# Patient Record
Sex: Female | Born: 1955
Health system: Southern US, Community
[De-identification: ages and names within clinical notes are randomized; demographics above are authoritative.]

## PROBLEM LIST (undated history)

## (undated) DIAGNOSIS — R7303 Prediabetes: Secondary | ICD-10-CM

## (undated) DIAGNOSIS — E785 Hyperlipidemia, unspecified: Secondary | ICD-10-CM

## (undated) DIAGNOSIS — J069 Acute upper respiratory infection, unspecified: Secondary | ICD-10-CM

## (undated) DIAGNOSIS — D649 Anemia, unspecified: Secondary | ICD-10-CM

## (undated) DIAGNOSIS — K589 Irritable bowel syndrome without diarrhea: Secondary | ICD-10-CM

## (undated) DIAGNOSIS — I10 Essential (primary) hypertension: Secondary | ICD-10-CM

## (undated) DIAGNOSIS — T7840XA Allergy, unspecified, initial encounter: Secondary | ICD-10-CM

## (undated) DIAGNOSIS — L309 Dermatitis, unspecified: Secondary | ICD-10-CM

## (undated) DIAGNOSIS — M47817 Spondylosis without myelopathy or radiculopathy, lumbosacral region: Secondary | ICD-10-CM

## (undated) DIAGNOSIS — J45909 Unspecified asthma, uncomplicated: Secondary | ICD-10-CM

## (undated) DIAGNOSIS — F411 Generalized anxiety disorder: Secondary | ICD-10-CM

## (undated) DIAGNOSIS — G932 Benign intracranial hypertension: Secondary | ICD-10-CM

## (undated) DIAGNOSIS — S3992XA Unspecified injury of lower back, initial encounter: Secondary | ICD-10-CM

## (undated) HISTORY — DX: Morbid (severe) obesity due to excess calories: E66.01

## (undated) HISTORY — DX: Unspecified asthma, uncomplicated: J45.909

## (undated) HISTORY — DX: Anemia, unspecified: D64.9

## (undated) HISTORY — DX: Allergy, unspecified, initial encounter: T78.40XA

## (undated) HISTORY — DX: Generalized anxiety disorder: F41.1

## (undated) HISTORY — DX: Spondylosis without myelopathy or radiculopathy, lumbosacral region: M47.817

## (undated) HISTORY — DX: Irritable bowel syndrome, unspecified: K58.9

## (undated) HISTORY — DX: Acute upper respiratory infection, unspecified: J06.9

## (undated) HISTORY — DX: Dermatitis, unspecified: L30.9

## (undated) HISTORY — DX: Benign intracranial hypertension: G93.2

## (undated) HISTORY — DX: Prediabetes: R73.03

## (undated) HISTORY — DX: Essential (primary) hypertension: I10

## (undated) HISTORY — DX: Hyperlipidemia, unspecified: E78.5

## (undated) HISTORY — PX: ABSCESS DRAINAGE: SHX1119

---

## 1998-05-11 ENCOUNTER — Other Ambulatory Visit: Admission: RE | Admit: 1998-05-11 | Discharge: 1998-05-11 | Payer: Self-pay | Admitting: Obstetrics & Gynecology

## 1999-01-11 ENCOUNTER — Ambulatory Visit (HOSPITAL_COMMUNITY): Admission: RE | Admit: 1999-01-11 | Discharge: 1999-01-11 | Payer: Self-pay | Admitting: Neurology

## 1999-01-11 ENCOUNTER — Encounter: Payer: Self-pay | Admitting: Neurology

## 1999-04-14 ENCOUNTER — Encounter: Admission: RE | Admit: 1999-04-14 | Discharge: 1999-07-13 | Payer: Self-pay | Admitting: Neurology

## 1999-07-05 ENCOUNTER — Other Ambulatory Visit: Admission: RE | Admit: 1999-07-05 | Discharge: 1999-07-05 | Payer: Self-pay | Admitting: Obstetrics and Gynecology

## 1999-07-17 ENCOUNTER — Encounter: Admission: RE | Admit: 1999-07-17 | Discharge: 1999-10-15 | Payer: Self-pay | Admitting: Neurology

## 2000-02-21 ENCOUNTER — Encounter: Admission: RE | Admit: 2000-02-21 | Discharge: 2000-05-21 | Payer: Self-pay | Admitting: Neurology

## 2000-02-29 ENCOUNTER — Ambulatory Visit: Admission: RE | Admit: 2000-02-29 | Discharge: 2000-02-29 | Payer: Self-pay | Admitting: Pulmonary Disease

## 2000-07-19 ENCOUNTER — Other Ambulatory Visit: Admission: RE | Admit: 2000-07-19 | Discharge: 2000-07-19 | Payer: Self-pay | Admitting: Obstetrics and Gynecology

## 2001-06-05 ENCOUNTER — Ambulatory Visit (HOSPITAL_COMMUNITY): Admission: RE | Admit: 2001-06-05 | Discharge: 2001-06-05 | Payer: Self-pay | Admitting: Obstetrics and Gynecology

## 2001-06-05 ENCOUNTER — Encounter: Payer: Self-pay | Admitting: Obstetrics and Gynecology

## 2001-08-07 ENCOUNTER — Other Ambulatory Visit: Admission: RE | Admit: 2001-08-07 | Discharge: 2001-08-07 | Payer: Self-pay | Admitting: Obstetrics and Gynecology

## 2002-08-21 ENCOUNTER — Other Ambulatory Visit: Admission: RE | Admit: 2002-08-21 | Discharge: 2002-08-21 | Payer: Self-pay | Admitting: Obstetrics and Gynecology

## 2003-09-10 ENCOUNTER — Other Ambulatory Visit: Admission: RE | Admit: 2003-09-10 | Discharge: 2003-09-10 | Payer: Self-pay | Admitting: Obstetrics and Gynecology

## 2004-10-18 ENCOUNTER — Other Ambulatory Visit: Admission: RE | Admit: 2004-10-18 | Discharge: 2004-10-18 | Payer: Self-pay | Admitting: Obstetrics and Gynecology

## 2004-10-31 ENCOUNTER — Ambulatory Visit: Payer: Self-pay | Admitting: Pulmonary Disease

## 2004-11-22 ENCOUNTER — Ambulatory Visit: Payer: Self-pay | Admitting: Pulmonary Disease

## 2004-11-23 ENCOUNTER — Ambulatory Visit: Payer: Self-pay | Admitting: Pulmonary Disease

## 2005-02-27 ENCOUNTER — Ambulatory Visit: Payer: Self-pay | Admitting: Gastroenterology

## 2005-03-02 ENCOUNTER — Ambulatory Visit: Payer: Self-pay | Admitting: Cardiology

## 2005-03-09 ENCOUNTER — Ambulatory Visit: Payer: Self-pay | Admitting: Gastroenterology

## 2005-03-14 ENCOUNTER — Ambulatory Visit: Payer: Self-pay | Admitting: Gastroenterology

## 2005-05-03 ENCOUNTER — Ambulatory Visit: Payer: Self-pay | Admitting: Pulmonary Disease

## 2005-12-25 ENCOUNTER — Ambulatory Visit: Payer: Self-pay | Admitting: Pulmonary Disease

## 2006-02-05 ENCOUNTER — Ambulatory Visit: Payer: Self-pay | Admitting: Pulmonary Disease

## 2006-02-08 ENCOUNTER — Ambulatory Visit: Payer: Self-pay | Admitting: Pulmonary Disease

## 2006-06-13 ENCOUNTER — Ambulatory Visit: Payer: Self-pay | Admitting: Pulmonary Disease

## 2006-10-29 ENCOUNTER — Ambulatory Visit: Payer: Self-pay | Admitting: Pulmonary Disease

## 2006-10-29 ENCOUNTER — Ambulatory Visit (HOSPITAL_COMMUNITY): Admission: RE | Admit: 2006-10-29 | Discharge: 2006-10-29 | Payer: Self-pay | Admitting: Pulmonary Disease

## 2006-12-12 ENCOUNTER — Ambulatory Visit: Payer: Self-pay | Admitting: Pulmonary Disease

## 2007-02-02 ENCOUNTER — Encounter: Admission: RE | Admit: 2007-02-02 | Discharge: 2007-02-02 | Payer: Self-pay | Admitting: Sports Medicine

## 2007-11-04 ENCOUNTER — Telehealth (INDEPENDENT_AMBULATORY_CARE_PROVIDER_SITE_OTHER): Payer: Self-pay | Admitting: *Deleted

## 2007-11-25 DIAGNOSIS — I10 Essential (primary) hypertension: Secondary | ICD-10-CM | POA: Insufficient documentation

## 2007-11-26 ENCOUNTER — Ambulatory Visit: Payer: Self-pay | Admitting: Pulmonary Disease

## 2007-11-26 DIAGNOSIS — R7309 Other abnormal glucose: Secondary | ICD-10-CM | POA: Insufficient documentation

## 2007-11-26 DIAGNOSIS — F411 Generalized anxiety disorder: Secondary | ICD-10-CM | POA: Insufficient documentation

## 2007-11-26 DIAGNOSIS — R51 Headache: Secondary | ICD-10-CM | POA: Insufficient documentation

## 2007-11-26 DIAGNOSIS — R519 Headache, unspecified: Secondary | ICD-10-CM | POA: Insufficient documentation

## 2007-11-26 DIAGNOSIS — K589 Irritable bowel syndrome without diarrhea: Secondary | ICD-10-CM | POA: Insufficient documentation

## 2007-11-26 DIAGNOSIS — G932 Benign intracranial hypertension: Secondary | ICD-10-CM | POA: Insufficient documentation

## 2007-11-26 DIAGNOSIS — E785 Hyperlipidemia, unspecified: Secondary | ICD-10-CM | POA: Insufficient documentation

## 2007-11-27 ENCOUNTER — Ambulatory Visit: Payer: Self-pay | Admitting: Pulmonary Disease

## 2007-11-29 LAB — CONVERTED CEMR LAB
ALT: 15 units/L (ref 0–35)
AST: 17 units/L (ref 0–37)
Albumin: 3.6 g/dL (ref 3.5–5.2)
Alkaline Phosphatase: 84 units/L (ref 39–117)
BUN: 7 mg/dL (ref 6–23)
Basophils Absolute: 0.2 10*3/uL — ABNORMAL HIGH (ref 0.0–0.1)
Basophils Relative: 2.5 % — ABNORMAL HIGH (ref 0.0–1.0)
Bilirubin, Direct: 0.2 mg/dL (ref 0.0–0.3)
CO2: 30 meq/L (ref 19–32)
Calcium: 9.4 mg/dL (ref 8.4–10.5)
Chloride: 106 meq/L (ref 96–112)
Cholesterol: 224 mg/dL (ref 0–200)
Creatinine, Ser: 0.6 mg/dL (ref 0.4–1.2)
Direct LDL: 134.8 mg/dL
Eosinophils Absolute: 0.1 10*3/uL (ref 0.0–0.6)
Eosinophils Relative: 0.7 % (ref 0.0–5.0)
GFR calc Af Amer: 134 mL/min
GFR calc non Af Amer: 111 mL/min
Glucose, Bld: 110 mg/dL — ABNORMAL HIGH (ref 70–99)
HCT: 36.5 % (ref 36.0–46.0)
HDL: 70.7 mg/dL (ref >39.0)
Hemoglobin: 11.9 g/dL — ABNORMAL LOW (ref 12.0–15.0)
Hgb A1c MFr Bld: 6.2 % — ABNORMAL HIGH (ref 4.6–6.0)
Lymphocytes Relative: 29.5 % (ref 12.0–46.0)
MCHC: 32.6 g/dL (ref 30.0–36.0)
MCV: 90 fL (ref 78.0–100.0)
Monocytes Absolute: 0.3 10*3/uL (ref 0.2–0.7)
Monocytes Relative: 4 % (ref 3.0–11.0)
Neutro Abs: 5.5 10*3/uL (ref 1.4–7.7)
Neutrophils Relative %: 63.3 % (ref 43.0–77.0)
Platelets: 378 10*3/uL (ref 150–400)
Potassium: 4.8 meq/L (ref 3.5–5.1)
RBC: 4.06 M/uL (ref 3.87–5.11)
RDW: 13.1 % (ref 11.5–14.6)
Sodium: 141 meq/L (ref 135–145)
TSH: 0.8 microintl units/mL (ref 0.35–5.50)
Total Bilirubin: 0.8 mg/dL (ref 0.3–1.2)
Total CHOL/HDL Ratio: 3.2
Total Protein: 6.6 g/dL (ref 6.0–8.3)
Triglycerides: 85 mg/dL (ref 0–149)
VLDL: 17 mg/dL (ref 0–40)
WBC: 8.7 10*3/uL (ref 4.5–10.5)

## 2007-12-18 ENCOUNTER — Telehealth (INDEPENDENT_AMBULATORY_CARE_PROVIDER_SITE_OTHER): Payer: Self-pay | Admitting: *Deleted

## 2008-01-23 ENCOUNTER — Ambulatory Visit: Payer: Self-pay | Admitting: Pulmonary Disease

## 2008-02-02 ENCOUNTER — Encounter: Payer: Self-pay | Admitting: Pulmonary Disease

## 2008-09-13 ENCOUNTER — Ambulatory Visit: Payer: Self-pay | Admitting: Pulmonary Disease

## 2008-09-23 ENCOUNTER — Ambulatory Visit: Payer: Self-pay | Admitting: Adult Health

## 2008-09-28 LAB — CONVERTED CEMR LAB
BUN: 9 mg/dL (ref 6–23)
CO2: 29 meq/L (ref 19–32)
CRP, High Sensitivity: 13 — ABNORMAL HIGH (ref 0.00–5.00)
Calcium: 9.1 mg/dL (ref 8.4–10.5)
Chloride: 108 meq/L (ref 96–112)
Cholesterol: 207 mg/dL (ref 0–200)
Creatinine, Ser: 0.6 mg/dL (ref 0.4–1.2)
Direct LDL: 137.8 mg/dL
GFR calc Af Amer: 134 mL/min
GFR calc non Af Amer: 111 mL/min
Glucose, Bld: 102 mg/dL — ABNORMAL HIGH (ref 70–99)
HDL: 58.3 mg/dL (ref >39.0)
Hgb A1c MFr Bld: 6.2 % — ABNORMAL HIGH (ref 4.6–6.0)
Potassium: 4 meq/L (ref 3.5–5.1)
Sodium: 142 meq/L (ref 135–145)
Total CHOL/HDL Ratio: 3.6
Triglycerides: 73 mg/dL (ref 0–149)
VLDL: 15 mg/dL (ref 0–40)

## 2008-10-06 ENCOUNTER — Telehealth (INDEPENDENT_AMBULATORY_CARE_PROVIDER_SITE_OTHER): Payer: Self-pay | Admitting: *Deleted

## 2009-01-17 ENCOUNTER — Ambulatory Visit: Payer: Self-pay | Admitting: Pulmonary Disease

## 2009-01-17 DIAGNOSIS — M47817 Spondylosis without myelopathy or radiculopathy, lumbosacral region: Secondary | ICD-10-CM | POA: Insufficient documentation

## 2009-01-28 ENCOUNTER — Encounter: Payer: Self-pay | Admitting: Pulmonary Disease

## 2009-01-28 ENCOUNTER — Ambulatory Visit: Payer: Self-pay | Admitting: Pulmonary Disease

## 2009-01-31 LAB — CONVERTED CEMR LAB
ALT: 15 units/L (ref 0–35)
AST: 19 units/L (ref 0–37)
Albumin: 3.8 g/dL (ref 3.5–5.2)
Alkaline Phosphatase: 83 units/L (ref 39–117)
BUN: 10 mg/dL (ref 6–23)
Basophils Absolute: 0.1 10*3/uL (ref 0.0–0.1)
Basophils Relative: 1.2 % (ref 0.0–3.0)
Bilirubin, Direct: 0.1 mg/dL (ref 0.0–0.3)
CO2: 30 meq/L (ref 19–32)
Calcium: 9.1 mg/dL (ref 8.4–10.5)
Chloride: 104 meq/L (ref 96–112)
Cholesterol: 222 mg/dL — ABNORMAL HIGH (ref 0–200)
Creatinine, Ser: 0.6 mg/dL (ref 0.4–1.2)
Direct LDL: 134.6 mg/dL
Eosinophils Absolute: 0.1 10*3/uL (ref 0.0–0.7)
Eosinophils Relative: 1.3 % (ref 0.0–5.0)
GFR calc non Af Amer: 133.89 mL/min (ref >60)
Glucose, Bld: 110 mg/dL — ABNORMAL HIGH (ref 70–99)
HCT: 36.1 % (ref 36.0–46.0)
HDL: 69.8 mg/dL (ref >39.00)
Hemoglobin: 12.4 g/dL (ref 12.0–15.0)
Hgb A1c MFr Bld: 6.4 % (ref 4.6–6.5)
Lymphocytes Relative: 27 % (ref 12.0–46.0)
Lymphs Abs: 2.8 10*3/uL (ref 0.7–4.0)
MCHC: 34.3 g/dL (ref 30.0–36.0)
MCV: 89 fL (ref 78.0–100.0)
Monocytes Absolute: 0.6 10*3/uL (ref 0.1–1.0)
Monocytes Relative: 5.4 % (ref 3.0–12.0)
Neutro Abs: 6.8 10*3/uL (ref 1.4–7.7)
Neutrophils Relative %: 65.1 % (ref 43.0–77.0)
Platelets: 419 10*3/uL — ABNORMAL HIGH (ref 150.0–400.0)
Potassium: 4 meq/L (ref 3.5–5.1)
RBC: 4.05 M/uL (ref 3.87–5.11)
RDW: 13.8 % (ref 11.5–14.6)
Sodium: 141 meq/L (ref 135–145)
TSH: 1.4 microintl units/mL (ref 0.35–5.50)
Total Bilirubin: 0.6 mg/dL (ref 0.3–1.2)
Total CHOL/HDL Ratio: 3
Total Protein: 7.5 g/dL (ref 6.0–8.3)
Triglycerides: 92 mg/dL (ref 0.0–149.0)
VLDL: 18.4 mg/dL (ref 0.0–40.0)
WBC: 10.4 10*3/uL (ref 4.5–10.5)

## 2009-05-06 ENCOUNTER — Ambulatory Visit (HOSPITAL_COMMUNITY): Admission: RE | Admit: 2009-05-06 | Discharge: 2009-05-06 | Payer: Self-pay | Admitting: Sports Medicine

## 2009-07-11 ENCOUNTER — Telehealth: Payer: Self-pay | Admitting: Pulmonary Disease

## 2009-09-12 ENCOUNTER — Encounter: Payer: Self-pay | Admitting: Pulmonary Disease

## 2009-09-20 ENCOUNTER — Encounter: Payer: Self-pay | Admitting: Pulmonary Disease

## 2009-09-21 ENCOUNTER — Telehealth: Payer: Self-pay | Admitting: Pulmonary Disease

## 2009-10-04 ENCOUNTER — Encounter: Payer: Self-pay | Admitting: Pulmonary Disease

## 2009-10-31 ENCOUNTER — Ambulatory Visit: Payer: Self-pay | Admitting: Pulmonary Disease

## 2009-11-03 ENCOUNTER — Ambulatory Visit: Payer: Self-pay | Admitting: Pulmonary Disease

## 2009-11-03 ENCOUNTER — Ambulatory Visit: Payer: Self-pay | Admitting: Gastroenterology

## 2009-11-03 DIAGNOSIS — F449 Dissociative and conversion disorder, unspecified: Secondary | ICD-10-CM | POA: Insufficient documentation

## 2009-11-06 LAB — CONVERTED CEMR LAB
ALT: 13 units/L (ref 0–35)
AST: 16 units/L (ref 0–37)
Albumin: 3.8 g/dL (ref 3.5–5.2)
Alkaline Phosphatase: 89 units/L (ref 39–117)
BUN: 11 mg/dL (ref 6–23)
Basophils Absolute: 0 10*3/uL (ref 0.0–0.1)
Basophils Relative: 0.1 % (ref 0.0–3.0)
Bilirubin Urine: NEGATIVE
Bilirubin, Direct: 0 mg/dL (ref 0.0–0.3)
CO2: 27 meq/L (ref 19–32)
Calcium: 9.1 mg/dL (ref 8.4–10.5)
Chloride: 102 meq/L (ref 96–112)
Cholesterol: 206 mg/dL — ABNORMAL HIGH (ref 0–200)
Creatinine, Ser: 0.6 mg/dL (ref 0.4–1.2)
Direct LDL: 129.5 mg/dL
Eosinophils Absolute: 0.1 10*3/uL (ref 0.0–0.7)
Eosinophils Relative: 0.6 % (ref 0.0–5.0)
GFR calc non Af Amer: 133.51 mL/min (ref >60)
Glucose, Bld: 103 mg/dL — ABNORMAL HIGH (ref 70–99)
HCT: 38.1 % (ref 36.0–46.0)
HDL: 65.9 mg/dL (ref >39.00)
Hemoglobin, Urine: NEGATIVE
Hemoglobin: 12.6 g/dL (ref 12.0–15.0)
Ketones, ur: NEGATIVE mg/dL
Leukocytes, UA: NEGATIVE
Lymphocytes Relative: 31.2 % (ref 12.0–46.0)
Lymphs Abs: 3.5 10*3/uL (ref 0.7–4.0)
MCHC: 32.9 g/dL (ref 30.0–36.0)
MCV: 92.7 fL (ref 78.0–100.0)
Monocytes Absolute: 0.6 10*3/uL (ref 0.1–1.0)
Monocytes Relative: 5.6 % (ref 3.0–12.0)
Neutro Abs: 7.1 10*3/uL (ref 1.4–7.7)
Neutrophils Relative %: 62.5 % (ref 43.0–77.0)
Nitrite: NEGATIVE
Platelets: 378 10*3/uL (ref 150.0–400.0)
Potassium: 4.1 meq/L (ref 3.5–5.1)
RBC: 4.11 M/uL (ref 3.87–5.11)
RDW: 13.8 % (ref 11.5–14.6)
Sodium: 136 meq/L (ref 135–145)
Specific Gravity, Urine: 1.025 (ref 1.000–1.030)
TSH: 1.4 microintl units/mL (ref 0.35–5.50)
Total Bilirubin: 0.7 mg/dL (ref 0.3–1.2)
Total CHOL/HDL Ratio: 3
Total Protein, Urine: NEGATIVE mg/dL
Total Protein: 7.8 g/dL (ref 6.0–8.3)
Triglycerides: 77 mg/dL (ref 0.0–149.0)
Urine Glucose: NEGATIVE mg/dL
Urobilinogen, UA: 0.2 (ref 0.0–1.0)
VLDL: 15.4 mg/dL (ref 0.0–40.0)
WBC: 11.3 10*3/uL — ABNORMAL HIGH (ref 4.5–10.5)
pH: 6 (ref 5.0–8.0)

## 2009-11-09 ENCOUNTER — Telehealth: Payer: Self-pay | Admitting: Gastroenterology

## 2009-11-09 ENCOUNTER — Ambulatory Visit: Payer: Self-pay | Admitting: Gastroenterology

## 2009-11-10 ENCOUNTER — Ambulatory Visit: Payer: Self-pay | Admitting: Cardiology

## 2009-11-11 ENCOUNTER — Encounter: Payer: Self-pay | Admitting: Gastroenterology

## 2009-11-15 ENCOUNTER — Telehealth (INDEPENDENT_AMBULATORY_CARE_PROVIDER_SITE_OTHER): Payer: Self-pay | Admitting: *Deleted

## 2009-11-17 ENCOUNTER — Encounter: Payer: Self-pay | Admitting: Adult Health

## 2009-11-17 ENCOUNTER — Ambulatory Visit: Payer: Self-pay | Admitting: Pulmonary Disease

## 2010-03-13 ENCOUNTER — Telehealth (INDEPENDENT_AMBULATORY_CARE_PROVIDER_SITE_OTHER): Payer: Self-pay | Admitting: *Deleted

## 2010-03-13 ENCOUNTER — Encounter: Payer: Self-pay | Admitting: Pulmonary Disease

## 2010-03-15 ENCOUNTER — Encounter: Payer: Self-pay | Admitting: Pulmonary Disease

## 2010-04-11 ENCOUNTER — Ambulatory Visit: Payer: Self-pay | Admitting: Pulmonary Disease

## 2010-04-12 ENCOUNTER — Encounter: Payer: Self-pay | Admitting: Pulmonary Disease

## 2010-06-22 ENCOUNTER — Telehealth: Payer: Self-pay | Admitting: Pulmonary Disease

## 2010-08-15 ENCOUNTER — Ambulatory Visit: Payer: Self-pay | Admitting: Pulmonary Disease

## 2010-08-16 ENCOUNTER — Ambulatory Visit: Payer: Self-pay | Admitting: Pulmonary Disease

## 2010-08-18 DIAGNOSIS — I872 Venous insufficiency (chronic) (peripheral): Secondary | ICD-10-CM | POA: Insufficient documentation

## 2010-08-18 LAB — CONVERTED CEMR LAB
ALT: 12 units/L (ref 0–35)
AST: 17 units/L (ref 0–37)
Albumin: 3.8 g/dL (ref 3.5–5.2)
Alkaline Phosphatase: 74 units/L (ref 39–117)
BUN: 19 mg/dL (ref 6–23)
Basophils Absolute: 0.1 10*3/uL (ref 0.0–0.1)
Basophils Relative: 0.9 % (ref 0.0–3.0)
Bilirubin, Direct: 0 mg/dL (ref 0.0–0.3)
CO2: 27 meq/L (ref 19–32)
Calcium: 9 mg/dL (ref 8.4–10.5)
Chloride: 104 meq/L (ref 96–112)
Cholesterol: 228 mg/dL — ABNORMAL HIGH (ref 0–200)
Creatinine, Ser: 0.6 mg/dL (ref 0.4–1.2)
Direct LDL: 133.5 mg/dL
Eosinophils Absolute: 0.1 10*3/uL (ref 0.0–0.7)
Eosinophils Relative: 1 % (ref 0.0–5.0)
GFR calc non Af Amer: 128.18 mL/min (ref >60)
Glucose, Bld: 79 mg/dL (ref 70–99)
HCT: 37.5 % (ref 36.0–46.0)
HDL: 76.4 mg/dL (ref >39.00)
Hemoglobin: 12.6 g/dL (ref 12.0–15.0)
Hgb A1c MFr Bld: 6 % (ref 4.6–6.5)
Lymphocytes Relative: 38.2 % (ref 12.0–46.0)
Lymphs Abs: 3.1 10*3/uL (ref 0.7–4.0)
MCHC: 33.7 g/dL (ref 30.0–36.0)
MCV: 92.3 fL (ref 78.0–100.0)
Monocytes Absolute: 0.6 10*3/uL (ref 0.1–1.0)
Monocytes Relative: 7.3 % (ref 3.0–12.0)
Neutro Abs: 4.3 10*3/uL (ref 1.4–7.7)
Neutrophils Relative %: 52.6 % (ref 43.0–77.0)
Platelets: 340 10*3/uL (ref 150.0–400.0)
Potassium: 4.5 meq/L (ref 3.5–5.1)
RBC: 4.06 M/uL (ref 3.87–5.11)
RDW: 14.2 % (ref 11.5–14.6)
Sodium: 137 meq/L (ref 135–145)
TSH: 0.93 microintl units/mL (ref 0.35–5.50)
Total Bilirubin: 0.5 mg/dL (ref 0.3–1.2)
Total CHOL/HDL Ratio: 3
Total Protein: 6.9 g/dL (ref 6.0–8.3)
Triglycerides: 64 mg/dL (ref 0.0–149.0)
VLDL: 12.8 mg/dL (ref 0.0–40.0)
Vit D, 25-Hydroxy: 37 ng/mL (ref 30–89)
WBC: 8.1 10*3/uL (ref 4.5–10.5)

## 2010-10-17 ENCOUNTER — Encounter: Payer: Self-pay | Admitting: Pulmonary Disease

## 2010-11-12 ENCOUNTER — Encounter: Payer: Self-pay | Admitting: Sports Medicine

## 2010-11-13 ENCOUNTER — Encounter: Payer: Self-pay | Admitting: Sports Medicine

## 2010-11-21 NOTE — Assessment & Plan Note (Signed)
Summary: rov 4-6 months///kp   Primary Care Provider:  Alroy Dust, MD   CC:  4 month ROV & review of mult medical problems....  History of Present Illness: [name] here for a yearly follow up visit... she has mult medical problems including: HBP, Chol, CWP, HA's w/ pseudotumor cerebri, and anxiety...    ~  Mar10:  she states that she is doing well and wants her meds refilled for 2010... she states that she tried the Simvastatin in 2009, but was intol due to headaches and insomnia, therefore she returned to her diet efforts alone...   ~  October 31, 2009:  she reports BP controlled on meds;  intol to Pravastatin w/ headaches therefore stopped this med on her own & doesn't want to try other statins or lipid clinic-  she will continue diet alone & work on weight reduction;  notes some back discomfort- improved on Robaxin + Tramadol;  finally notes some dysphagia in upper esoph area despite Zantac Rx, Xanax seems to help (?globus), but needs GI eval-  try Dexilant & refer to DrPatterson...   ~  April 11, 2010:  she had GI eval from [facility] 1/11 w/ EGD showing benign gastric polyps, & subseq CT Chest (?extrinsic compression on esoph- neg CT- NAD, but coronary calcif seen)...  labs reviewed & we decided to f/u on next OV... she has done a fab job w/ diet & decr 17# to 223# today.   ~  August 15, 2010:   she has lost 5# more down to 218# today... saw Derm DrJordan recently w/ seb dermatitis & given Doryx, Olux-E foam, Zinc shampoo, & Clobetasol shampoo... BP controlled on meds;  trying to improve lipids w/ diet alone as she has been intol to all statine- offered low dose intermittent Crestor but she declines;  BS improved w/ wt reduction;  refills written today, she declines flu shot.    Current Problems:   HYPERTENSION (ICD-401.9) - controlled on ATACAND 32mg /d and FUROSEMIDE 40mg /d... BP today= 126/84 & taking meds regularly/ tolerating well... checks BP occas at home and usually runs 120's/  80's... denies HA, fatigue, visual changes, CP, palipit, dizziness, syncope, dyspnea, edema, etc... we discussed the importance of low sodium & weight reduction for her BP control.  Hx of CHEST PAIN UNSPECIFIED (ICD-786.50) - on ASA 81mg /d... prev hx CWP.Marland Kitchen.   ~  baseline EKG = NSR, WNL.Marland Kitchen.  ~  CT Chest 1/11 showed incidental calcif in coronary art, otherw CT was neg...  VENOUS INSUFFICIENCY (ICD-459.81) - she has trace edema & knows to elim salt, elevate legs, wear support hose, & take her Lasix.  HYPERLIPIDEMIA (ICD-272.4) - on diet Rx alone... she tried Simv40 in 2009 & Prav40 in 2010 but INTOL w/ HA & insomnia... refuses other statin meds or Lipid Clinic help...  ~  FLP 4/07 showed TChol 191, TG 70, HDL 61, LDL 116  ~  FLP 2/09 showed TChol 224, Tg 85, HDL 71, LDL 135  ~  FLP 12/09 showed TChol 207, TG 73, HDL 58, LDL 138  ~  FLP 4/10 showed Tchol 222, TG 92, HDL 70, LDL 135  ~  FLP 1/11 showed TChol 206, TG 77, HDL 66, LDL 130... not at goal, declines LipidClinic.  ~  FLP 10/11 showed TChol 228, TG 64, HDL 76, LDL 134... not at goal, declines low dose intermit Crestor.  DIABETES MELLITUS, BORDERLINE (ICD-790.29) - FBS 90-115 over the last few yrs... diet Rx.  ~  labs 2/09 showed  BS= 110, HgA1c= 6.2.Marland Kitchen. rec> diet Rx, get wt down.  ~  labs 12/09 showed BS= 102, HgA1c= 6.2  ~  labs 4/10 showed BS= 110, A1c= 6.4  ~  labs 1/11 showed BS= 103... keep up the godd work.  ~  labs 10/11 showed BS= 79, A1c= 6.0  MORBID OBESITY (ICD-278.01) - we have discussed Bariatric Surg and she has several relatives who have had surg... "I'm afraid"- pt encouraged to seek counsel at [facility].  ~  weight 2/09 = 247#... 5'1" tall... BMI=47...   ~  weight 3/10 = 256#... we reviewed diet + exercise therapy...  ~  weight 1/11 = 240#  ~  weight 6/11 = 223#... great job!!!  OTHER DYSPHAGIA [phone]) - c/o upper esoph dysphagia w/ discomfort... no response to Zantac, but sl better w/ Alpraz...  referred to GI, DrPatterson 1/11 w/ EGD showing mult benign gastric polyps, ? extrinsic compression> subseq CT Chest was neg x for coronary calcif seen... globus symptoms improved on HYDROXYZINE 25mg  Prn & she continues on ZANTAC vs PRILOSEC...  IRRITABLE BOWEL SYNDROME (ICD-564.1) - last colon 5/06 by DrPatterson... normal exam.  SPONDYLOSIS, LUMBAR (ICD-721.3) - she had MRI 1/08 by DrKramer showing bulging discs and some facet arthropathy... takes ROBAXIN 500mg  Qid Prn, and TRAMADOL 50mg  Qid Prn...  Hx of HEADACHE (ICD-784.0) - eval in the [facility] in 2003... she uses Ibuprofen Prn...  Hx of PSEUDOTUMOR CEREBRI (ICD-348.2) - prev followed by Deland Pretty and DrMartin at [place], plus DrLove in [place]...  ~  Mar10: pt had [facility] check [facility] and was told OK...  ANXIETY (ICD-300.00) - has used XANAX 0.5mg  as needed in past...  ALOPECIA (ICD-704.00) & Seborrheic Dermatitis treated by DrJordan for Derm...   Preventive Screening-Counseling & Management  Alcohol-Tobacco     Smoking Status: never  Allergies: 1)  ! Codeine 2)  ! Penicillin 3)  ! Simvastatin  Comments:  Nurse/Medical Assistant: The patient's medications and allergies were reviewed with the patient and were updated in the Medication and Allergy Lists.  Past History:  Past Medical History: GLOBUS HYSTERICUS (ICD-300.11) HYPERTENSION (ICD-401.9) Hx of CHEST PAIN UNSPECIFIED (ICD-786.50) VENOUS INSUFFICIENCY (ICD-459.81) HYPERLIPIDEMIA (ICD-272.4) DIABETES MELLITUS, BORDERLINE (ICD-790.29) MORBID OBESITY (ICD-278.01) OTHER DYSPHAGIA (ICD-787.29) IRRITABLE BOWEL SYNDROME (ICD-564.1) ADENOCARCINOMA, COLON, FAMILY HX (ICD-V16.0) SPONDYLOSIS, LUMBAR (ICD-721.3) Hx of HEADACHE (ICD-784.0) Hx of PSEUDOTUMOR CEREBRI (ICD-348.2) ANXIETY (ICD-300.00) ALOPECIA (ICD-704.00)  Past Surgical History: CSection 1977 w/ wound complication  Family History: Reviewed history from 11/03/2009 and no changes  required. Father alive age 28 w/ hx HBP, on dialysis, and DM  Mother, Migdalia Dk, age 46, hx HBP DM Arrhythmia Chol etc... 3 Siblings- all brothers a/w... Family History of Colon Cancer:MGM Family History of Breast Cancer:Mother   Social History: Reviewed history from 11/03/2009 and no changes required. Divorced 1 daughter, age 41 & 2 grandchildren... Never smoked No alcohol Retired from [employer], works for [employer]- 3rd shift. Daily Caffeine Use: 3 daily  Illicit Drug Use - no  Review of Systems      See HPI       The patient complains of dyspnea on exertion.  The patient denies anorexia, fever, weight loss, weight gain, vision loss, decreased hearing, hoarseness, chest pain, syncope, peripheral edema, prolonged cough, headaches, hemoptysis, abdominal pain, melena, hematochezia, severe indigestion/heartburn, hematuria, incontinence, muscle weakness, suspicious skin lesions, transient blindness, difficulty walking, depression, unusual weight change, abnormal bleeding, enlarged lymph nodes, and angioedema.    Vital Signs:  Patient profile:   54  year old female Height:      61 inches Weight:      217.38 pounds BMI:     41.22 O2 Sat:      98 % on Room air Temp:     98.8 degrees F oral Pulse rate:   86 / minute BP sitting:   126 / 84  (right arm) Cuff size:   regular  Vitals Entered By: Randell Loop CMA (August 15, 2010 1:57 PM)  O2 Sat at Rest %:  98 O2 Flow:  Room air CC: 4 month ROV & review of mult medical problems... Is Patient Diabetic? No Pain Assessment Patient in pain? no      Comments no changes in meds today   Physical Exam  Additional Exam:  WD, Obese, 54 y/o BF in NAD... GENERAL:  Alert & oriented; pleasant & cooperative. HEENT:  [place]/AT, EOM-wnl, PERRLA, EACs-clear, TMs-wnl, NOSE-clear, THROAT-clear & wnl. NECK:  Supple w/ fairROM; no JVD; normal carotid impulses w/o bruits; no thyromegaly or nodules palpated; no lymphadenopathy. CHEST:  Clear to P &  A; without wheezes/ rales/ or rhonchi. HEART:  Regular Rhythm; without murmurs/ rubs/ or gallops. ABDOMEN:  Obese, soft & nontender; + panniculus; normal bowel sounds; no organomegaly or masses detected. EXT:  mild arthritic deformities bilat knees; no varicose veins/ +venous insuffic/ tr edema. NEURO:  CN's intact;  no focal neuro deficits... DERM:  No lesions noted; no rash etc...    MISC. Report  Procedure date:  08/16/2010  Findings:      Lipid Panel (LIPID)   Cholesterol          [H]  228 mg/dL                   3-818   Triglycerides             64.0 mg/dL                  2.9-937.1   HDL                       69.67 mg/dL                 >89.38 Cholesterol LDL - Direct                             133.5 mg/dL   BMP (METABOL)   Sodium                    137 mEq/L                   135-145   Potassium                 4.5 mEq/L                   3.5-5.1   Chloride                  104 mEq/L                   96-112   Carbon Dioxide            27 mEq/L                    19-32   Glucose  79 mg/dL                    33-29   BUN                       19 mg/dL                    5-18   Creatinine                0.6 mg/dL                   8.4-1.6   Calcium                   9.0 mg/dL                   6.0-63.0   GFR                       128.18 mL/min               >60   Hepatic/Liver Function Panel (HEPATIC)   Total Bilirubin           0.5 mg/dL                   1.6-0.1   Direct Bilirubin          0.0 mg/dL                   0.9-3.2   Alkaline Phosphatase      74 U/L                      39-117   AST                       17 U/L                      0-37   ALT                       12 U/L                      0-35   Total Protein             6.9 g/dL                    3.5-5.7   Albumin                   3.8 g/dL                    3.2-2.0  Comments:      CBC Platelet w/Diff (CBCD)   White Cell Count          8.1 K/uL                    4.5-10.5   Red Cell  Count            4.06 Mil/uL                 3.87-5.11   Hemoglobin                12.6 g/dL  12.0-15.0   Hematocrit                37.5 %                      36.0-46.0   MCV                       92.3 fl                     78.0-100.0   Platelet Count            340.0 K/uL                  150.0-400.0   Neutrophil %              52.6 %                      43.0-77.0   Lymphocyte %              38.2 %                      12.0-46.0   Monocyte %                7.3 %                       3.0-12.0   Eosinophils%              1.0 %                       0.0-5.0   Basophils %               0.9 %                       0.0-3.0  TSH (TSH)   FastTSH                   0.93 uIU/mL                 0.35-5.50   Hemoglobin A1C (A1C)   Hemoglobin A1C            6.0 %                       4.6-6.5  Vitamin D (25-Hydroxy) (96295)  Vitamin D (25-Hydroxy)                             37 ng/mL                    30-89   Impression & Recommendations:  Problem # 1:  HYPERTENSION (ICD-401.9) Controlled>  same meds... keep up the wt reduction! Her updated medication list for this problem includes:    Atacand 32 Mg Tabs (Candesartan cilexetil) .Marland Kitchen... Take 1 tablet by mouth once a day    Furosemide 40 Mg Tabs (Furosemide) .Marland Kitchen... 1 tab by mouth once daily  Problem # 2:  HYPERLIPIDEMIA (ICD-272.4) Not at goal on diet alone, but refuses statin Rx & her HDL is good... continue diet efforts.  Problem # 3:  DIABETES MELLITUS, BORDERLINE (ICD-790.29) Her BS & A1c are normal>  everything improved w/ wt reduction...  Problem # 4:  MORBID OBESITY (ICD-278.01) Keep  up the good work w/ weight loss program...  Problem # 5:  OTHER DYSPHAGIA (ICD-787.29) GI is stable>  mild globus symptoms Rx w/ Hydroxyzine Prn... continue Prilosec PPI, etc...  Problem # 6:  OTHER MEDICAL PROBLEMS AS NOTED>>> Ortho, Neuro, Anxiety>  stable on Prn meds as noted...  Complete Medication List: 1)  Adult Aspirin Low  Strength 81 Mg Tbdp (Aspirin) .Marland Kitchen.. 1 tab daily 2)  Atacand 32 Mg Tabs (Candesartan cilexetil) .... Take 1 tablet by mouth once a day 3)  Furosemide 40 Mg Tabs (Furosemide) .Marland Kitchen.. 1 tab by mouth once daily 4)  Zantac 75 75 Mg Tabs (Ranitidine hcl) .... Take 1-2 tabs as needed for stomach acid.Marland KitchenMarland Kitchen 5)  Tramadol Hcl 50 Mg Tabs (Tramadol hcl) .... Take one tablet by mouth four times daily as needed for pain 6)  Methocarbamol 500 Mg Tabs (Methocarbamol) .... Take one tablet by mouth four times daily as needed for muscle spasms 7)  Calcium 600 Mg Tabs (Calcium) .... Take 1 tablet by mouth once a day 8)  Multivitamins Tabs (Multiple vitamin) .... Take 1 tablet by mouth once a day 9)  Alprazolam 0.5 Mg Tabs (Alprazolam) .... Take 1/2 to 1 tab by mouth up to three times a day as needed... 10)  Hydroxyzine Hcl 25 Mg Tabs (Hydroxyzine hcl) .... Take one tablet by mouth every 4 hours as needed for itching  Patient Instructions: 1)  Today we updated your med list- see below.... 2)  we refilled your meds per request... 3)  Please return to our lab in the AM for your FASTING blood work... 4)  Keep up the good work w/ diet. exercise, & weight reduction.Marland KitchenMarland Kitchen 5)  Call for any questions.Marland KitchenMarland Kitchen 6)  Please schedule a follow-up appointment in 6 months. Prescriptions: ALPRAZOLAM 0.5 MG TABS (ALPRAZOLAM) take 1/2 to 1 tab by mouth up to three times a day as needed...  #100 x 4   Entered by:   Randell Loop CMA   Authorized by:   Michele Mcalpine MD   Signed by:   Randell Loop CMA on 08/15/2010   Method used:   Print then Give to Patient   RxID:   [id number] METHOCARBAMOL 500 MG TABS (METHOCARBAMOL) take one tablet by mouth four times daily as needed for muscle spasms  #100 x 6   Entered and Authorized by:   Michele Mcalpine MD   Signed by:   Michele Mcalpine MD on 08/15/2010   Method used:   Print then Give to Patient   RxID:   [id number] TRAMADOL HCL 50 MG TABS (TRAMADOL HCL) take one tablet by mouth four times daily  as needed for pain  #100 x 6   Entered and Authorized by:   Michele Mcalpine MD   Signed by:   Michele Mcalpine MD on 08/15/2010   Method used:   Print then Give to Patient   RxID:   [id number] FUROSEMIDE 40 MG  TABS (FUROSEMIDE) 1 tab by mouth once daily  #90 x 4   Entered and Authorized by:   Michele Mcalpine MD   Signed by:   Michele Mcalpine MD on 08/15/2010   Method used:   Print then Give to Patient   RxID:   [id number] ATACAND 32 MG TABS (CANDESARTAN CILEXETIL) Take 1 tablet by mouth once a day  #90 x 4   Entered and Authorized by:   Michele Mcalpine MD   Signed by:   Michele Mcalpine MD  on 08/15/2010   Method used:   Print then Give to Patient   RxID:   [id number]

## 2010-11-21 NOTE — Assessment & Plan Note (Signed)
Summary: 6 MONTH FOLLOW UP/RSC FROM 7-13/LA   Primary Care Provider:  Alroy Dust, MD   CC:  5 month ROV & review....  History of Present Illness: [name] here for a yearly follow up visit... she has mult medical problems including: HBP, Chol, CWP, HA's w/ pseudotumor cerebri, and anxiety...    ~  Mar10:  she states that she is doing well and wants her meds refilled for 2010... she states that she tried the Simvastatin in 2009, but was intol due to headaches and insomnia, therefore she returned to her diet efforts alone...   ~  October 31, 2009:  she reports BP controlled on meds;  intol to Pravastatin w/ headaches therefore stopped this med on her own & doesn't want to try other statins or lipid clinic-  she will continue diet alone & work on weight reduction;  notes some back discomfort- improved on Robaxin + Tramadol;  finally notes some dysphagia in upper esoph area despite Zantac Rx, Xanax seems to help, but needs GI eval-  try Dexilant & refer to DrPatterson...   ~  April 11, 2010:  she had GI eval from [facility] 1/11 w/ EGD showing benign gastric polyps, & subseq CT Chest (?extrinsic compression on esoph- neg CT- NAD, but coronary calcif seen)...  labs reviewed & we decided to f/u on next OV... she has done a fab job w/ diet & decr 17# to 223# today.    Current Problems:   HYPERTENSION (ICD-401.9) - controlled on ATACAND 32mg /d and FUROSEMIDE 40mg /d... BP today= 118/84 & taking meds regularly/ tolerating well... checks BP occas at home and usually runs 120's/ 80's... denies HA, fatigue, visual changes, CP, palipit, dizziness, syncope, dyspnea, edema, etc... we discussed the importance of low sodium & weight reduction for her BP control.  Hx of CHEST PAIN UNSPECIFIED (ICD-786.50) - on ASA 81mg /d... prev hx CWP.Marland Kitchen.   ~  baseline EKG = NSR, WNL.Marland Kitchen.  ~  CT Chest 1/11 showed incidental calcif in coronary art, otherw CT was neg...  HYPERLIPIDEMIA (ICD-272.4) - on diet Rx alone... she  tried Simv40 in 2009 & Pravastatin in 2010 but INTOL w/ HA & insomnia... refuses other statin meds or Lipid Clinic help...  ~  FLP 4/07 showed TChol 191, TG 70, HDL 61, LDL 116  ~  FLP 2/09 showed TChol 224, Tg 85, HDL 71, LDL 135  ~  FLP 12/09 showed TChol 207, TG 73, HDL 58, LDL 138  ~  FLP 4/10 showed Tchol 222, TG 92, HDL 70, LDL 135  ~  FLP 1/11 showed TChol 206, TG 77, HDL 66, LDL 130  DIABETES MELLITUS, BORDERLINE (ICD-790.29) - FBS 90-115 over the last few yrs... diet Rx.  ~  labs 2/09 showed BS= 110, HgA1c= 6.2.Marland Kitchen. rec> diet Rx, get wt down.  ~  labs 12/09 showed BS= 102, HgA1c= 6.2  ~  labs 4/10 showed BS= 110, A1c= 6.4  ~  labs 1/11 showed BS= 103  MORBID OBESITY (ICD-278.01) - we have discussed Bariatric Surg and she has several relatives who have had surg... "I'm afraid"- pt encouraged to seek counsel at [facility].  ~  weight 2/09 = 247#... 5'1" tall... BMI=47...   ~  weight 3/10 = 256#... we reviewed diet + exercise therapy...  ~  weight 1/11 = 240#  ~  weight 6/11 = 223#... great job!!!  OTHER DYSPHAGIA [phone]) - c/o upper esoph dysphagia w/ discomfort... no response to Zantac, but sl better w/ Alpraz... referred  to GI, DrPatterson 1/11 w/ EGD showing mult benign gastric polyps, ? extrinsic compression> subseq CT Chest was neg x for coronary calcif seen... symptoms improved & she continues on ZANTAC vs PRILOSEC...  IRRITABLE BOWEL SYNDROME (ICD-564.1) - last colon 5/06 by DrPatterson... normal exam.  SPONDYLOSIS, LUMBAR (ICD-721.3) - she had MRI 1/08 by DrKramer showing bulging discs and some facet arthropathy... takes ROBAXIN 500mg  Qid Prn, and TRAMADOL 50mg  Qid Prn...  Hx of HEADACHE (ICD-784.0) - eval in the [facility] in 2003... she uses Ibuprofen Prn...  Hx of PSEUDOTUMOR CEREBRI (ICD-348.2) - prev followed by Deland Pretty and DrMartin at [place], plus DrLove in [place]...  ~  Mar10: pt had eye check [facility] and was told OK...  ANXIETY  (ICD-300.00) - has used XANAX 0.5mg  as needed in past...   Preventive Screening-Counseling & Management  Alcohol-Tobacco     Smoking Status: never  Allergies: 1)  ! Codeine 2)  ! Penicillin 3)  ! Simvastatin  Comments:  Nurse/Medical Assistant: The patient's medications and allergies were reviewed with the patient and were updated in the Medication and Allergy Lists.  Past History:  Past Medical History: HYPERTENSION (ICD-401.9) Hx of CHEST PAIN UNSPECIFIED (ICD-786.50) HYPERLIPIDEMIA (ICD-272.4) DIABETES MELLITUS, BORDERLINE (ICD-790.29) MORBID OBESITY (ICD-278.01) OTHER DYSPHAGIA (ICD-787.29) - gastric polyps on EGD 1/11 IRRITABLE BOWEL SYNDROME (ICD-564.1) SPONDYLOSIS, LUMBAR (ICD-721.3) Hx of HEADACHE (ICD-784.0) Hx of PSEUDOTUMOR CEREBRI (ICD-348.2) ANXIETY (ICD-300.00) ALOPECIA (ICD-704.00)  Past Surgical History: CSection 1977 w/ wound complication  Family History: Reviewed history from 11/03/2009 and no changes required. Father alive age 68 w/ hx HBP, on dialysis, and DM  Mother, Migdalia Dk, age 45, hx HBP DM Arrhythmia Chol etc... 3 Siblings- all brothers a/w... Family History of Colon Cancer:MGM Family History of Breast Cancer:Mother   Social History: Reviewed history from 11/03/2009 and no changes required. Divorced 1 daughter, age 85 & 2 grandchildren... Never smoked No alcohol Retired from [employer], works for [employer]- 3rd shift. Daily Caffeine Use: 3 daily  Illicit Drug Use - no  Review of Systems      See HPI       The patient complains of dyspnea on exertion and difficulty walking.  The patient denies anorexia, fever, weight loss, weight gain, vision loss, decreased hearing, hoarseness, chest pain, syncope, peripheral edema, prolonged cough, headaches, hemoptysis, abdominal pain, melena, hematochezia, severe indigestion/heartburn, hematuria, incontinence, muscle weakness, suspicious skin lesions, transient blindness, depression, unusual  weight change, abnormal bleeding, enlarged lymph nodes, and angioedema.    Vital Signs:  Patient profile:   55 year old female Height:      61 inches Weight:      223 pounds BMI:     42.29 O2 Sat:      98 % on Room air Temp:     99.4 degrees F oral Pulse rate:   81 / minute BP sitting:   118 / 84  (right arm) Cuff size:   regular  Vitals Entered By: Randell Loop CMA (April 11, 2010 3:31 PM)  O2 Sat at Rest %:  98 O2 Flow:  Room air CC: 5 month ROV & review... Is Patient Diabetic? Yes Pain Assessment Patient in pain? no      Comments meds updated today with pt   Physical Exam  Additional Exam:  WD, Obese, 54 y/o BF in NAD... GENERAL:  Alert & oriented; pleasant & cooperative. HEENT:  [place]/AT, EOM-wnl, PERRLA, EACs-clear, TMs-wnl, NOSE-clear, THROAT-clear & wnl. NECK:  Supple w/ fairROM; no JVD; normal carotid impulses  w/o bruits; no thyromegaly or nodules palpated; no lymphadenopathy. CHEST:  Clear to P & A; without wheezes/ rales/ or rhonchi. HEART:  Regular Rhythm; without murmurs/ rubs/ or gallops. ABDOMEN:  Obese, soft & nontender; + panniculus; normal bowel sounds; no organomegaly or masses detected. EXT:  mild arthritic deformities bilat knees; no varicose veins/ +venous insuffic/ tr edema. NEURO:  CN's intact;  no focal neuro deficits... DERM:  No lesions noted; no rash etc...    MISC. Report  Procedure date:  04/11/2010  Findings:      DATA REVIEWED:  EMR note from DrPatterson and EGD, CT Chest> 1/11... Lab cumulative summary sheet data...  SN   Impression & Recommendations:  Problem # 1:  HYPERTENSION (ICD-401.9) BP controlled>  same meds. Her updated medication list for this problem includes:    Atacand 32 Mg Tabs (Candesartan cilexetil) .Marland Kitchen... Take 1 tablet by mouth once a day    Furosemide 40 Mg Tabs (Furosemide) .Marland Kitchen... 1 tab by mouth once daily  Problem # 2:  Hx of CHEST PAIN UNSPECIFIED (ICD-786.50) No known angina but CT Chest showed coronary cacif  seen...  Problem # 3:  HYPERLIPIDEMIA (ICD-272.4) Sl improved w/ diet Rx... now that she is losing wt it is hopefully even better...  Problem # 4:  DIABETES MELLITUS, BORDERLINE (ICD-790.29) Diet + exercise are the keys to success...  Problem # 5:  MORBID OBESITY (ICD-278.01) Down 17# on diet... keep up the good work...  Problem # 6:  OTHER DYSPHAGIA (ICD-787.29) See GI eval from DrPatterson...  Problem # 7:  OTHER MEDICAL PROBLEMS AS LISTED>>>   Complete Medication List: 1)  Adult Aspirin Low Strength 81 Mg Tbdp (Aspirin) .Marland Kitchen.. 1 tab daily 2)  Atacand 32 Mg Tabs (Candesartan cilexetil) .... Take 1 tablet by mouth once a day 3)  Furosemide 40 Mg Tabs (Furosemide) .Marland Kitchen.. 1 tab by mouth once daily 4)  Zantac 75 75 Mg Tabs (Ranitidine hcl) .... Take 1-2 tabs as needed for stomach acid.Marland KitchenMarland Kitchen 5)  Methocarbamol 500 Mg Tabs (Methocarbamol) .... Take one tablet by mouth four times daily as needed for muscle spasms 6)  Tramadol Hcl 50 Mg Tabs (Tramadol hcl) .... Take one tablet by mouth four times daily as needed for pain 7)  Alprazolam 0.5 Mg Tabs (Alprazolam) .... Take 1/2 to 1 tab by mouth up to three times a day as needed... 8)  Multivitamins Tabs (Multiple vitamin) .... Take 1 tablet by mouth once a day 9)  Calcium 600 Mg Tabs (Calcium) .... Take 1 tablet by mouth once a day  Patient Instructions: 1)  Today we updated your med list- see below.... 2)  We refilled your meds per request... 3)  Keep up the good work w/ diet + exercise... 4)  Call for any problems.Marland KitchenMarland Kitchen 5)  Please schedule a follow-up appointment in 4-6 months, with FASTING blood work... Prescriptions: ALPRAZOLAM 0.5 MG TABS (ALPRAZOLAM) take 1/2 to 1 tab by mouth up to three times a day as needed...  #100 x 4   Entered and Authorized by:   Michele Mcalpine MD   Signed by:   Michele Mcalpine MD on 04/11/2010   Method used:   Print then Give to Patient   RxID:   [id number] TRAMADOL HCL 50 MG TABS (TRAMADOL HCL) take one tablet by  mouth four times daily as needed for pain  #100 x 4   Entered and Authorized by:   Michele Mcalpine MD   Signed by:   Lonzo Cloud  Kriste Basque MD on 04/11/2010   Method used:   Print then Give to Patient   RxID:   [id number] METHOCARBAMOL 500 MG TABS (METHOCARBAMOL) take one tablet by mouth four times daily as needed for muscle spasms  #100 x 4   Entered and Authorized by:   Michele Mcalpine MD   Signed by:   Michele Mcalpine MD on 04/11/2010   Method used:   Print then Give to Patient   RxID:   [id number] ZANTAC 75 75 MG TABS (RANITIDINE HCL) take 1-2 tabs as needed for stomach acid...  #90 x 4   Entered and Authorized by:   Michele Mcalpine MD   Signed by:   Michele Mcalpine MD on 04/11/2010   Method used:   Print then Give to Patient   RxID:   [id number] FUROSEMIDE 40 MG  TABS (FUROSEMIDE) 1 tab by mouth once daily  #90 x 4   Entered and Authorized by:   Michele Mcalpine MD   Signed by:   Michele Mcalpine MD on 04/11/2010   Method used:   Print then Give to Patient   RxID:   [id number] ATACAND 32 MG TABS (CANDESARTAN CILEXETIL) Take 1 tablet by mouth once a day  #90 x 4   Entered and Authorized by:   Michele Mcalpine MD   Signed by:   Michele Mcalpine MD on 04/11/2010   Method used:   Print then Give to Patient   RxID:   [id number]

## 2010-11-21 NOTE — Miscellaneous (Signed)
Summary: Orders Update-CXR order  Clinical Lists Changes  Orders: Added new Test order of T-2 View CXR ([id number]) - Signed 

## 2010-11-21 NOTE — Progress Notes (Signed)
Summary: rx req  Phone Note Call from Patient   Caller: Patient Call For: nadel Summary of Call: says a fax was sent here (from [facility]) and it needs to be responded asap please. needs rx for 7 days for: atacant 32 mg/ and furosemide 40 mg. Patient's chart has been requested. Initial call taken by: Tivis Ringer,  December 18, 2007 9:09 AM  Follow-up for Phone Call        sent 7 day supply of meds to [facility] - pt aware Follow-up by: Marinus Maw,  December 18, 2007 9:25 AM      Prescriptions: ATACAND 32 MG TABS (CANDESARTAN CILEXETIL) Take 1 tablet by mouth once a day  #7 x 0   Entered by:   Marinus Maw   Authorized by:   Michele Mcalpine MD   Signed by:   Marinus Maw on 12/18/2007   Method used:   Electronically sent to ...       [id number]       [address] [place], [place]  [address]       Ph: [phone]       Fax: [phone]   RxID:   [id number] FUROSEMIDE 40 MG  TABS (FUROSEMIDE) 1 tab by mouth once daily  #7 x 0   Entered by:   Marinus Maw   Authorized by:   Michele Mcalpine MD   Signed by:   Marinus Maw on 12/18/2007   Method used:   Electronically sent to ...       [facility]  [id number]       [address] [place], [place]  [address]       Ph: [phone]       Fax: [phone]   RxID:   [id number]

## 2010-11-21 NOTE — Progress Notes (Signed)
Summary: scalp red and tender  Phone Note Call from Patient   Caller: Patient Call For: Jaycelyn Orrison Summary of Call: scalp red and tender [facility] [address] Initial call taken by: Rickard Patience,  June 22, 2010 3:35 PM  Follow-up for Phone Call        Called, spoke with pt.  Pt states she has an allergic reaction to lobster in July.  Since the reaction her scalp has been itching, red, and tender.  Was seen by Dermatologist for this -- given shampoo and oil -- but problem still exsists.  Pt also c/o rash on bilateral elbows x "couple of weeks."  Rash was itching and red.  Denies being warm to touch or scaley.  States it is almost gone but is leaving a scar.    Informed pt she needs to call her dermatologist about both of these problems and I would inform SN of this as well.  We would call her back if he has any further recs.  She is ok with this.  Will forward to SN.   Follow-up by: Gweneth Dimitri RN,  June 22, 2010 3:48 PM  Additional Follow-up for Phone Call Additional follow up Details #1::        per SN---if itching is an issue offer atarax 25mg   #50  1 by mouth every 4 hours as needed for itching  with 5 refills.  called and spoke with pt and she is aware of meds sent to the pharmacy per SN Randell Loop CMA  June 22, 2010 4:53 PM     New/Updated Medications: HYDROXYZINE HCL 25 MG TABS (HYDROXYZINE HCL) take one tablet by mouth every 4 hours as needed for itching Prescriptions: HYDROXYZINE HCL 25 MG TABS (HYDROXYZINE HCL) take one tablet by mouth every 4 hours as needed for itching  #50 x 5   Entered by:   Randell Loop CMA   Authorized by:   Michele Mcalpine MD   Signed by:   Randell Loop CMA on 06/22/2010   Method used:   Electronically to        [facility] (retail)       [address] [place], [place]  [address]       Ph: [phone]       Fax: [phone]   RxID:   [id number]

## 2010-11-21 NOTE — Letter (Signed)
Summary: Out of Work  [facility]  [address]   [place], [place] [address]   Phone: [phone]  Fax: [phone]    Mar 13, 2010   Employee:  NEAL OSHEA    To Whom It May Concern:   For Medical reasons, please excuse the above named employee from work for the following dates:  Start:   03-11-2010  End:   03-12-2010  If you need additional information, please feel free to contact our office.         Sincerely,       Lonzo Cloud. Elayne Snare. D.

## 2010-11-21 NOTE — Progress Notes (Signed)
Summary: sinus congestion  Phone Note Call from Patient Call back at [facility] Phone [phone]   Caller: Patient Call For: Dashia Caldeira Summary of Call: pt c/o sinus congestion/ cough/ clear mucus x 4 days. denies fever. requests z pac. [facility] [address]. note: pt has used OTC dayquil w/ no relief.  Initial call taken by: Tivis Ringer,  July 11, 2009 11:04 AM  Follow-up for Phone Call        Please Advise.  Pt allergic to PCN, Codeine and Simvastatin. Abigail Miyamoto RN  July 11, 2009 11:10 AM    per SN---ok for pt to have zpak #1  take as directed with no refills. thanks Marijo File CMA  July 11, 2009 2:09 PM   Additional Follow-up for Phone Call Additional follow up Details #1::        rx sent. pt notified. Carron Curie CMA  July 11, 2009 2:49 PM     New/Updated Medications: ZITHROMAX Z-PAK 250 MG TABS (AZITHROMYCIN) take 2 tablets today and then one tablet daily until gone. Prescriptions: ZITHROMAX Z-PAK 250 MG TABS (AZITHROMYCIN) take 2 tablets today and then one tablet daily until gone.  #1 pak x 0   Entered by:   Carron Curie CMA   Authorized by:   Michele Mcalpine MD   Signed by:   Carron Curie CMA on 07/11/2009   Method used:   Electronically to        [facility] (retail)       [address] [place], [place]  [address]       Ph: [phone]       Fax: [phone]   RxID:   [id number]

## 2010-11-21 NOTE — Assessment & Plan Note (Signed)
Summary: rov/resch from 03/17-ok per leigh/apc   CC:  Yearly ROV & review of mult med problems....  History of Present Illness: [name] here for a yearly follow up visit... she has mult medical problems including: HBP, Chol, CWP, HA's w/ pseudotumor cerebri, and anxiety... she states that she is doing well and wants her meds refilled for 2010... she states that she tried the Simvastatin in 2009, but was intol due to headaches and insomnia, therefore she returned to her diet efforts alone...    Current Problems:   HYPERTENSION (ICD-401.9) - controlled on ATACAND 32mg /d and FUROSEMIDE 40mg /d... BP today= 130/90 & taking meds regularly/ tolerating well... checks BP occas at home and usually runs 120's/ 80's... she is not really on a diet, not watching her salt intake, not counting calories, etc... denies HA, fatigue, visual changes, CP, palipit, dizziness, syncope, dyspnea, edema, etc... we discussed the importance of low sodium & weight reduction for her BP control.  Hx of CHEST PAIN UNSPECIFIED (ICD-786.50) - on ASA 81mg /d... prev hx CWP.Marland Kitchen.   ~  baseline EKG = NSR, WNL...  HYPERLIPIDEMIA (ICD-272.4) - on diet Rx alone... she tried Simv40 in 2009 but INTOL w/ HA & insomnia.  ~  FLP 4/07 showed TChol 191, TG 70, HDL 61, LDL 116...  ~  FLP 2/09 showed TChol 224, Tg 85, HDL 71, LDL 135...  ~  FLP 12/09 showed TChol 207, TG 73, HDL 58, LDL 138...  ~  FLP 4/10 showed =   DIABETES MELLITUS, BORDERLINE (ICD-790.29) - FBS 90-115 over the last few yrs... diet Rx.  ~  labs 2/09 showed BS= 110, HgA1c= 6.2.Marland Kitchen. rec> diet Rx, get wt down.  ~  labs 12/09 showed BS= 102, HgA1c= 6.2...   MORBID OBESITY (ICD-278.01) - we have discussed Bariatric Surg and she has several relatives who have had surg... "I'm afraid"- pt encouraged to seek counsel at [facility].  ~  weight 2/09 = 247#... 5'1" tall... BMI=47...   ~  weight 3/10 = 256#... we reviewed diet + exercise therapy...  IRRITABLE BOWEL  SYNDROME (ICD-564.1) - last colon 5/06 by DrPatterson... normal exam.  SPONDYLOSIS, LUMBAR (ICD-721.3) - she had MRI 1/08 by DrKramer showing bulging discs and some facet arthropathy...  Hx of HEADACHE (ICD-784.0) - eval in the [facility] in 2003... she uses Ibuprofen Prn...  Hx of PSEUDOTUMOR CEREBRI (ICD-348.2) - prev followed by Deland Pretty and DrMartin at [place], plus DrLove in [place]...  ~  Mar10: pt had eye check recently at [facility] and was told OK...  ANXIETY (ICD-300.00) - has used XANAX 0.5mg  as needed in past...  ALOPECIA (ICD-704.00)   Allergies: 1)  ! Codeine 2)  ! Penicillin 3)  ! Simvastatin  Comments:  Nurse/Medical Assistant: The patient's medications and allergies were reviewed with the patient and were updated in the Medication and Allergy Lists.  Past History:  Past Medical History:    HYPERTENSION (ICD-401.9)    Hx of CHEST PAIN UNSPECIFIED (ICD-786.50)    HYPERLIPIDEMIA (ICD-272.4)    DIABETES MELLITUS, BORDERLINE (ICD-790.29)    MORBID OBESITY (ICD-278.01)    IRRITABLE BOWEL SYNDROME (ICD-564.1)    SPONDYLOSIS, LUMBAR (ICD-721.3)    Hx of HEADACHE (ICD-784.0)    Hx of PSEUDOTUMOR CEREBRI (ICD-348.2)    ANXIETY (ICD-300.00)    ALOPECIA (ICD-704.00)  Past Surgical History:    CSection 1977 w/ wound complication  Family History:    Reviewed history and no changes required:  Social History:    Reviewed history  and no changes required:       Retired--2007 from the [employer]       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  The patient denies anorexia, fever, weight loss, weight gain, vision loss, decreased hearing, hoarseness, chest pain, syncope, peripheral edema, prolonged cough, headaches, hemoptysis, abdominal pain, melena, hematochezia, severe indigestion/heartburn, hematuria,  incontinence, muscle weakness, suspicious skin lesions, transient blindness, depression, unusual weight change, abnormal bleeding, enlarged lymph nodes, and angioedema.    Vital Signs:  Patient profile:   55 year old female Height:      61 inches Weight:      255.50 pounds BMI:     48.45 O2 Sat:      97 % Pulse rate:   89 / minute BP sitting:   130 / 90  (right arm) Cuff size:   regular  O2 Sat at Rest %:  97 O2 Flow:  room air  Physical Exam  Additional Exam:  WD, Obese, [name] in NAD... GENERAL:  Alert & oriented; pleasant & cooperative. HEENT:  [place]/AT, EOM-wnl, PERRLA, EACs-clear, TMs-wnl, NOSE-clear, THROAT-clear & wnl. NECK:  Supple w/ fairROM; no JVD; normal carotid impulses w/o bruits; no thyromegaly or nodules palpated; no lymphadenopathy. CHEST:  Clear to P & A; without wheezes/ rales/ or rhonchi. HEART:  Regular Rhythm; without murmurs/ rubs/ or gallops. ABDOMEN:  Obese, soft & nontender; + panniculus; normal bowel sounds; no organomegaly or masses detected. EXT:  mild arthritic deformities bilat knees; no varicose veins/ +venous insuffic/ tr edema. NEURO:  CN's intact;  no focal neuro deficits... DERM:  No lesions noted; no rash etc...     Impression & Recommendations:  Problem # 1:  HYPERTENSION (ICD-401.9) Controlled-  I told her that she could likely decr meds if only she could lose some weight... Her updated medication list for this problem includes:    Atacand 32 Mg Tabs (Candesartan cilexetil) .Marland Kitchen... Take 1 tablet by mouth once a day    Furosemide 40 Mg Tabs (Furosemide) .Marland Kitchen... 1 tab by mouth once daily  Problem # 2:  HYPERLIPIDEMIA (ICD-272.4) She was intol to the Simva40... she wants to re-check FLP on diet alone, and poss consider low dose Crestor if nec... The following medications were removed from the medication list:    Simvastatin 40 Mg Tabs (Simvastatin) .Marland Kitchen... Take 1 tab by mouth at bedtime  Problem # 3:  DIABETES MELLITUS, BORDERLINE  (ICD-790.29) We discussed diet + exercise and the need to lose weight...  Problem # 4:  MORBID OBESITY (ICD-278.01) We discussed Bariatric surgery approach... she has 2 relatives w/ successful surgery doing well!!!  Problem # 5:  Hx of PSEUDOTUMOR CEREBRI (ICD-348.2) She states that recent ophthal follow up was neg... doing well, asymptomatic...  Problem # 6:  ANXIETY (ICD-300.00) Stable-  refill med. Her updated medication list for this problem includes:    Alprazolam 0.5 Mg Tabs (Alprazolam) .Marland Kitchen... Take 1/2 to 1 whole  tablet by mouth three times a day as needed for anxiety  Complete Medication List: 1)  Adult Aspirin Low Strength 81 Mg Tbdp (Aspirin) .Marland Kitchen.. 1 tab daily 2)  Atacand 32 Mg Tabs (Candesartan cilexetil) .... Take 1 tablet by mouth once a day 3)  Furosemide 40 Mg Tabs (Furosemide) .Marland Kitchen.. 1 tab by mouth once daily 4)  Zantac 75 75 Mg Tabs (Ranitidine hcl) .... Take 1-2 tabs as needed for stomach acid.Marland KitchenMarland Kitchen 5)  Alprazolam 0.5 Mg Tabs (Alprazolam) .... Take 1/2 to 1 whole  tablet by mouth three times a day as needed for anxiety  Patient Instructions: 1)  Today we updated your med list- see below.... 2)  We refilled your meds for 2010... 3)  Please return to our lab on morning next week (April 5-9) for your FASTING blood work... please call the "phone tree" in a few days for your lab results.Marland KitchenMarland Kitchen 4)  Keiva, let's get on track w/ our diet and exercise program... strive to lose 15-20 lbs over the next 6 months... I still think you should consider the Bariatric Surgery program at [facility] here in [place]... 5)  Call for any problems.Marland KitchenMarland Kitchen 6)  Please schedule a follow-up appointment in 6 months. Prescriptions: ATACAND 32 MG TABS (CANDESARTAN CILEXETIL) Take 1 tablet by mouth once a day  #90 x 4   Entered by:   Marijo File CMA   Authorized by:   Michele Mcalpine MD   Signed by:   Marijo File CMA on 01/17/2009   Method used:   Print then Give to Patient   RxID:    [id number] FUROSEMIDE 40 MG  TABS (FUROSEMIDE) 1 tab by mouth once daily  #90 x 4   Entered by:   Marijo File CMA   Authorized by:   Michele Mcalpine MD   Signed by:   Marijo File CMA on 01/17/2009   Method used:   Print then Give to Patient   RxID:   [id number] ALPRAZOLAM 0.5 MG TABS (ALPRAZOLAM) Take 1/2 to 1 whole  tablet by mouth three times a day as needed for anxiety  #100 x 5   Entered and Authorized by:   Michele Mcalpine MD   Signed by:   Michele Mcalpine MD on 01/17/2009   Method used:   Print then Give to Patient   RxID:   [id number] FUROSEMIDE 40 MG  TABS (FUROSEMIDE) 1 tab by mouth once daily  #30 x prn   Entered and Authorized by:   Michele Mcalpine MD   Signed by:   Michele Mcalpine MD on 01/17/2009   Method used:   Print then Give to Patient   RxID:   [id number] ATACAND 32 MG TABS (CANDESARTAN CILEXETIL) Take 1 tablet by mouth once a day  #30 x prn   Entered and Authorized by:   Michele Mcalpine MD   Signed by:   Michele Mcalpine MD on 01/17/2009   Method used:   Print then Give to Patient   RxID:   [id number]  prescriptions were reprinted due to pt using mail order Marijo File CMA  January 17, 2009 4:23 PM

## 2010-11-21 NOTE — Progress Notes (Signed)
Summary: dc drug  Phone Note Call from Patient Call back at Home Phone [phone]   Caller: Patient Call For: tammy p/nadel Reason for Call: Talk to Nurse Summary of Call: pt says simvistatin 40mg  is causing headaches, body aches and insomnia.  Not going to take it anymore.  Pt thinks it may be too strong.  can u check? Initial call taken by: Eugene Gavia,  October 06, 2008 8:39 AM  Follow-up for Phone Call        Spoke with pt.  She started taking simvastatin 40 mg this wk and two days after started med she started having HA, leg and joint pain and trouble sleeping.  She no lo longer wants to take med.  Please advise, thanks! Follow-up by: Vernie Murders,  October 06, 2008 10:52 AM  Additional Follow-up for Phone Call Additional follow up Details #1::        per SN---ask her if she wants to take 1/2 tablet every day to see how this works out.  if not, then  d/c med and do the best she can with diet alone. LMOMTCB for pt. Marijo File CMA  October 06, 2008 2:11 PM     Additional Follow-up for Phone Call Additional follow up Details #2::    lmom with sn's response also left msg if having further problems or questions to give [place] a call back Follow-up by: Philipp Deputy CMA,  October 07, 2008 3:02 PM

## 2010-11-21 NOTE — Assessment & Plan Note (Signed)
Summary: DYSPHAGIA/YF   History of Present Illness Visit Type: consult  Primary GI MD: Sheryn Bison MD FACP FAGA Primary Provider: Alroy Dust, MD  Requesting Provider: Alroy Dust, MD  Chief Complaint: Loss of appetite and when pt swallows it feels like food is getting stuck  History of Present Illness:   This patient is a very pleasant 55 year old African American female with chronic hypertension hyperlipidemia referred through the courtesy of Dr. Kriste Basque for evaluation of a several week history of a globus sensation in her throat without true dysphagia.  I see Mrs. Youngman in the past for screening colonoscopy which was unremarkable several years ago. She denies lower gastrointestinal problems at this time. She also denies burning substernal chest pain, regurgitation, or any other reflux symptoms except for a new onset constant choking sensation in her retropharyngeal area. She is been placed on Dexilant 60 mg a day by her primary care physician but she does not take this medication with any regularity. She gives no history of known peptic ulcer disease or gallbladder disease, or any hepatobiliary complaints. Her appetite is good and her weight is stable. She denies any history of Raynaud's phenomenon. I cannot see on a review her records where she has had previous endoscopic exam or upper GI series. She denies a history of chronic thyroid dysfunction or thyromegaly.   GI Review of Systems    Reports loss of appetite.      Denies abdominal pain, acid reflux, belching, bloating, chest pain, dysphagia with liquids, dysphagia with solids, heartburn, nausea, vomiting, vomiting blood, weight loss, and  weight gain.        Denies anal fissure, black tarry stools, change in bowel habit, constipation, diarrhea, diverticulosis, fecal incontinence, heme positive stool, hemorrhoids, irritable bowel syndrome, jaundice, light color stool, liver problems, rectal bleeding, and  rectal pain.    Current  Medications (verified): 1)  Adult Aspirin Low Strength 81 Mg  Tbdp (Aspirin) .Marland Kitchen.. 1 Tab Daily 2)  Atacand 32 Mg Tabs (Candesartan Cilexetil) .... Take 1 Tablet By Mouth Once A Day 3)  Furosemide 40 Mg  Tabs (Furosemide) .Marland Kitchen.. 1 Tab By Mouth Once Daily 4)  Zantac 75 75 Mg Tabs (Ranitidine Hcl) .... Take 1-2 Tabs As Needed For Stomach Acid... 5)  Methocarbamol 500 Mg Tabs (Methocarbamol) .... Take One Tablet By Mouth Four Times Daily As Needed For Muscle Spasms 6)  Tramadol Hcl 50 Mg Tabs (Tramadol Hcl) .... Take One Tablet By Mouth Four Times Daily As Needed For Pain 7)  Alprazolam 0.5 Mg Tabs (Alprazolam) .... Take 1/2 To 1 Tab By Mouth Up To Three Times A Day As Needed... 8)  Dexilant 60 Mg Cpdr (Dexlansoprazole) .... Take 1 Cap By Mouth Once Daily- 30 Min Before The 1st Meal of The Day.  Allergies (verified): 1)  ! Codeine 2)  ! Penicillin 3)  ! Simvastatin  Past History:  Past medical, surgical, family and social histories (including risk factors) reviewed for relevance to current acute and chronic problems.  Past Medical History: HYPERTENSION (ICD-401.9) Hx of CHEST PAIN UNSPECIFIED (ICD-786.50) HYPERLIPIDEMIA (ICD-272.4) DIABETES MELLITUS, BORDERLINE (ICD-790.29) MORBID OBESITY (ICD-278.01) OTHER DYSPHAGIA (ICD-787.29) IRRITABLE BOWEL SYNDROME (ICD-564.1) SPONDYLOSIS, LUMBAR (ICD-721.3) Hx of HEADACHE (ICD-784.0) Hx of PSEUDOTUMOR CEREBRI (ICD-348.2) ANXIETY (ICD-300.00) ALOPECIA (ICD-704.00)  Past Surgical History: Reviewed history from 10/31/2009 and no changes required. CSection 1977 w/ wound complication  Family History: Reviewed history from 10/31/2009 and no changes required. Father alive age 38 w/ hx HBP, on dialysis, and DM  Mother, Merlene Laughter  Moore, age 65, hx HBP DM Arrhythmia Chol etc... 3 Siblings- all brothers a/w... Family History of Colon Cancer:MGM Family History of Breast Cancer:Mother   Social History: Reviewed history from 10/31/2009 and no changes  required. Divorced 1 daughter, age 70 & 2 grandchildren... Never smoked No alcohol Retired from [employer], works for [employer]- 3rd shift. Daily Caffeine Use: 3 daily  Illicit Drug Use - no Drug Use:  no  Review of Systems       The patient complains of back pain and menstrual pain.  The patient denies allergy/sinus, anemia, anxiety-new, arthritis/joint pain, blood in urine, breast changes/lumps, change in vision, confusion, cough, coughing up blood, depression-new, fainting, fatigue, fever, headaches-new, hearing problems, heart murmur, heart rhythm changes, itching, muscle pains/cramps, night sweats, nosebleeds, pregnancy symptoms, shortness of breath, skin rash, sleeping problems, sore throat, swelling of feet/legs, swollen lymph glands, thirst - excessive , urination - excessive , urination changes/pain, urine leakage, vision changes, and voice change.    Vital Signs:  Patient profile:   55 year old female Height:      61 inches Weight:      232 pounds BMI:     43.99 BSA:     2.01 Pulse rate:   72 / minute Pulse rhythm:   regular BP sitting:   120 / 80  (left arm) Cuff size:   regular  Vitals Entered By: Ok Anis CMA (November 03, 2009 9:17 AM)  Physical Exam  General:  Well developed, well nourished, no acute distress.healthy appearing.   Head:  Normocephalic and atraumatic. Eyes:  PERRLA, no icterus.exam deferred to patient's ophthalmologist.   Neck:  Supple; no masses or thyromegaly. Lungs:  Clear throughout to auscultation. Heart:  Regular rate and rhythm; no murmurs, rubs,  or bruits. Abdomen:  Soft, nontender and nondistended. No masses, hepatosplenomegaly or hernias noted. Normal bowel sounds.obese.   Extremities:  No clubbing, cyanosis, edema or deformities noted. Neurologic:  Alert and  oriented x4;  grossly normal neurologically. Cervical Nodes:  No significant cervical adenopathy. Inguinal Nodes:  No significant inguinal adenopathy. Psych:  Alert and  cooperative. Normal mood and affect.   Impression & Recommendations:  Problem # 1:  GLOBUS HYSTERICUS (ICD-300.11) Assessment New  This is most likely an extra esophageal manifestation of acid reflux. She has no classic reflux symptoms but this is not unusual in this condition. There is no history of severe anxiety disorder or recent psychological trauma. Because of her reluctance to take medication, I have scheduled her for endoscopic exam at her convenience. I have reviewed a reflux regime with her and urged her to take her PPI therapy daily 30 minutes before the first meal of the day.  Orders: EGD (EGD)  Problem # 2:  ADENOCARCINOMA, COLON, FAMILY HX (ICD-V16.0) Assessment: Unchanged Colonoscopy followup as per clinical protocol.  Problem # 3:  ASTHMA (ICD-493.90) Assessment: Unchanged Possibly related to chronic low-grade acid reflux.  Problem # 4:  HYPERTENSION (ICD-401.9) Assessment: Improved Blood pressure today is 120/80 and Norvasc continue all her other medications as per primary care.  Patient Instructions: 1)  Copy sent to :  Dr. Arline Asp tNadel 2)  Please continue current medications.  3)  Avoid foods high in acid content ( tomatoes, citrus juices, spicy foods) . Avoid eating within 3 to 4 hours of lying down or before exercising. Do not over eat; try smaller more frequent meals. Elevate head of bed four inches when sleeping.  4)  Conscious Sedation brochure given.  5)  Upper  Endoscopy brochure given.  6)  EGD scheduled for 11/09/09. 7)  The medication list was reviewed and reconciled.  All changed / newly prescribed medications were explained.  A complete medication list was provided to the patient / caregiver.

## 2010-11-21 NOTE — Progress Notes (Signed)
Summary: Needs CT scan  Phone Note Outgoing Call   Summary of Call: Pt needs CT scan of chest scheduled.  Per Dr. Jarold Motto. Initial call taken by: Ashok Cordia RN,  November 09, 2009 10:48 AM  Follow-up for Phone Call        Ct of chest scheduled for 11/10/09 at 11:00.  Erie Noe in recovery notified.  Pt needs to be NPO 2 hrs prior to scan.  Order faxed and sent to be pre certed. Follow-up by: Ashok Cordia RN,  November 09, 2009 11:19 AM

## 2010-11-21 NOTE — Procedures (Signed)
Summary: Colon   Colonoscopy  Procedure date:  03/14/2005  Findings:      Location:  [facility].    Colonoscopy  Procedure date:  03/14/2005  Findings:      Location:  [facility].   Patient Name: Susan [name], Susan [name] MRN:  Procedure Procedures: Colonoscopy CPT: 16109.  Personnel: Endoscopist: Vania Rea. Jarold Motto, MD.  Exam Location: Exam performed in Outpatient Clinic. Outpatient  Patient Consent: Procedure, Alternatives, Risks and Benefits discussed, consent obtained, from patient. Consent was obtained by the RN.  Indications Symptoms: Constipation Patient's stools are infrequent. Abdominal pain / bloating.  History  Current Medications: Patient is not currently taking Coumadin.  Pre-Exam Physical: Performed Mar 14, 2005. Cardio-pulmonary exam, Rectal exam, Abdominal exam, Extremity exam, Mental status exam WNL.  Exam Exam: Extent of exam reached: Cecum, extent intended: Cecum.  The cecum was identified by appendiceal orifice and IC valve. Patient position: on left side. Duration of exam: 20 minutes. Colon retroflexion performed. Images taken. ASA Classification: II. Tolerance: excellent.  Monitoring: Pulse and BP monitoring, Oximetry used. Supplemental O2 given. at 2 Liters.  Colon Prep Used Golytely for colon prep. Prep results: excellent.  Sedation Meds: Patient assessed and found to be appropriate for moderate (conscious) sedation. Fentanyl 75 mcg. given IV. Versed 7 mg. given IV.  Instrument(s): CF 140L. Serial [id number].  Findings - NORMAL EXAM: Cecum to Rectum. Not Seen: Polyps. AVM's. Colitis. Tumors. Melanosis. Crohn's. Diverticulosis. Hemorrhoids.   Assessment Normal examination.  Events  Unplanned Interventions: No intervention was required.  Plans Medication Plan: Continue current medications.  Patient Education: Patient given standard instructions for: a normal exam.  Disposition: After procedure  patient sent to recovery. After recovery patient sent home.  Scheduling/Referral: Follow-Up prn.    This report was created from the original endoscopy report, which was reviewed and signed by the above listed endoscopist.

## 2010-11-21 NOTE — Progress Notes (Signed)
Summary: note for work  Phone Note Call from Patient Call back at [employer] [phone]   Caller: Patient Call For: nadel Reason for Call: Talk to Nurse Summary of Call: pt missed work on 03/11/2010-03/12/2010 due to headache and back pain.  Can she get a note from SN for this? Initial call taken by: Eugene Gavia,  Mar 13, 2010 8:21 AM  Follow-up for Phone Call        Pt states she missed work due to headache that she had all day on Saturday. Pt c/o having h/a after starting a new medication called Hot Flashes from [facility]. Pt stated once she stopped taking same the h/a went away. Pt also c/o lower back pain. Pt work schedule is 10:30pm-7am and is requesting an out of work note dated 05/21-05/22. Pt returned to work 03/12/10 pm. Zackery Barefoot CMA  Mar 13, 2010 9:40 AM   Additional Follow-up for Phone Call Additional follow up Details #1::        per SN---ok for note for work.  this has been printed out and is ready for pt to pick up.  pt is aware and note has been left up front for pt Randell Loop CMA  Mar 13, 2010 4:02 PM

## 2010-11-21 NOTE — Medication Information (Signed)
Summary: Refill for Histinex/[facility]  Refill for [facility]   Imported By: Maryln Gottron 02/06/2008 14:18:05  _____________________________________________________________________  External Attachment:    Type:   Image     Comment:   External Document

## 2010-11-21 NOTE — Letter (Signed)
Summary: Out of Work  [employer]  [address]   [place], [place] [address]   Phone: [phone]  Fax: [phone]    November 17, 2009   Employee:  DARLA MCDONALD    To Whom It May Concern:   For Medical reasons, please excuse the above named employee from work for the following dates:  Start:   Thursday November 17, 2009  End:   tuesday November 22, 2009  If you need additional information, please feel free to contact our office.         Sincerely,       Khaliah Barnick, N.P.

## 2010-11-21 NOTE — Assessment & Plan Note (Signed)
Summary: rov/apc   CC:  9 month ROV & review of mult medical problems....  History of Present Illness: [name] here for a yearly follow up visit... she has mult medical problems including: HBP, Chol, CWP, HA's w/ pseudotumor cerebri, and anxiety...    ~  Mar10:  she states that she is doing well and wants her meds refilled for 2010... she states that she tried the Simvastatin in 2009, but was intol due to headaches and insomnia, therefore she returned to her diet efforts alone...   ~  October 31, 2009:  she reports BP controlled on meds;  intol to Pravastatin w/ headaches therefore stopped this med on her own & doesn't want to try other statins or lipid clinic-  she will continue diet alone & work on weight reduction;  notes some back discomfort- improved on Robaxin + Tramadol;  finally notes some dysphagia in upper esoph area despite Zantac Rx, Xanax seems to help, but needs GI eval-  try Dexilant & refer to drPatterson...    Current Problems:   HYPERTENSION (ICD-401.9) - controlled on ATACAND 32mg /d and FUROSEMIDE 40mg /d... BP today= 122/78 & taking meds regularly/ tolerating well... checks BP occas at home and usually runs 120's/ 80's... she is not really on a diet, not watching her salt intake, not counting calories, etc... denies HA, fatigue, visual changes, CP, palipit, dizziness, syncope, dyspnea, edema, etc... we discussed the importance of low sodium & weight reduction for her BP control.  Hx of CHEST PAIN UNSPECIFIED (ICD-786.50) - on ASA 81mg /d... prev hx CWP.Marland Kitchen.   ~  baseline EKG = NSR, WNL...  HYPERLIPIDEMIA (ICD-272.4) - on diet Rx alone... she tried Simv40 in 2009 & Pravastatin in 2010 but INTOL w/ HA & insomnia... refuses other statin meds or Lipid Clinic help...  ~  FLP 4/07 showed TChol 191, TG 70, HDL 61, LDL 116  ~  FLP 2/09 showed TChol 224, Tg 85, HDL 71, LDL 135  ~  FLP 12/09 showed TChol 207, TG 73, HDL 58, LDL 138  ~  FLP 4/10 showed Tchol 222, TG 92, HDL 70, LDL  135  DIABETES MELLITUS, BORDERLINE (ICD-790.29) - FBS 90-115 over the last few yrs... diet Rx.  ~  labs 2/09 showed BS= 110, HgA1c= 6.2.Marland Kitchen. rec> diet Rx, get wt down.  ~  labs 12/09 showed BS= 102, HgA1c= 6.2  ~  labs 4/10 showed BS= 110, A1c= 6.4  MORBID OBESITY (ICD-278.01) - we have discussed Bariatric Surg and she has several relatives who have had surg... "I'm afraid"- pt encouraged to seek counsel at [facility].  ~  weight 2/09 = 247#... 5'1" tall... BMI=47...   ~  weight 3/10 = 256#... we reviewed diet + exercise therapy...  ~  weight 1/11 = 240#  OTHER DYSPHAGIA (ICD-787.29) - c/o upper esoph dysphagia w/ discomfort... no repsonse to Zantac, but sl better w/ Alpraz... refer to GI for further eval & Rx w/ Dexilant for now...  IRRITABLE BOWEL SYNDROME (ICD-564.1) - last colon 5/06 by DrPatterson... normal exam.  SPONDYLOSIS, LUMBAR (ICD-721.3) - she had MRI 1/08 by DrKramer showing bulging discs and some facet arthropathy... takes ROBAXIN 500mg  Qid Prn, and TRAMADOL 50mg  Qid Prn...  Hx of HEADACHE (ICD-784.0) - eval in the [facility] in 2003... she uses Ibuprofen Prn...  Hx of PSEUDOTUMOR CEREBRI (ICD-348.2) - prev followed by Deland Pretty and DrMartin at [place], plus DrLove in [place]...  ~  Mar10: pt had eye check recently at [facility] and was  told OK...  ANXIETY (ICD-300.00) - has used XANAX 0.5mg  as needed in past...    Allergies: 1)  ! Codeine 2)  ! Penicillin 3)  ! Simvastatin  Comments:  Nurse/Medical Assistant: The patient's medications and allergies were reviewed with the patient and were updated in the Medication and Allergy Lists.  Past History:  Past Medical History:  HYPERTENSION (ICD-401.9) Hx of CHEST PAIN UNSPECIFIED (ICD-786.50) HYPERLIPIDEMIA (ICD-272.4) DIABETES MELLITUS, BORDERLINE (ICD-790.29) MORBID OBESITY (ICD-278.01) OTHER DYSPHAGIA (ICD-787.29) IRRITABLE BOWEL SYNDROME (ICD-564.1) SPONDYLOSIS, LUMBAR (ICD-721.3) Hx of HEADACHE  (ICD-784.0) Hx of PSEUDOTUMOR CEREBRI (ICD-348.2) ANXIETY (ICD-300.00) ALOPECIA (ICD-704.00)  Past Surgical History: CSection 1977 w/ wound complication  Family History: Father alive age 23 w/ hx HBP, on dialysis Mother, Migdalia Dk, age 45, hx HBP DM Arrhythmia Chol etc... 3 Siblings- all brothers a/w...  Social History: Divorced 1 daughter, age 67 & 2 grandchildren... Never smoked No alcohol Retired from [employer], works for [employer]- 3rd shift.  Review of Systems       The patient complains of dyspnea on exertion, indigestion/heartburn, dysphagia, back pain, joint pain, stiffness, and difficulty walking.  The patient denies fever, chills, sweats, anorexia, fatigue, weakness, malaise, weight loss, sleep disorder, blurring, diplopia, eye irritation, eye discharge, vision loss, eye pain, photophobia, earache, ear discharge, tinnitus, decreased hearing, nasal congestion, nosebleeds, sore throat, hoarseness, chest pain, palpitations, syncope, orthopnea, PND, peripheral edema, cough, dyspnea at rest, excessive sputum, hemoptysis, wheezing, pleurisy, nausea, vomiting, diarrhea, constipation, change in bowel habits, abdominal pain, melena, hematochezia, jaundice, gas/bloating, odynophagia, dysuria, hematuria, urinary frequency, urinary hesitancy, nocturia, incontinence, joint swelling, muscle cramps, muscle weakness, arthritis, sciatica, restless legs, leg pain at night, leg pain with exertion, rash, itching, dryness, suspicious lesions, paralysis, paresthesias, seizures, tremors, vertigo, transient blindness, frequent falls, frequent headaches, depression, anxiety, memory loss, confusion, cold intolerance, heat intolerance, polydipsia, polyphagia, polyuria, unusual weight change, abnormal bruising, bleeding, enlarged lymph nodes, urticaria, allergic rash, hay fever, and recurrent infections.    Vital Signs:  Patient profile:   55 year old female Height:      61 inches Weight:      240.25  pounds BMI:     45.56 O2 Sat:      99 % on Room air Temp:     98.5 degrees F oral Pulse rate:   78 / minute BP sitting:   122 / 78  (left arm) Cuff size:   large  Vitals Entered By: Randell Loop CMA (October 31, 2009 10:21 AM)  O2 Sat at Rest %:  99 O2 Flow:  Room air CC: 9 month ROV & review of mult medical problems... Is Patient Diabetic? Yes Comments meds updated today   Physical Exam  Additional Exam:  WD, Obese, [name] in NAD... GENERAL:  Alert & oriented; pleasant & cooperative. HEENT:  [place]/AT, EOM-wnl, PERRLA, EACs-clear, TMs-wnl, NOSE-clear, THROAT-clear & wnl. NECK:  Supple w/ fairROM; no JVD; normal carotid impulses w/o bruits; no thyromegaly or nodules palpated; no lymphadenopathy. CHEST:  Clear to P & A; without wheezes/ rales/ or rhonchi. HEART:  Regular Rhythm; without murmurs/ rubs/ or gallops. ABDOMEN:  Obese, soft & nontender; + panniculus; normal bowel sounds; no organomegaly or masses detected. EXT:  mild arthritic deformities bilat knees; no varicose veins/ +venous insuffic/ tr edema. NEURO:  CN's intact;  no focal neuro deficits... DERM:  No lesions noted; no rash etc...     MISC. Report  Procedure date:  10/31/2009  Findings:      Pt to return to our lab  one morning this week for FASTING blood work... we will review & contact her w/ results...  SN   Impression & Recommendations:  Problem # 1:  HYPERTENSION (ICD-401.9) Controlled-  same meds. Her updated medication list for this problem includes:    Atacand 32 Mg Tabs (Candesartan cilexetil) .Marland Kitchen... Take 1 tablet by mouth once a day    Furosemide 40 Mg Tabs (Furosemide) .Marland Kitchen... 1 tab by mouth once daily  Problem # 2:  HYPERLIPIDEMIA (ICD-272.4) On diet alone-  states intol to statins w/ HA's... refuses meds or Lipid Clinic... she will continue low chol/ low fat diet & exercise program... The following medications were removed from the medication list:    Pravastatin Sodium 40 Mg Tabs (Pravastatin  sodium) .Marland Kitchen... Take 1 tablet by mouth once a day  Problem # 3:  DIABETES MELLITUS, BORDERLINE (ICD-790.29) We discussed diet + exercise program...  Problem # 4:  MORBID OBESITY (ICD-278.01) Weight reduction is the key...  Problem # 5:  OTHER DYSPHAGIA (ICD-787.29) She notes upper esoph dysphagia.... Xanax seems to help a little... disuceed cricopharyngeus dysfuction & rec continuing the Alpraz Bid regularly... she would like further eval & we discussed f/u appt w/ DrPatterson... in the meanwhile try Dexilant daily... Orders: Gastroenterology Referral (GI)  Problem # 6:  SPONDYLOSIS, LUMBAR (ICD-721.3) Controlled on OTC meds Prn...  Problem # 7:  Hx of PSEUDOTUMOR CEREBRI (ICD-348.2) No recent problems, or headaches as long as she doesn't take Statin meds...  Problem # 8:  OTHER MEDICAL PROBLEMS AS NOTED>>>  Complete Medication List: 1)  Adult Aspirin Low Strength 81 Mg Tbdp (Aspirin) .Marland Kitchen.. 1 tab daily 2)  Atacand 32 Mg Tabs (Candesartan cilexetil) .... Take 1 tablet by mouth once a day 3)  Furosemide 40 Mg Tabs (Furosemide) .Marland Kitchen.. 1 tab by mouth once daily 4)  Zantac 75 75 Mg Tabs (Ranitidine hcl) .... Take 1-2 tabs as needed for stomach acid.Marland KitchenMarland Kitchen 5)  Methocarbamol 500 Mg Tabs (Methocarbamol) .... Take one tablet by mouth four times daily as needed for muscle spasms 6)  Tramadol Hcl 50 Mg Tabs (Tramadol hcl) .... Take one tablet by mouth four times daily as needed for pain 7)  Alprazolam 0.5 Mg Tabs (Alprazolam) .... Take 1/2 to 1 tab by mouth up to three times a day as needed... 8)  Dexilant 60 Mg Cpdr (Dexlansoprazole) .... Take 1 cap by mouth once daily- 30 min before the 1st meal of the day.  Other Orders: Prescription Created Electronically [id number] [id number])  Patient Instructions: 1)  Today we updated your med list- see below.... 2)  Today we refilled your meds per request... 3)  We also wrote for a new med- Dexilant to take once daily- 30 min before the first meal of the day for stomach  acid.Marland KitchenMarland Kitchen 4)  We will arrange for a GI eval from DrPatterson regarding your swallowing difficulty.Marland KitchenMarland Kitchen 5)  Please return to our lab one morning this week for your FASTING blood work... please call the "phone tree" in a few days for your lab results.Marland KitchenMarland Kitchen 6)  Keep up the good work w/ weight reduction.Marland KitchenMarland Kitchen 7)  Call for any questions...  8)  Please schedule a follow-up appointment in 6 months. Prescriptions: DEXILANT 60 MG CPDR (DEXLANSOPRAZOLE) take 1 cap by mouth once daily- 30 min before the 1st meal of the day.  #30 x prn   Entered and Authorized by:   Michele Mcalpine MD   Signed by:   Michele Mcalpine MD on 10/31/2009   Method used:  Print then Give to Patient   RxID:   [id number] TRAMADOL HCL 50 MG TABS (TRAMADOL HCL) take one tablet by mouth four times daily as needed for pain  #100 x 5   Entered and Authorized by:   Michele Mcalpine MD   Signed by:   Michele Mcalpine MD on 10/31/2009   Method used:   Print then Give to Patient   RxID:   [id number] METHOCARBAMOL 500 MG TABS (METHOCARBAMOL) take one tablet by mouth four times daily as needed for muscle spasms  #100 x 5   Entered and Authorized by:   Michele Mcalpine MD   Signed by:   Michele Mcalpine MD on 10/31/2009   Method used:   Print then Give to Patient   RxID:   [id number] ALPRAZOLAM 0.5 MG TABS (ALPRAZOLAM) take 1/2 to 1 tab by mouth up to three times a day as needed...  #100 x 5   Entered and Authorized by:   Michele Mcalpine MD   Signed by:   Michele Mcalpine MD on 10/31/2009   Method used:   Print then Give to Patient   RxID:   [id number] FUROSEMIDE 40 MG  TABS (FUROSEMIDE) 1 tab by mouth once daily  #90 x 3   Entered and Authorized by:   Michele Mcalpine MD   Signed by:   Michele Mcalpine MD on 10/31/2009   Method used:   Print then Give to Patient   RxID:   [id number] ATACAND 32 MG TABS (CANDESARTAN CILEXETIL) Take 1 tablet by mouth once a day  #90 x 3   Entered and Authorized by:   Michele Mcalpine MD   Signed by:   Michele Mcalpine MD on 10/31/2009   Method used:   Print then Give to Patient   RxID:   [id number]

## 2010-11-21 NOTE — Assessment & Plan Note (Signed)
Summary: pain in her arm/ mbw   Chief Complaint:  achiness in right arm from wrist to shoulder x1 month  and wakes pt up from sleep.  History of Present Illness: 55 year old female with known history of HTN, Hyperlipidemia, DM presents for 1 month of right arm/shoulder pain. Complains of dull aches/tenderness along upper shoulder/neck with soreness down to wrist. No known injury. Taking muscle relaxer and tramadol without much help. Denies weakness, chest pain, parestesias, n/v/d, neuro changes.      Prior Medication List:  ADULT ASPIRIN LOW STRENGTH 81 MG  TBDP (ASPIRIN) 1 tab daily ATACAND 32 MG TABS (CANDESARTAN CILEXETIL) Take 1 tablet by mouth once a day FUROSEMIDE 40 MG  TABS (FUROSEMIDE) 1 tab by mouth once daily PRILOSEC OTC 20 MG  TBEC (OMEPRAZOLE MAGNESIUM) 1 tab by mouth once daily CLARITIN 10 MG  TABS (LORATADINE) 1 tab daily as needed for allergies...   Current Allergies (reviewed today): ! CODEINE ! PENICILLIN  Past Medical History:    Reviewed history from 11/26/2007 and no changes required:       HYPERTENSION (ICD-401.9)       Hx of CHEST PAIN UNSPECIFIED (ICD-786.50)       HYPERLIPIDEMIA (ICD-272.4)       DIABETES MELLITUS, BORDERLINE (ICD-790.29)       MORBID OBESITY (ICD-278.01)       IRRITABLE BOWEL SYNDROME (ICD-564.1)       Hx of HEADACHE (ICD-784.0)       Hx of PSEUDOTUMOR CEREBRI (ICD-348.2)       ANXIETY (ICD-300.00)       ALOPECIA (ICD-704.00)          Family History:    Reviewed history and no changes required:  Social History:    Reviewed history and no changes required:   Risk Factors:  Tobacco use:  never   Review of Systems      See HPI   Vital Signs:  Patient Profile:   55 Years Old Female Weight:      249.31 pounds O2 Sat:      97 % O2 treatment:    Room Air Temp:     97.4 degrees F oral Pulse rate:   76 / minute BP sitting:   122 / 76  (left arm) Cuff size:   large  Vitals Entered By: Boone Master CNA (January 23, 2008  10:52 AM)             Is Patient Diabetic? No Comments Medications reviewed with patient ..................................................................Marland KitchenBoone Master CNA  January 23, 2008 10:52 AM      Physical Exam  GENERAL:  A/Ox3; pleasant & cooperative.NAD HEENT:  [place]/AT, EOM-wnl, PERRLA, EACs-clear, TMs-wnl, NOSE-clear, THROAT-clear & wnl. NECK:  Supple w/ fair ROM; no JVD; normal carotid impulses w/o bruits; no thyromegaly or nodules palpated; no lymphadenopathy. CHEST:  Clear to P & A; w/o, wheezes/ rales/ or rhonchi. HEART:  RRR, no m/r/g  heard ABDOMEN:  Soft & nt; nml bowel sounds; no organomegaly or masses detected. EXT: Warm bilat,  no calf pain, edema, clubbing, pulses intact Equal strength bilaterally, normal hand grips, rom normal with reproducible sx on right, tenderness along upper arm/shoulder and lateral neck.  Skin: no rash/lesion    X-ray  Procedure date:  01/23/2008  Findings:      DG SHOULDER *R* - [id number]   Clinical Data: Right shoulder pain   RIGHT SHOULDER - 2+ VIEW   Comparison: None.   Findings: No evidence for fracture.  No subluxation or dislocation. No radiographic evidence for shoulder separation.  Overlying soft tissues are unremarkable.   IMPRESSION: No acute bony findings.    Impression & Recommendations:  Problem # 1:  SHOULDER PAIN, RIGHT (ICD-719.41) Suspect shoulder strain. XRay pending.  Ibuprofen 600mg  three times a day with food as needed pain Alternate ice/heat  Vicodin for severe pain as needed  follow up 2 weeks as needed, If not better may need ortho referral.  Please contact office for sooner follow up if symptoms do not improve or worsen  Orders: T-Shoulder Right (73030TC) Est. Patient Level IV (64403)   Medications Added to Medication List This Visit: 1)  Prilosec Otc 20 Mg Tbec (Omeprazole magnesium) .Marland Kitchen.. 1 tab by mouth once daily as needed 2)  Ibuprofen 600 Mg Tabs (Ibuprofen) .Marland Kitchen.. 1 by mouth three  times a day with food 3)  Vicodin 5-500 Mg Tabs (Hydrocodone-acetaminophen) .Marland Kitchen.. 1 by mouth every 6 hrs as needed   Patient Instructions: 1)  Ibuprofen 600mg  three times a day with food as needed pain 2)  Alternate ice/heat  3)  Vicodin for severe pain as needed  4)  follow up 2 weeks as needed  5)  Please contact office for sooner follow up if symptoms do not improve or worsen     Prescriptions: VICODIN 5-500 MG  TABS (HYDROCODONE-ACETAMINOPHEN) 1 by mouth every 6 hrs as needed  #20 x 0   Entered and Authorized by:   Rubye Oaks NP   Signed by:   Shaunte Weissinger NP on 01/23/2008   Method used:   Print then Give to Patient   RxID:   [id number] IBUPROFEN 600 MG  TABS (IBUPROFEN) 1 by mouth three times a day with food  #21 x 0   Entered and Authorized by:   Rubye Oaks NP   Signed by:   Natsumi Whitsitt NP on 01/23/2008   Method used:   Electronically sent to ...       [id number]       [address] [place], [place]  [address]       Ph: [phone]       Fax: [phone]   RxID:   [id number]  ]

## 2010-11-21 NOTE — Assessment & Plan Note (Signed)
Summary: FU///KP   Chief Complaint:  Yearly ROV....  History of Present Illness: [name] here for a yearly follow up visit... she has mult medical problems including: HBP, Chol, CWP, HA's w/ pseudotumor cerebri, and anxiety... she states that she is doing well and wants her meds refilled for 2009...   Current Problems:   HYPERTENSION (ICD-401.9) - controlled on ATACAND and FUROSEMIDE... checks BP occas at home and usually runs 130's/ 90's... she is not really on a diet, not watching her salt intake, not counting calories, etc... denies HA, fatigue, visual changes, CP, palipit, dizziness, syncope, dyspnea, edema, etc... BP here = 138/90...  Hx of CHEST PAIN UNSPECIFIED (ICD-786.50) - prev hx CWP... baseline EKG = NSR, WNL... HYPERLIPIDEMIA (ICD-272.4) - FLP 4/07 showed TChol 191, TG 70, HDL 61, LDL 116... diet Rx. DIABETES MELLITUS, BORDERLINE (ICD-790.29) - FBS 90-115 over the last few yrs... diet Rx. MORBID OBESITY (ICD-278.01) - 247#... 5'1" tall... BMI=47... we have discussed Bariatric Surg and she has several relatives who have had surg... "I'm afraid"- pt encouraged to seek [facility] at [facility]. IRRITABLE BOWEL SYNDROME (ICD-564.1) - last colon 5/06 by DrPatterson... normal exam. Hx of HEADACHE (ICD-784.0) - eval in the [facility] in 2003... Hx of PSEUDOTUMOR CEREBRI (ICD-348.2) - followed by Deland Pretty and DrMartin at [place], plus DrLove in [place]... ANXIETY (ICD-300.00) - has used XANAX as needed in past... ALOPECIA (ICD-704.00)     Current Allergies (reviewed today): ! CODEINE ! PENICILLIN Updated/Current Medications (including changes made in today's visit):  ADULT ASPIRIN LOW STRENGTH 81 MG  TBDP (ASPIRIN) 1 tab daily ATACAND 32 MG TABS (CANDESARTAN CILEXETIL) Take 1 tablet by mouth once a day FUROSEMIDE 40 MG  TABS (FUROSEMIDE) 1 tab by mouth once daily PRILOSEC OTC 20 MG  TBEC (OMEPRAZOLE MAGNESIUM) 1 tab by mouth once daily CLARITIN 10 MG  TABS  (LORATADINE) 1 tab daily as needed for allergies...   Past Medical History:            HYPERTENSION (ICD-401.9)    Hx of CHEST PAIN UNSPECIFIED (ICD-786.50)    HYPERLIPIDEMIA (ICD-272.4)    DIABETES MELLITUS, BORDERLINE (ICD-790.29)    MORBID OBESITY (ICD-278.01)    IRRITABLE BOWEL SYNDROME (ICD-564.1)    Hx of HEADACHE (ICD-784.0)    Hx of PSEUDOTUMOR CEREBRI (ICD-348.2)    ANXIETY (ICD-300.00)    ALOPECIA (ICD-704.00)      Past Surgical History:    CSection 1977 w/ wound complication     Review of Systems  The patient denies anorexia, fever, weight loss, vision loss, decreased hearing, hoarseness, chest pain, syncope, peripheral edema, prolonged cough, hemoptysis, abdominal pain, melena, hematochezia, severe indigestion/heartburn, hematuria, incontinence, muscle weakness, suspicious skin lesions, transient blindness, difficulty walking, depression, unusual weight change, abnormal bleeding, enlarged lymph nodes, and angioedema.         denies HA, fatigue, visual changes, CP, palipit, dizziness, syncope, dyspnea, edema, etc... denies cough, sputum, hemoptysis, worsening dyspnea,  wheezing, chest pains, snoring, daytime hypersomnolence, etc... denies nausea, vomiting, heartburn, diarrhea, constipation, blood in stool, abdominal pain, swelling, gas... denies focal weakness,  numbness,  paresthesia,  pain,  slurred speech,  headache,  dizziness,   memory loss,  tremor,  or seizure.    Vital Signs:  Patient Profile:   55 Years Old Female Weight:      246.6 pounds O2 Sat:      99 % O2 treatment:    Room Air Temp:     98.7 degrees F oral Pulse rate:  80 / minute BP sitting:   138 / 90  (left arm)  Vitals Entered By: Marijo File CMA (November 26, 2007 3:20 PM)                 Physical Exam  WD, Morbidly Obese, [name] in NAD... GENERAL:  Alert & oriented; pleasant & cooperative. HEENT:  [place]/AT, EOM-wnl, PERRLA, Fundi- not seen, EACs-clear, TMs-wnl, NOSE-clear,  THROAT-clear & wnl. NECK:  Supple w/ full ROM; no JVD; normal carotid impulses w/o bruits; no thyromegaly or nodules palpated; no lymphadenopathy. CHEST:  Clear to P & A; without wheezes/ rales/ or rhonchi. HEART:  Regular Rhythm; without murmurs/ rubs/ or gallops. ABDOMEN:  Obese,soft & nontender; normal bowel sounds; no organomegaly or masses detected. EXT: without deformities, mild arthritic changes; no varicose veins/ +venous insuffic/ tr edema. NEURO:  CN's intact;  no focal neuro deficits... DERM:  No lesions noted; no rash etc...       Impression & Recommendations:  Problem # 1:  HYPERTENSION (ICD-401.9) Assessment: Unchanged Needs diet and exercise program... no salt, count calories and get wt down. Rec ~  continue same meds for now... may need to incr meds if wt doesn't respond... f/u 3 months.  Her updated medication list for this problem includes:    Atacand 32 Mg Tabs (Candesartan cilexetil) .Marland Kitchen... Take 1 tablet by mouth once a day    Furosemide 40 Mg Tabs (Furosemide) .Marland Kitchen... 1 tab by mouth once daily   Problem # 2:  HYPERLIPIDEMIA (ICD-272.4) Assessment: Unchanged On ?diet alone... needs f/u FLP... return for labs.  Problem # 3:  MORBID OBESITY (ICD-278.01) As discussed... consider Bariatric approach, and call [facility].  Problem # 4:  Hx of PSEUDOTUMOR CEREBRI (ICD-348.2) No recurrent or persistant symptoms...  Complete Medication List: 1)  Adult Aspirin Low Strength 81 Mg Tbdp (Aspirin) .Marland Kitchen.. 1 tab daily 2)  Atacand 32 Mg Tabs (Candesartan cilexetil) .... Take 1 tablet by mouth once a day 3)  Furosemide 40 Mg Tabs (Furosemide) .Marland Kitchen.. 1 tab by mouth once daily 4)  Prilosec Otc 20 Mg Tbec (Omeprazole magnesium) .Marland Kitchen.. 1 tab by mouth once daily 5)  Claritin 10 Mg Tabs (Loratadine) .Marland Kitchen.. 1 tab daily as needed for allergies...   Patient Instructions: 1)  Today we refilled your meds for 2009... 2)  You may use PRILOSEC OTC 1 tab daily, Aspirin 81 mg one tab daily, and  Claritin or Zyrtek one tab daily for allergies.Marland KitchenMarland Kitchen 3)  For constipation- try MIRALAX one capful in water daily.Marland KitchenMarland Kitchen 4)  You need to diet, exercise and lose weight!!! 5)  Let's plan a follow up visit in 3-4 months to check your BP and see how you are doing w/ the weight loss program!    Prescriptions: PRILOSEC OTC 20 MG  TBEC (OMEPRAZOLE MAGNESIUM) 1 tab by mouth once daily  #30 x prn   Entered and Authorized by:   Michele Mcalpine MD   Signed by:   Michele Mcalpine MD on 11/26/2007   Method used:   Print then Give to Patient   RxID:   [id number] FUROSEMIDE 40 MG  TABS (FUROSEMIDE) 1 tab by mouth once daily  #30 x prn   Entered and Authorized by:   Michele Mcalpine MD   Signed by:   Michele Mcalpine MD on 11/26/2007   Method used:   Print then Give to Patient   RxID:   [id number] ATACAND 32 MG TABS (CANDESARTAN CILEXETIL) Take 1 tablet by mouth  once a day  #30 x prn   Entered and Authorized by:   Michele Mcalpine MD   Signed by:   Michele Mcalpine MD on 11/26/2007   Method used:   Print then Give to Patient   RxID:   [id number]  ]

## 2010-11-21 NOTE — Progress Notes (Signed)
Summary: REFILL  Phone Note Call from Patient   Caller: Patient Call For: Susan [name] Summary of Call: NEED METHOCARBAMOL 500MG  AND TRAMADOL HCL 50MG  [facility] Initial call taken by: Rickard Patience,  September 21, 2009 9:00 AM  Follow-up for Phone Call        this has already been done. Marijo File CMA  September 21, 2009 9:45 AM

## 2010-11-21 NOTE — Letter (Signed)
Summary: Patient Notice-Endo Biopsy Results  [facility]  [address] [place], [place] [address]   Phone: [phone]  Fax: [phone]        November 11, 2009 MRN: [id number]    Susan [name] [address] [place], [place]  [address]    Dear [name],  I am pleased to inform you that the biopsies taken during your recent endoscopic examination did not show any evidence of cancer upon pathologic examination.  Additional information/recommendations:  __No further action is needed at this time.  Please follow-up with      your primary care physician for your other healthcare needs.  __ Please call [phone] to schedule a return visit to review      your condition.  _x_ Continue with the treatment plan as outlined on the day of your      exam.  __ You should have a repeat endoscopic examination for this problem              in _ months/years.   Please call us if you are having persistent problems or have questions about your condition that have not been fully answered at this time.  Sincerely,  Mardella Layman MD [facility]  This letter has been electronically signed by your physician.  Appended Document: Patient Notice-Endo Biopsy Results Letter mailed 1.24.11.

## 2010-11-21 NOTE — Procedures (Signed)
Summary: Upper Endoscopy  Patient: [name] Note: All result statuses are Final unless otherwise noted.  Tests: (1) Upper Endoscopy (EGD)   EGD Upper Endoscopy       DONE     [facility]     [address]     [place], [place]  [address]           ENDOSCOPY PROCEDURE REPORT           PATIENT:  Susan [name], Susan [name]  MR#:  [id number]     BIRTHDATE:  05-21-1956, 53 yrs. old  GENDER:  female           ENDOSCOPIST:  Vania Rea. Jarold Motto, MD, [facility]     Referred by:           PROCEDURE DATE:  11/09/2009     PROCEDURE:  EGD with biopsy     ASA CLASS:  Class II     INDICATIONS:  GLOBUS SENSATION           MEDICATIONS:   Fentanyl 50 mcg IV, Versed 5 mg IV, glycopyrrolate     (Robinal) 0.2 mg IV     TOPICAL ANESTHETIC:  Exactacain Spray           DESCRIPTION OF PROCEDURE:   After the risks benefits and     alternatives of the procedure were thoroughly explained, informed     consent was obtained.  The LB GIF-H180 [id number] endoscope was     introduced through the mouth and advanced to the second portion of     the duodenum, without limitations.  The instrument was slowly     withdrawn as the mucosa was fully examined.     <<PROCEDUREIMAGES>>           There were multiple polyps identified. in the fundus. MULTIPLE     2-3MM BENIGN FUNDAL POLYPS BIOPSIED.  The duodenal bulb was normal     in appearance, as was the postbulbar duodenum.  The esophagus and     gastroesophageal junction were completely normal in appearance.     EXTRINSIC COMPRESSION OF UPPER ESOPHAGUS.??? VASCULAR,TRACHEAL     MASS, ETC.    Retroflexed views revealed no abnormalities.    The     scope was then withdrawn from the patient and the procedure     completed.           COMPLICATIONS:  None           ENDOSCOPIC IMPRESSION:     1) Polyps, multiple in the fundus     2) Normal duodenum     3) Normal esophagus     1.POSSIBE ESOPHAGEAL EXTRINSIC COMPRESSION     2.GERD ON PPI RX.  RECOMMENDATIONS:     1) continue PPI     CHEST CT SCAN ORDERED.           REPEAT EXAM:  No           ______________________________     Vania Rea. Jarold Motto, MD, [facility]           CC:           n.     eSIGNED:   Vania Rea. Patterson at 11/09/2009 10:44 AM           Everlene Farrier, [id number]  Note: An exclamation mark (!) indicates a result that was not dispersed into the flowsheet. Document Creation Date: 11/09/2009 10:44 AM _______________________________________________________________________  (1) Order result status: Final Collection or observation date-time: 11/09/2009  10:33 Requested date-time:  Receipt date-time:  Reported date-time:  Referring Physician:   Ordering Physician: Sheryn Bison [phone]) Specimen Source:  Source: Launa Grill Order Number: [id number] Lab site:

## 2010-11-21 NOTE — Letter (Signed)
Summary: [facility]  [facility]   Imported By: Kassie Mends 01/03/2010 12:41:33  _____________________________________________________________________  External Attachment:    Type:   Image     Comment:   External Document

## 2010-11-21 NOTE — Assessment & Plan Note (Signed)
Summary: Acute NP office visit - asthma   Copy to:  Alroy Dust, MD  Primary Provider/Referring Provider:  Alroy Dust, MD   CC:  increased SOB, wheezing, prod cough with clear mucus, chills/sweats, and dizziness x4days - states has 2 days of zpak left.  History of Present Illness: 55 year old female with known history of HTN, Hyperlipidemia, DM    November 17, 2009--Presents for an acute office visit. Complains of increased SOB, wheezing, prod cough with clear mucus, chills/sweats, dizziness x4days - states has 2 days of zpak left. Zpack called in 3 days ago. Cough is getting bad. Cant sleep. Wheezing is worse yestreday. Denies chest pain,  , orthopnea, hemoptysis, fever, n/v/d, edema, headache.   Medications Prior to Update: 1)  Adult Aspirin Low Strength 81 Mg  Tbdp (Aspirin) .Marland Kitchen.. 1 Tab Daily 2)  Atacand 32 Mg Tabs (Candesartan Cilexetil) .... Take 1 Tablet By Mouth Once A Day 3)  Furosemide 40 Mg  Tabs (Furosemide) .Marland Kitchen.. 1 Tab By Mouth Once Daily 4)  Zantac 75 75 Mg Tabs (Ranitidine Hcl) .... Take 1-2 Tabs As Needed For Stomach Acid... 5)  Methocarbamol 500 Mg Tabs (Methocarbamol) .... Take One Tablet By Mouth Four Times Daily As Needed For Muscle Spasms 6)  Tramadol Hcl 50 Mg Tabs (Tramadol Hcl) .... Take One Tablet By Mouth Four Times Daily As Needed For Pain 7)  Alprazolam 0.5 Mg Tabs (Alprazolam) .... Take 1/2 To 1 Tab By Mouth Up To Three Times A Day As Needed... 8)  Dexilant 60 Mg Cpdr (Dexlansoprazole) .... Take 1 Cap By Mouth Once Daily- 30 Min Before The 1st Meal of The Day. 9)  Zithromax Z-Pak 250 Mg Tabs (Azithromycin) .... Take As Directed  Current Medications (verified): 1)  Adult Aspirin Low Strength 81 Mg  Tbdp (Aspirin) .Marland Kitchen.. 1 Tab Daily 2)  Atacand 32 Mg Tabs (Candesartan Cilexetil) .... Take 1 Tablet By Mouth Once A Day 3)  Furosemide 40 Mg  Tabs (Furosemide) .Marland Kitchen.. 1 Tab By Mouth Once Daily 4)  Zantac 75 75 Mg Tabs (Ranitidine Hcl) .... Take 1-2 Tabs As Needed For  Stomach Acid... 5)  Methocarbamol 500 Mg Tabs (Methocarbamol) .... Take One Tablet By Mouth Four Times Daily As Needed For Muscle Spasms 6)  Tramadol Hcl 50 Mg Tabs (Tramadol Hcl) .... Take One Tablet By Mouth Four Times Daily As Needed For Pain 7)  Alprazolam 0.5 Mg Tabs (Alprazolam) .... Take 1/2 To 1 Tab By Mouth Up To Three Times A Day As Needed... 8)  Dexilant 60 Mg Cpdr (Dexlansoprazole) .... Take 1 Cap By Mouth Once Daily- 30 Min Before The 1st Meal of The Day. 9)  Zithromax Z-Pak 250 Mg Tabs (Azithromycin) .... Take As Directed  Allergies: 1)  ! Codeine 2)  ! Penicillin 3)  ! Simvastatin  Past History:  Past Medical History: Last updated: 11/03/2009 HYPERTENSION (ICD-401.9) Hx of CHEST PAIN UNSPECIFIED (ICD-786.50) HYPERLIPIDEMIA (ICD-272.4) DIABETES MELLITUS, BORDERLINE (ICD-790.29) MORBID OBESITY (ICD-278.01) OTHER DYSPHAGIA (ICD-787.29) IRRITABLE BOWEL SYNDROME (ICD-564.1) SPONDYLOSIS, LUMBAR (ICD-721.3) Hx of HEADACHE (ICD-784.0) Hx of PSEUDOTUMOR CEREBRI (ICD-348.2) ANXIETY (ICD-300.00) ALOPECIA (ICD-704.00)  Past Surgical History: Last updated: 10/31/2009 CSection 1977 w/ wound complication  Family History: Last updated: 11/03/2009 Father alive age 33 w/ hx HBP, on dialysis, and DM  Mother, Migdalia Dk, age 6, hx HBP DM Arrhythmia Chol etc... 3 Siblings- all brothers a/w... Family History of Colon Cancer:MGM Family History of Breast Cancer:Mother   Social History: Last updated: 11/03/2009 Divorced 1 daughter, age 25 &  2 grandchildren... Never smoked No alcohol Retired from [employer], works for [employer]- 3rd shift. Daily Caffeine Use: 3 daily  Illicit Drug Use - no  Risk Factors: Smoking Status: never (01/23/2008)  Review of Systems      See HPI  Vital Signs:  Patient profile:   55 year old female Height:      61 inches Weight:      233.38 pounds BMI:     44.26 O2 Sat:      98 % on Room air Temp:     99.0 degrees F oral Pulse rate:    107 / minute BP sitting:   136 / 80  (right arm) Cuff size:   large  Vitals Entered By: Boone Master CNA (November 17, 2009 10:35 AM)  O2 Flow:  Room air CC: increased SOB, wheezing, prod cough with clear mucus, chills/sweats, dizziness x4days - states has 2 days of zpak left Is Patient Diabetic? No Comments Medications reviewed with patient Daytime contact number verified with patient. Boone Master CNA  November 17, 2009 10:36 AM    Physical Exam  Additional Exam:  WD, Obese, [name] in NAD... GENERAL:  Alert & oriented; pleasant & cooperative. HEENT:  [place]/AT, EOM-wnl, PERRLA, EACs-clear, TMs-wnl, NOSE-clear, THROAT-clear & wnl. NECK:  Supple w/ fairROM; no JVD; normal carotid impulses w/o bruits; no thyromegaly or nodules palpated; no lymphadenopathy. CHEST:  Coarse BS w/ no wheezing noted.  HEART:  Regular Rhythm; without murmurs/ rubs/ or gallops. ABDOMEN:  Obese, soft & nontender; + panniculus; normal bowel sounds; no organomegaly or masses detected. EXT:  mild arthritic deformities bilat knees; no varicose veins/ +venous insuffic/ tr edema. NEURO:  CN's intact;  no focal neuro deficits... DERM:  No lesions noted; no rash etc...     Impression & Recommendations:  Problem # 1:  ASTHMA (ICD-493.90) Exacerbation :  REC: xopenex neb tx  Finish zpack Mcuinex DM two times a day as needed cough/congestion Prednisone taper as directed.  Hydromet 1-2 tsp every 4-6 hr as needed cough, may make you sleepy.  Please contact office for sooner follow up if symptoms do not improve or worsen   Medications Added to Medication List This Visit: 1)  Hydrocodone-homatropine 5-1.5 Mg/39ml Syrp (Hydrocodone-homatropine) .Marland Kitchen.. 1-2 tsp every 4-6 hr as needed cough 2)  Prednisone 10 Mg Tabs (Prednisone) .... 4 tabs for 2 days, then 3 tabs for 2 days, 2 tabs for 2 days, then 1 tab for 2 days, then stop  Complete Medication List: 1)  Adult Aspirin Low Strength 81 Mg Tbdp (Aspirin) .Marland Kitchen.. 1 tab  daily 2)  Atacand 32 Mg Tabs (Candesartan cilexetil) .... Take 1 tablet by mouth once a day 3)  Furosemide 40 Mg Tabs (Furosemide) .Marland Kitchen.. 1 tab by mouth once daily 4)  Zantac 75 75 Mg Tabs (Ranitidine hcl) .... Take 1-2 tabs as needed for stomach acid.Marland KitchenMarland Kitchen 5)  Methocarbamol 500 Mg Tabs (Methocarbamol) .... Take one tablet by mouth four times daily as needed for muscle spasms 6)  Tramadol Hcl 50 Mg Tabs (Tramadol hcl) .... Take one tablet by mouth four times daily as needed for pain 7)  Alprazolam 0.5 Mg Tabs (Alprazolam) .... Take 1/2 to 1 tab by mouth up to three times a day as needed... 8)  Dexilant 60 Mg Cpdr (Dexlansoprazole) .... Take 1 cap by mouth once daily- 30 min before the 1st meal of the day. 9)  Zithromax Z-pak 250 Mg Tabs (Azithromycin) .... Take as directed 10)  Hydrocodone-homatropine 5-1.5  Mg/13ml Syrp (Hydrocodone-homatropine) .Marland Kitchen.. 1-2 tsp every 4-6 hr as needed cough 11)  Prednisone 10 Mg Tabs (Prednisone) .... 4 tabs for 2 days, then 3 tabs for 2 days, 2 tabs for 2 days, then 1 tab for 2 days, then stop  Patient Instructions: 1)  Finish zpack 2)  Mcuinex DM two times a day as needed cough/congestion 3)  Prednisone taper as directed.  4)  Hydromet 1-2 tsp every 4-6 hr as needed cough, may make you sleepy.  5)  Please contact office for sooner follow up if symptoms do not improve or worsen  Prescriptions: PREDNISONE 10 MG TABS (PREDNISONE) 4 tabs for 2 days, then 3 tabs for 2 days, 2 tabs for 2 days, then 1 tab for 2 days, then stop  #20 x 0   Entered and Authorized by:   Rubye Oaks NP   Signed by:   Rubye Oaks NP on 11/17/2009   Method used:   Electronically to        [id number] (retail)       [address] [place], [place]  [address]       Ph: [phone]       Fax: [phone]   RxID:   [id number] HYDROCODONE-HOMATROPINE 5-1.5 MG/5ML SYRP (HYDROCODONE-HOMATROPINE) 1-2 tsp every 4-6 hr as needed cough  #8 oz x 0    Entered and Authorized by:   Rubye Oaks NP   Signed by:   Rori Goar NP on 11/17/2009   Method used:   Print then Give to Patient   RxID:   [id number]    Immunization History:  Influenza Immunization History:    Influenza:  declines (11/17/2009)  Pneumovax Immunization History:    Pneumovax:  n/a (11/17/2009)   Appended Document: Acute NP office visit - asthma   Appended Document: neb documentation    Clinical Lists Changes  Orders: Added new Service order of Nebulizer Tx ([id number]) - Signed

## 2010-11-21 NOTE — Progress Notes (Signed)
Summary: cough congestion back pain  Phone Note Call from Patient   Caller: Patient Call For: nadel Summary of Call: coughing sore throat runny nose pain in back when she breathe [facility] Initial call taken by: Rickard Patience,  November 15, 2009 8:34 AM  Follow-up for Phone Call        Pt c/o chills, sore throat, runny nose, wheezing, non-productive cough, and middle back pain when she takes a deep breath x 1 day. Pt denies n/v/d, fever. Please advise, thanks. Zackery Barefoot CMA  November 15, 2009 9:01 AM   Additional Follow-up for Phone Call Additional follow up Details #1::        per SN---zpak #1  take as directed and use delsym otc  2 tsp by mouth two times a day and increase fluids.  thanks Randell Loop CMA  November 15, 2009 9:26 AM     Additional Follow-up for Phone Call Additional follow up Details #2::    called  and spoke with pt.  pt aware rx sent to pharmacy and also to take otc delsym and increase fluids.  pt verbalized understanding.  Aundra Millet Reynolds LPN  November 15, 2009 9:33 AM   New/Updated Medications: ZITHROMAX Z-PAK 250 MG TABS (AZITHROMYCIN) take as directed Prescriptions: ZITHROMAX Z-PAK 250 MG TABS (AZITHROMYCIN) take as directed  #1 x 0   Entered by:   Arman Filter LPN   Authorized by:   Michele Mcalpine MD   Signed by:   Arman Filter LPN on 81/19/1478   Method used:   Electronically to        [id number] (retail)       [address] [place], [place]  [address]       Ph: [phone]       Fax: [phone]   RxID:   [id number]

## 2011-02-07 ENCOUNTER — Telehealth: Payer: Self-pay | Admitting: Pulmonary Disease

## 2011-02-07 MED ORDER — AZITHROMYCIN 250 MG PO TABS: ORAL_TABLET | ORAL | Status: AC

## 2011-02-07 MED ORDER — HYDROCODONE-HOMATROPINE 5-1.5 MG/5ML PO SYRP: ORAL_SOLUTION | ORAL | Status: DC

## 2011-02-07 NOTE — Telephone Encounter (Signed)
Per SN----ok to have zpak #1 take as directed with no refills and  hydromet #4oz   1 tsp every 6 hours prn for cough. thanks

## 2011-02-07 NOTE — Telephone Encounter (Signed)
Spoke w/ pt and she c/o dry cough, sore throat, stuffy nose, ear ache in both ears, water and itchy eyes x 4 days. Pt has tried taking sudafed, benadryl, and delsym cough syrup but has had no relief. Pt would like something else called in. Pt has an upcoming apt 02/13/11 w/ SN. Please advise Dr. Kriste Basque Thanks  Allergies  Allergen Reactions  . Codeine     REACTION: causes her heart to race  . Penicillins     REACTION: rash  . Simvastatin     REACTION: inotlerant to this med--made her unable to sleep and headache    Carver Fila, CMA

## 2011-02-07 NOTE — Telephone Encounter (Signed)
Spoke w/ pt and advised her of sn recs. Pt verbalized understanding. I called and spoke w/ [facility] and advised them of rx and directions for pt.

## 2011-02-09 ENCOUNTER — Encounter: Payer: Self-pay | Admitting: Pulmonary Disease

## 2011-02-13 ENCOUNTER — Ambulatory Visit (INDEPENDENT_AMBULATORY_CARE_PROVIDER_SITE_OTHER)
Admission: RE | Admit: 2011-02-13 | Discharge: 2011-02-13 | Disposition: A | Discharge status: Home / Self Care | Payer: Managed Care, Other (non HMO) | Source: Ambulatory Visit | Attending: Pulmonary Disease | Admitting: Pulmonary Disease

## 2011-02-13 ENCOUNTER — Ambulatory Visit (INDEPENDENT_AMBULATORY_CARE_PROVIDER_SITE_OTHER): Payer: Managed Care, Other (non HMO) | Admitting: Pulmonary Disease

## 2011-02-13 ENCOUNTER — Encounter: Payer: Self-pay | Admitting: Pulmonary Disease

## 2011-02-13 DIAGNOSIS — R7309 Other abnormal glucose: Secondary | ICD-10-CM

## 2011-02-13 DIAGNOSIS — M47817 Spondylosis without myelopathy or radiculopathy, lumbosacral region: Secondary | ICD-10-CM

## 2011-02-13 DIAGNOSIS — F411 Generalized anxiety disorder: Secondary | ICD-10-CM

## 2011-02-13 DIAGNOSIS — I10 Essential (primary) hypertension: Secondary | ICD-10-CM

## 2011-02-13 DIAGNOSIS — E785 Hyperlipidemia, unspecified: Secondary | ICD-10-CM

## 2011-02-13 DIAGNOSIS — J209 Acute bronchitis, unspecified: Secondary | ICD-10-CM

## 2011-02-13 DIAGNOSIS — R1319 Other dysphagia: Secondary | ICD-10-CM

## 2011-02-13 DIAGNOSIS — K589 Irritable bowel syndrome without diarrhea: Secondary | ICD-10-CM

## 2011-02-13 MED ORDER — CANDESARTAN CILEXETIL 32 MG PO TABS: 32.0000 mg | ORAL_TABLET | Freq: Every day | ORAL | Status: DC

## 2011-02-13 MED ORDER — ALPRAZOLAM 0.5 MG PO TABS: 0.5000 mg | ORAL_TABLET | Freq: Three times a day (TID) | ORAL | Status: DC | PRN

## 2011-02-13 MED ORDER — MOXIFLOXACIN HCL 400 MG PO TABS: 400.0000 mg | ORAL_TABLET | Freq: Every day | ORAL | Status: DC

## 2011-02-13 MED ORDER — METHYLPREDNISOLONE ACETATE 80 MG/ML IJ SUSP
80.0000 mg | Freq: Once | INTRAMUSCULAR | Status: AC
  Administered 2011-02-13: 80 mg via INTRA_ARTICULAR

## 2011-02-13 MED ORDER — FUROSEMIDE 40 MG PO TABS: 40.0000 mg | ORAL_TABLET | Freq: Every day | ORAL | Status: DC

## 2011-02-13 MED ORDER — METHOCARBAMOL 500 MG PO TABS: 500.0000 mg | ORAL_TABLET | Freq: Three times a day (TID) | ORAL | Status: DC

## 2011-02-13 MED ORDER — PREDNISONE (PAK) 5 MG PO TABS: ORAL_TABLET | ORAL | Status: DC

## 2011-02-13 MED ORDER — TRAMADOL HCL 50 MG PO TABS: 50.0000 mg | ORAL_TABLET | Freq: Three times a day (TID) | ORAL | Status: DC

## 2011-02-13 NOTE — Progress Notes (Signed)
Subjective:    Patient ID: Susan [name], female    DOB: 1956-06-09, 55 y.o.   MRN: [id number]  HPI [name] here for a yearly follow up visit... she has mult medical problems including: HBP, Chol, CWP, HA's w/ pseudotumor cerebri, and anxiety...   ~ April 11, 2010:  she had GI eval from [facility] 1/11 w/ EGD showing benign gastric polyps, & subseq CT Chest (?extrinsic compression on esoph- neg CT- NAD, but coronary calcif seen)...  labs reviewed & we decided to f/u on next OV... she has done a fab job w/ diet & decr 17# to 223# today.  ~  August 15, 2010:   she has lost 5# more down to 218# today... saw Derm DrJordan recently w/ seb dermatitis & given Doryx, Olux-E foam, Zinc shampoo, & Clobetasol shampoo... BP controlled on meds;  trying to improve lipids w/ diet alone as she has been intol to all statine- offered low dose intermittent Crestor but she declines;  BS improved w/ wt reduction;  refills written today, she declines flu shot.  ~  February 13, 2011:  45mo ROV- c/o recent URI w/ cough, green sputum, head & chest congestion, sore throat, low grade temp, etc;  She was visiting [place] in [place], we called in ZPak & Hycodan but no better ==> Decided to check CXR (low lung vols, ectatic Ao, NAD) & Rx w/ Depo/ Dosepak, Avelox, Mucinex, etc...  Otherw stable> BP con5trolled on meds;  She now agrees to go to the Lipid clinic to help w/ her Chol & known coronary calcif;  BS remains OK on diet alone but wt loss has ceased & needs better diet/ exercise/ etc;  Requests refill meds...         Problem List:    HYPERTENSION (ICD-401.9) - controlled on ATACAND 32mg /d and FUROSEMIDE 40mg /d...  ~  10/11:  BP today= 126/84 & taking meds regularly/ tolerating well... checks BP occas at home and usually runs 120's/ 80's... denies HA, fatigue, visual changes, CP, palipit, dizziness, syncope, dyspnea, edema, etc... we discussed the importance of low sodium & weight reduction for her BP control. ~  4/12:   BP= 128/88 & asked to monitor BP at home more often...  Hx of CHEST PAIN UNSPECIFIED (ICD-786.50) - on ASA 81mg /d... prev hx CWP.Marland Kitchen.  ~  baseline EKG = NSR, WNL.Marland Kitchen. ~  CT Chest 1/11 showed incidental calcif in coronary art, otherw CT was neg...  VENOUS INSUFFICIENCY (ICD-459.81) - she has trace edema & knows to elim salt, elevate legs, wear support hose, & take her Lasix.  HYPERLIPIDEMIA (ICD-272.4) - on diet Rx alone... she tried Simv40 in 2009 & Prav40 in 2010 but INTOL w/ HA & insomnia... refuses other statin meds or Lipid Clinic help... ~  FLP 4/07 showed TChol 191, TG 70, HDL 61, LDL 116 ~  FLP 2/09 showed TChol 224, Tg 85, HDL 71, LDL 135 ~  FLP 12/09 showed TChol 207, TG 73, HDL 58, LDL 138 ~  FLP 4/10 showed Tchol 222, TG 92, HDL 70, LDL 135 ~  FLP 1/11 showed TChol 206, TG 77, HDL 66, LDL 130... not at goal, declines LipidClinic. ~  FLP 10/11 showed TChol 228, TG 64, HDL 76, LDL 134... not at goal, declines low dose intermit Crestor. ~  4/12:  We reviewed her prev results & the need for meds> she agrees to Lipid Clinic referral...  DIABETES MELLITUS, BORDERLINE (ICD-790.29) - FBS 90-115 over the last few yrs... diet Rx. ~  labs 2/09 showed BS= 110, HgA1c= 6.2.Marland Kitchen. rec> diet Rx, get wt down. ~  labs 12/09 showed BS= 102, HgA1c= 6.2 ~  labs 4/10 showed BS= 110, A1c= 6.4 ~  labs 1/11 showed BS= 103... keep up the godd work. ~  labs 10/11 showed BS= 79, A1c= 6.0  MORBID OBESITY (ICD-278.01) - we have discussed Bariatric Surg and she has several relatives who have had surg... "I'm afraid"- pt encouraged to seek counsel at [facility]. ~  weight 2/09 = 247#... 5'1" tall... BMI=47...  ~  weight 3/10 = 256#... we reviewed diet + exercise therapy... ~  weight 1/11 = 240# ~  weight 6/11 = 223#... great job!!! ~  Weight 10/11 = 217# ~  Weight 4/12 = 219#  OTHER DYSPHAGIA (ICD-787.29) - c/o upper esoph dysphagia w/ discomfort... no response to Zantac, but sl better w/ Alpraz...  referred to GI, DrPatterson 1/11 w/ EGD showing mult benign gastric polyps, ? extrinsic compression> subseq CT Chest was neg x for coronary calcif seen... globus symptoms improved on HYDROXYZINE 25mg  Prn & she continues on ZANTAC vs PRILOSEC...  IRRITABLE BOWEL SYNDROME (ICD-564.1) - last colon 5/06 by DrPatterson... normal exam.  SPONDYLOSIS, LUMBAR (ICD-721.3) - she had MRI 1/08 by DrKramer showing bulging discs and some facet arthropathy... takes ROBAXIN 500mg  Tid Prn, and TRAMADOL 50mg  Tid Prn...  Hx of HEADACHE (ICD-784.0) - eval in the [facility]... she uses Ibuprofen Prn...  Hx of PSEUDOTUMOR CEREBRI (ICD-348.2) - prev followed by Deland Pretty and DrMartin at [place], plus DrLove in [place]... ~  Mar10: pt had eye check recently at [facility] and was told OK...  ANXIETY (ICD-300.00) - has used XANAX 0.5mg  as needed in past...  ALOPECIA (ICD-704.00) & Seborrheic Dermatitis treated by DrJordan for Derm...   No past surgical history on file.   Outpatient Encounter Prescriptions as of 02/13/2011  Medication Sig Dispense Refill  . ALPRAZolam (XANAX) 0.5 MG tablet Take 0.5 mg by mouth 3 (three) times daily as needed.        Marland Kitchen aspirin 81 MG tablet Take 81 mg by mouth daily.        . calcium carbonate (OS-CAL) 600 MG TABS Take 600 mg by mouth daily.        . candesartan (ATACAND) 32 MG tablet Take 32 mg by mouth daily.        . furosemide (LASIX) 40 MG tablet Take 40 mg by mouth daily.        Marland Kitchen HYDROcodone-homatropine (HYDROMET) 5-1.5 MG/5ML syrup 1 tsp every 6 hrs as needed for cough  120 mL  0  . hydrOXYzine (ATARAX) 25 MG tablet Take 25 mg by mouth every 4 (four) hours as needed.        . methocarbamol (ROBAXIN) 500 MG tablet Take 500 mg by mouth 4 (four) times daily as needed.        . Multiple Vitamin (MULTIVITAMIN) capsule Take 1 capsule by mouth daily.        . ranitidine (ZANTAC) 75 MG tablet Take 1-2 tablets as needed for stomach acid       . traMADol (ULTRAM) 50 MG tablet  Take 50 mg by mouth every 6 (six) hours as needed.        Marland Kitchen azithromycin (ZITHROMAX) 250 MG tablet Take 2 tablets by mouth on day 1, followed by 1 tablet by mouth daily for 4 days. Take as directed  6 each  0    Allergies  Allergen Reactions  .  Codeine     REACTION: causes her heart to race  . Penicillins     REACTION: rash  . Simvastatin     REACTION: inotlerant to this med--made her unable to sleep and headache    Review of Systems         See HPI - all other systems neg except as noted... The patient complains of dyspnea on exertion.  The patient denies anorexia, fever, weight loss, weight gain, vision loss, decreased hearing, hoarseness, chest pain, syncope, peripheral edema, prolonged cough, headaches, hemoptysis, abdominal pain, melena, hematochezia, severe indigestion/heartburn, hematuria, incontinence, muscle weakness, suspicious skin lesions, transient blindness, difficulty walking, depression, unusual weight change, abnormal bleeding, enlarged lymph nodes, and angioedema.     Objective:   Physical Exam     WD, Obese, [name] in NAD... GENERAL:  Alert & oriented; pleasant & cooperative. HEENT:  [place]/AT, EOM-wnl, PERRLA, EACs-clear, TMs-wnl, NOSE-clear, THROAT-clear & wnl. NECK:  Supple w/ fairROM; no JVD; normal carotid impulses w/o bruits; no thyromegaly or nodules palpated; no lymphadenopathy. CHEST:  Clear to P & A; without wheezes/ rales/ or rhonchi. HEART:  Regular Rhythm; without murmurs/ rubs/ or gallops. ABDOMEN:  Obese, soft & nontender; + panniculus; normal bowel sounds; no organomegaly or masses detected. EXT:  mild arthritic deformities bilat knees; no varicose veins/ +venous insuffic/ tr edema. NEURO:  CN's intact;  no focal neuro deficits... DERM:  No lesions noted; no rash etc...   Assessment & Plan:   HBP>  Fair control on her ARB/ Diuretic; needs better job w/ wt reduction, diet, etc & we discussed this...  HYPERLIPID>  We reviewed all her prev values  and the need for med Rx; she has agreed to go to the Lipid Clinic for their review...  DM>  This has been well controlled on diet alone, min DM, but at risk for worsening glucose control & needs to lose the wt...  GI>  Hx dyspagia, IBS, etc...globus symptoms improved w/ Hydroxyzine, neg colon in 2006.  LBP>  Stable on Tramadol & Robaxin... Needs to lose the weight...  Anxiety>  On Alpraz prn.Marland KitchenMarland Kitchen

## 2011-02-13 NOTE — Patient Instructions (Addendum)
Today we updated your med list in our EPIC system...    Continue your current meds, and we refilled the meds you requested...  For your Upper Resp Infection:      We gave you a Depo shot & a Pred Dosepak for the inflammation...    We wrote a new prescription for AVELOX, a stronger antibiotic, to take one daily til gone...    You should use the OTC MUCINEX 1-2 tabs twice daily w/ fluids for the thick mucus...    You may use the Hycodan cough syrup as needed...  Today we did your follow up CXR...    Please call the PHONE TREE in a few days for your results...    Dial [id number] & when prompted enter your patient number followed by the # symbol...    Your patient number is:   [id number]#  Let's get on track w/ our diet & exercise program...    We will arrange for any appt in the Lipid Clinic...  The goal is to lose another 10-15 lbs... Let's plan a follow up appt in  6 months, sooner if needed.Marland KitchenMarland Kitchen

## 2011-02-14 ENCOUNTER — Telehealth: Payer: Self-pay | Admitting: Pulmonary Disease

## 2011-02-14 MED ORDER — PREDNISONE (PAK) 5 MG PO TABS: ORAL_TABLET | ORAL | Status: DC

## 2011-02-14 MED ORDER — DOXYCYCLINE HYCLATE 100 MG PO TABS: 100.0000 mg | ORAL_TABLET | Freq: Two times a day (BID) | ORAL | Status: AC

## 2011-02-14 NOTE — Telephone Encounter (Signed)
Pt states she started Avelox yesterday and her tongue and throat started swelling. She took Benadryl and says she is not having any problems now. She is requesting a substitute be called to the pharmacy. Avelox added to pt's allergies. Pls advise. Allergies  Allergen Reactions  . Avelox (Moxifloxacin Hcl In Nacl) Anaphylaxis    Pt c/o swelling of her throat and tongue  . Codeine     REACTION: causes her heart to race  . Penicillins     REACTION: rash  . Simvastatin     REACTION: inotlerant to this med--made her unable to sleep and headache

## 2011-02-14 NOTE — Telephone Encounter (Signed)
Doxycycline 100mg  Twice daily  For 7 days #14 , no refills take with food  Stop avelox.  Place on allergy list.  Please contact office for sooner follow up if symptoms do not improve or worsen or seek emergency care

## 2011-02-14 NOTE — Telephone Encounter (Signed)
Pt aware of Doxy rx and will call for sooner appt if she does not improve or seek emergency help if worse. Rx sent to [facility].

## 2011-02-15 ENCOUNTER — Encounter: Payer: Managed Care, Other (non HMO) | Admitting: *Deleted

## 2011-02-15 ENCOUNTER — Ambulatory Visit: Payer: Managed Care, Other (non HMO)

## 2011-02-16 ENCOUNTER — Other Ambulatory Visit: Payer: Self-pay | Admitting: Pulmonary Disease

## 2011-02-16 ENCOUNTER — Other Ambulatory Visit (INDEPENDENT_AMBULATORY_CARE_PROVIDER_SITE_OTHER): Payer: Managed Care, Other (non HMO) | Admitting: *Deleted

## 2011-02-16 DIAGNOSIS — E7800 Pure hypercholesterolemia, unspecified: Secondary | ICD-10-CM

## 2011-02-16 DIAGNOSIS — Z79899 Other long term (current) drug therapy: Secondary | ICD-10-CM

## 2011-02-16 LAB — HEPATIC FUNCTION PANEL
ALT: 12 U/L (ref 0–35)
AST: 16 U/L (ref 0–37)
Albumin: 3.8 g/dL (ref 3.5–5.2)
Alkaline Phosphatase: 83 U/L (ref 39–117)
Bilirubin, Direct: 0.1 mg/dL (ref 0.0–0.3)
Total Bilirubin: 0.5 mg/dL (ref 0.3–1.2)
Total Protein: 7.3 g/dL (ref 6.0–8.3)

## 2011-02-16 LAB — LDL CHOLESTEROL, DIRECT: Direct LDL: 130.3 mg/dL

## 2011-02-16 LAB — LIPID PANEL
Cholesterol: 224 mg/dL — ABNORMAL HIGH (ref 0–200)
HDL: 86.3 mg/dL (ref >39.00)
Total CHOL/HDL Ratio: 3
Triglycerides: 43 mg/dL (ref 0.0–149.0)
VLDL: 8.6 mg/dL (ref 0.0–40.0)

## 2011-02-22 ENCOUNTER — Ambulatory Visit (INDEPENDENT_AMBULATORY_CARE_PROVIDER_SITE_OTHER): Payer: Managed Care, Other (non HMO)

## 2011-02-22 ENCOUNTER — Encounter: Payer: Self-pay | Admitting: Cardiology

## 2011-02-22 DIAGNOSIS — E785 Hyperlipidemia, unspecified: Secondary | ICD-10-CM

## 2011-02-22 MED ORDER — ROSUVASTATIN CALCIUM 5 MG PO TABS: 5.0000 mg | ORAL_TABLET | Freq: Every day | ORAL | Status: DC

## 2011-02-22 NOTE — Patient Instructions (Addendum)
1.) Start taking Crestor 5 mg every daily. (Samples and prescription called in to [facility]). 2.) Call Lipid Clinic if you have any issues with starting Crestor. 3.) Continue to eat healthy, avoiding saturated fats and fried foods. 4.) Begin an exercise regimen by using stationary bike for 10-15 minutes 3-4 times a week. 5.) Return to follow-up in 1 month to recheck cholesterol.

## 2011-02-22 NOTE — Progress Notes (Signed)
Pleasant 55 y.o. AAF presents for initial Lipid Clinic Visit. Pt referred to clinic for elevated LDL and intolerances to simvastatin and pravastatin (headache). Pt currently on no cholesterol medications. Risk factors include CAD and DM. She has no complaints, including no chest pain or shortness of breath.   She adheres to a heart healthy diet. Eating times vary due to her schedule working 3rd shift at the [employer]. She does not eat beef or pork and limits fried food. Dietary review reveals: Breakfast: oatmeal, raisins, 2 pieces of toast, black coffee Lunch: [place] burgers, chicken, salads ([employer]) Dinner: vegetables and lean meats Snacks: fruit, granola bar, black coffee  She is currently not exercising, but has a stationary bike at home and intends to start an exercise regimen. Her self-reported goal would be 10-15 minutes daily.  Current Outpatient Prescriptions  Medication Sig Dispense Refill  . A/B OTIC otic solution PLACE 2 DROPS IN AFFECTEDEAR 3 TIMES A DAY AS NEEDED  15 mL  2  . ALPRAZolam (XANAX) 0.5 MG tablet Take 1 tablet (0.5 mg total) by mouth 3 (three) times daily as needed.  180 tablet  3  . aspirin 81 MG tablet Take 81 mg by mouth daily.        . calcium carbonate (OS-CAL) 600 MG TABS Take 600 mg by mouth daily.        . candesartan (ATACAND) 32 MG tablet Take 1 tablet (32 mg total) by mouth daily.  90 tablet  3  . furosemide (LASIX) 40 MG tablet Take 1 tablet (40 mg total) by mouth daily.  90 tablet  3  . HYDROcodone-homatropine (HYDROMET) 5-1.5 MG/5ML syrup 1 tsp every 6 hrs as needed for cough  120 mL  0  . hydrOXYzine (ATARAX) 25 MG tablet Take 25 mg by mouth every 4 (four) hours as needed.        . methocarbamol (ROBAXIN) 500 MG tablet Take 1 tablet (500 mg total) by mouth 3 (three) times daily.  90 tablet  3  . Multiple Vitamin (MULTIVITAMIN) capsule Take 1 capsule by mouth daily.        . ranitidine (ZANTAC) 75 MG tablet Take 1-2 tablets as needed for stomach acid        . traMADol (ULTRAM) 50 MG tablet Take 1 tablet (50 mg total) by mouth 3 (three) times daily.  90 tablet  5  . doxycycline (VIBRA-TABS) 100 MG tablet Take 1 tablet (100 mg total) by mouth 2 (two) times daily.  14 tablet  0  . DISCONTD: predniSONE, Pak, (STERAPRED) 5 MG TABS Take as directed  1 each  0

## 2011-02-22 NOTE — Assessment & Plan Note (Addendum)
Uncontrolled, currently on no medications. Lipid panel: TC 224, TG 43, HDL 86.3, LDL 130.3. Goal LDL <70 in the setting of CAD and DM. Encouraged continued compliance with heart healthy diet. Discussed medication options including statins and zetia. Pt willing to try rosuvastatin. Start rosuvastatin 5 mg daily. Pt instructed to call if any headaches or muscle pains/weakness. Instructed pt to start exercise regimen on stationary bike starting with 10-15 minutes 3x/week. Recheck lipid panel and return to clinic in 4 weeks.

## 2011-02-24 ENCOUNTER — Encounter: Payer: Self-pay | Admitting: Pulmonary Disease

## 2011-03-28 ENCOUNTER — Other Ambulatory Visit: Payer: Managed Care, Other (non HMO) | Admitting: *Deleted

## 2011-03-29 ENCOUNTER — Ambulatory Visit: Payer: Managed Care, Other (non HMO)

## 2011-04-04 ENCOUNTER — Other Ambulatory Visit (INDEPENDENT_AMBULATORY_CARE_PROVIDER_SITE_OTHER): Payer: PRIVATE HEALTH INSURANCE | Admitting: *Deleted

## 2011-04-04 ENCOUNTER — Other Ambulatory Visit: Payer: Self-pay | Admitting: Pulmonary Disease

## 2011-04-04 DIAGNOSIS — E785 Hyperlipidemia, unspecified: Secondary | ICD-10-CM

## 2011-04-04 LAB — HEPATIC FUNCTION PANEL
ALT: 12 U/L (ref 0–35)
AST: 16 U/L (ref 0–37)
Albumin: 4 g/dL (ref 3.5–5.2)
Alkaline Phosphatase: 79 U/L (ref 39–117)
Bilirubin, Direct: 0.1 mg/dL (ref 0.0–0.3)
Total Bilirubin: 0.6 mg/dL (ref 0.3–1.2)
Total Protein: 7.3 g/dL (ref 6.0–8.3)

## 2011-04-04 LAB — LDL CHOLESTEROL, DIRECT: Direct LDL: 120 mg/dL

## 2011-04-04 LAB — LIPID PANEL
Cholesterol: 222 mg/dL — ABNORMAL HIGH (ref 0–200)
HDL: 85.9 mg/dL (ref >39.00)
Total CHOL/HDL Ratio: 3
Triglycerides: 95 mg/dL (ref 0.0–149.0)
VLDL: 19 mg/dL (ref 0.0–40.0)

## 2011-04-05 ENCOUNTER — Ambulatory Visit (INDEPENDENT_AMBULATORY_CARE_PROVIDER_SITE_OTHER): Payer: PRIVATE HEALTH INSURANCE

## 2011-04-05 VITALS — BP 128/85 | Wt 216.0 lb

## 2011-04-05 DIAGNOSIS — E785 Hyperlipidemia, unspecified: Secondary | ICD-10-CM

## 2011-04-05 NOTE — Progress Notes (Signed)
Patient returns to lipid clinic for dyslipidemia follow up. She has had previous intolerances to simvastatin, pravastatin, and lipitor. Current regimen includes: crestor 5 mg daily. Review of dietary habits reveals patient is trying to follow a heart healthy diet. She rarely eats pork or beef. A typical breakfast includes: watermill, [place] bacon. Lunch-grilled chicken or baked. Dinner-chick fil a salad. She drinks water mostly, but will occasionally  Have a sweet tea. She is not exercising as suggested at last visit. Today she is complaining of muscle pain in her legs.

## 2011-04-05 NOTE — Patient Instructions (Addendum)
1.) Buy CoQ10 over the counter and see how that helps with the muscle aches 2.) Start taking niacin 500 mg. Make sure you take Aspirin or ibuprofen 30 minutes before to prevent flushing.  3.) increase your exercise to 3 x/week for 15 minutes twice daily on treadmill or bike 4.) Next appointment lab fasting-7/25 @ 8:30, lipid clinic- 7/30 at 3:30

## 2011-04-05 NOTE — Assessment & Plan Note (Signed)
TC 222, TG 95, HDL 85, LDL 120 (down from 130 at last visit) goal <70. Counseled patient on importance of diet and exercise. She has a treadmill at home and a stationary bike that she has agreed to start using 3x/week for 30 minutes total but broken into 15 minute intervals. For medication she would not try zetia because we did not have any samples. She declined an increase in dose of crestor due to muscle ache but agreed to stay on it and try CoQ10 to help with muscle aches. New regimen: crestor 5 mg daily, CoQ10 1 daily, niacin 500 mg (premedicating with aspirin or ibuprofen and instructions to take it before bedtime). Counseled patient she can go up on the niacin to 1000 mg if she can tolerate it. Will f/u in 6 weeks to assess muscle pains.

## 2011-04-09 ENCOUNTER — Telehealth: Payer: Self-pay | Admitting: Pulmonary Disease

## 2011-04-09 NOTE — Telephone Encounter (Signed)
Called, spoke with pt.  She was informed of tp's recs.  States she does not want to go back to the lipid clinic and only wants to "deal with Dr. Kriste Basque" because the lipid clinic wanted her to take crestor along with 2 other meds but had no samples of the meds.  She does not want to do this and states she will try to stop the crestor x 2 wks then take every other day.  States if this does not help, she will call back.

## 2011-04-09 NOTE — Telephone Encounter (Signed)
Spoke with pt.  She states has been taking crestor 5 mg x 6 wks and is having severe muscle aches in her legs. She has stopped taking med now and wants to know what else SN could recommend. Please advise, thanks! Allergies  Allergen Reactions  . Avelox (Moxifloxacin Hcl In Nacl) Anaphylaxis    Pt c/o swelling of her throat and tongue  . Codeine     REACTION: causes her heart to race  . Penicillins     REACTION: rash  . Simvastatin     REACTION: inotlerant to this med--made her unable to sleep and headache

## 2011-04-09 NOTE — Telephone Encounter (Signed)
Per TP--pt  Can try to stop  X 2 weeks then take every other day.  She has tried on several meds in the past and will need to discuss with lipid clinc so they can help her find an effective med. thanks

## 2011-05-08 NOTE — Progress Notes (Signed)
This encounter was created in error - please disregard.

## 2011-05-16 ENCOUNTER — Other Ambulatory Visit: Payer: PRIVATE HEALTH INSURANCE | Admitting: *Deleted

## 2011-05-21 ENCOUNTER — Ambulatory Visit: Payer: PRIVATE HEALTH INSURANCE

## 2011-06-06 ENCOUNTER — Telehealth: Payer: Self-pay | Admitting: Pulmonary Disease

## 2011-06-06 MED ORDER — HYDROCODONE-HOMATROPINE 5-1.5 MG/5ML PO SYRP: 5.0000 mL | ORAL_SOLUTION | Freq: Four times a day (QID) | ORAL | Status: AC | PRN

## 2011-06-06 MED ORDER — AZITHROMYCIN 250 MG PO TABS: ORAL_TABLET | ORAL | Status: AC

## 2011-06-06 NOTE — Telephone Encounter (Signed)
Called spoke with patient, advised of SN's recs.  Pt okay with this and verbalized her understanding.  Telephoned to verified pharmacy.

## 2011-06-06 NOTE — Telephone Encounter (Signed)
Per SN---zpak # 1 take as directed, hydromet  #6oz   1 tsp by mouth every 6 hours prn cough.  thanks

## 2011-06-06 NOTE — Telephone Encounter (Signed)
Pt c/o cough that started out productive but is now a dry hacky cough. She also c/o slight wheezing, sore throat and bilateral ear apin. She says she has been taking Delsym for the cough with little relief and her Hydromet has expired. She denies any fever or sob. She thinks she has just picked up a "bug" from visiting her father in nursing home. Would like something called to her pharmacy. Pls advise.

## 2011-07-31 ENCOUNTER — Telehealth: Payer: Self-pay | Admitting: Pulmonary Disease

## 2011-07-31 MED ORDER — HYDROCODONE-HOMATROPINE 5-1.5 MG/5ML PO SYRP: ORAL_SOLUTION | ORAL | Status: DC

## 2011-07-31 NOTE — Telephone Encounter (Signed)
Will be glad to see in office to evaluate  I have opening this am in [place]  Mucinex DM Twice daily  As needed  Cough/congestion  Fluids and rest  Please contact office for sooner follow up if symptoms do not improve or worsen or seek emergency care

## 2011-07-31 NOTE — Telephone Encounter (Signed)
hydromet #8 oz 1-2 tsp every 4-6 hr As needed  Cough, may make you sleepy, no refills  Will need ov if not improving.  Please contact office for sooner follow up if symptoms do not improve or worsen or seek emergency care

## 2011-07-31 NOTE — Telephone Encounter (Signed)
Pt aware of TP recs and nothing further was needed. Rx was called into [facility] [address]

## 2011-07-31 NOTE — Telephone Encounter (Signed)
I spoke with pt and she c/o a severe dry hacking cough, sore throat, both ears feels stopped up, nasal congestion x Saturday. Pt states she had some hydromet left over and has been taking that and it is helping her cough some but needs a refill bc she is almost out. Pt denies any fever, wheezing, chest tightness, nausea,vomiting. Please advise Tammy, Thanks  Allergies  Allergen Reactions  . Avelox (Moxifloxacin Hcl In Nacl) Anaphylaxis    Pt c/o swelling of her throat and tongue  . Codeine     REACTION: causes her heart to race  . Penicillins     REACTION: rash  . Simvastatin     REACTION: inotlerant to this med--made her unable to sleep and headache    Carver Fila, CMA

## 2011-07-31 NOTE — Telephone Encounter (Signed)
I spoke with pt and she states she can't come in and be seen today b/c her father is having surgery. I advised TP recs and she states can she still get a refill on her hydromet. Please advise Tammy, thanks  Carver Fila, CMA

## 2011-08-14 ENCOUNTER — Ambulatory Visit (INDEPENDENT_AMBULATORY_CARE_PROVIDER_SITE_OTHER): Payer: PRIVATE HEALTH INSURANCE | Admitting: Pulmonary Disease

## 2011-08-14 ENCOUNTER — Encounter: Payer: Self-pay | Admitting: Pulmonary Disease

## 2011-08-14 DIAGNOSIS — I872 Venous insufficiency (chronic) (peripheral): Secondary | ICD-10-CM

## 2011-08-14 DIAGNOSIS — R7309 Other abnormal glucose: Secondary | ICD-10-CM

## 2011-08-14 DIAGNOSIS — E785 Hyperlipidemia, unspecified: Secondary | ICD-10-CM

## 2011-08-14 DIAGNOSIS — I10 Essential (primary) hypertension: Secondary | ICD-10-CM

## 2011-08-14 DIAGNOSIS — F411 Generalized anxiety disorder: Secondary | ICD-10-CM

## 2011-08-14 MED ORDER — METHOCARBAMOL 500 MG PO TABS: 500.0000 mg | ORAL_TABLET | Freq: Three times a day (TID) | ORAL | Status: DC

## 2011-08-14 MED ORDER — CANDESARTAN CILEXETIL 32 MG PO TABS: 32.0000 mg | ORAL_TABLET | Freq: Every day | ORAL | Status: DC

## 2011-08-14 MED ORDER — TRAMADOL HCL 50 MG PO TABS: 50.0000 mg | ORAL_TABLET | Freq: Three times a day (TID) | ORAL | Status: DC

## 2011-08-14 MED ORDER — FUROSEMIDE 40 MG PO TABS: 40.0000 mg | ORAL_TABLET | Freq: Every day | ORAL | Status: DC

## 2011-08-14 MED ORDER — DIAZEPAM 5 MG PO TABS: ORAL_TABLET | ORAL | Status: DC

## 2011-08-14 NOTE — Patient Instructions (Signed)
Today we updated your med list in EPIC...    We decided to change the Xanax to VALIUM 5mg  - try 1/2 to 1 tab up to 3 times daily as needed for nerves...    We refilled your other meds per request...  Please return to our lab one morning this week for your follow up fasting blood work...    Please call the PHONE TREE in a few days for your results...    Dial [id number] & when prompted enter your patient number followed by the # symbol...    Your patient number is:  [id number]#  Let's get on track w/ our diet & exercise program...  Call for any questions...  Let's plan a follow up visit in 6 months, sooner if needed for problems.Marland KitchenMarland Kitchen

## 2011-08-16 ENCOUNTER — Other Ambulatory Visit (INDEPENDENT_AMBULATORY_CARE_PROVIDER_SITE_OTHER): Payer: PRIVATE HEALTH INSURANCE

## 2011-08-16 ENCOUNTER — Other Ambulatory Visit: Payer: Self-pay | Admitting: Pulmonary Disease

## 2011-08-16 DIAGNOSIS — R7309 Other abnormal glucose: Secondary | ICD-10-CM

## 2011-08-16 DIAGNOSIS — I10 Essential (primary) hypertension: Secondary | ICD-10-CM

## 2011-08-16 DIAGNOSIS — E785 Hyperlipidemia, unspecified: Secondary | ICD-10-CM

## 2011-08-16 DIAGNOSIS — I872 Venous insufficiency (chronic) (peripheral): Secondary | ICD-10-CM

## 2011-08-16 DIAGNOSIS — F411 Generalized anxiety disorder: Secondary | ICD-10-CM

## 2011-08-16 LAB — HEPATIC FUNCTION PANEL
ALT: 12 U/L (ref 0–35)
AST: 17 U/L (ref 0–37)
Albumin: 3.8 g/dL (ref 3.5–5.2)
Alkaline Phosphatase: 89 U/L (ref 39–117)
Bilirubin, Direct: 0 mg/dL (ref 0.0–0.3)
Total Bilirubin: 0.6 mg/dL (ref 0.3–1.2)
Total Protein: 7.4 g/dL (ref 6.0–8.3)

## 2011-08-16 LAB — CBC WITH DIFFERENTIAL/PLATELET
Basophils Absolute: 0 10*3/uL (ref 0.0–0.1)
Basophils Relative: 0.6 % (ref 0.0–3.0)
Eosinophils Absolute: 0.1 10*3/uL (ref 0.0–0.7)
Eosinophils Relative: 0.9 % (ref 0.0–5.0)
HCT: 36.8 % (ref 36.0–46.0)
Hemoglobin: 12.1 g/dL (ref 12.0–15.0)
Lymphocytes Relative: 39.8 % (ref 12.0–46.0)
Lymphs Abs: 3 10*3/uL (ref 0.7–4.0)
MCHC: 33 g/dL (ref 30.0–36.0)
MCV: 91.3 fl (ref 78.0–100.0)
Monocytes Absolute: 0.5 10*3/uL (ref 0.1–1.0)
Monocytes Relative: 6.9 % (ref 3.0–12.0)
Neutro Abs: 4 10*3/uL (ref 1.4–7.7)
Neutrophils Relative %: 51.8 % (ref 43.0–77.0)
Platelets: 338 10*3/uL (ref 150.0–400.0)
RBC: 4.03 Mil/uL (ref 3.87–5.11)
RDW: 13.9 % (ref 11.5–14.6)
WBC: 7.7 10*3/uL (ref 4.5–10.5)

## 2011-08-16 LAB — BASIC METABOLIC PANEL
BUN: 14 mg/dL (ref 6–23)
CO2: 29 mEq/L (ref 19–32)
Calcium: 9.2 mg/dL (ref 8.4–10.5)
Chloride: 108 mEq/L (ref 96–112)
Creatinine, Ser: 0.6 mg/dL (ref 0.4–1.2)
GFR: 123.12 mL/min (ref >60.00)
Glucose, Bld: 98 mg/dL (ref 70–99)
Potassium: 5 mEq/L (ref 3.5–5.1)
Sodium: 142 mEq/L (ref 135–145)

## 2011-08-16 LAB — LIPID PANEL
Cholesterol: 203 mg/dL — ABNORMAL HIGH (ref 0–200)
HDL: 79.4 mg/dL (ref >39.00)
Total CHOL/HDL Ratio: 3
Triglycerides: 55 mg/dL (ref 0.0–149.0)
VLDL: 11 mg/dL (ref 0.0–40.0)

## 2011-08-16 LAB — LDL CHOLESTEROL, DIRECT: Direct LDL: 122.4 mg/dL

## 2011-08-16 LAB — TSH: TSH: 0.78 u[IU]/mL (ref 0.35–5.50)

## 2011-08-16 LAB — HEMOGLOBIN A1C: Hgb A1c MFr Bld: 6.1 % (ref 4.6–6.5)

## 2011-08-26 ENCOUNTER — Encounter: Payer: Self-pay | Admitting: Pulmonary Disease

## 2011-08-26 NOTE — Progress Notes (Signed)
Subjective:    Patient ID: Susan [name], female    DOB: [birth year], 55 y.o.   MRN: [id number]  HPI  [name] here for a yearly follow up visit... she has mult medical problems including: HBP, Chol, CWP, HA's w/ pseudotumor cerebri, and anxiety...   ~ April 11, 2010:  she had GI eval from [facility] 1/11 w/ EGD showing benign gastric polyps, & subseq CT Chest (?extrinsic compression on esoph- neg CT- NAD, but coronary calcif seen)...  labs reviewed & we decided to f/u on next OV... she has done a fab job w/ diet & decr 17# to 223# today.  ~  August 15, 2010:   she has lost 5# more down to 218# today... saw Derm DrJordan recently w/ seb dermatitis & given Doryx, Olux-E foam, Zinc shampoo, & Clobetasol shampoo... BP controlled on meds;  trying to improve lipids w/ diet alone as she has been intol to all statine- offered low dose intermittent Crestor but she declines;  BS improved w/ wt reduction;  refills written today, she declines flu shot.  ~  February 13, 2011:  47mo ROV- c/o recent URI w/ cough, green sputum, head & chest congestion, sore throat, low grade temp, etc;  She was visiting relatives in [place], we called in ZPak & Hycodan but no better ==> Decided to check CXR (low lung vols, ectatic Ao, NAD) & Rx w/ Depo/ Dosepak, Avelox, Mucinex, etc...  Otherw stable> BP controlled on meds;  She now agrees to go to the Lipid clinic to help w/ her Chol & known coronary calcif;  BS remains OK on diet alone but wt loss has ceased & needs better diet/ exercise/ etc;  Requests refill meds... NOTE: ?Avelox caused throat swelling & she was changed to Doxy...  ~  August 14, 2011:  47mo ROV & she was started on Cres5 by the Lipid Clinic but she stopped already due to cramps she says;  She is under a lot of stress (Bro died in house fire, Father w/ bilat amput due to DM, gangrene) and wants something stronger than Xanax for nerves (we discussed switch to [facility];  She refuses the Flu vaccine & will ret for  fasting labs==> FLP fair w/ [id number], HYQ657, BS98, A1c6.1 >> rec to continue diet efforts...         Problem List:    HYPERTENSION (ICD-401.9) - controlled on ATACAND 32mg /d and FUROSEMIDE 40mg /d...  ~  10/11:  BP today= 126/84 & taking meds regularly/ tolerating well... checks BP occas at home and usually runs 120's/ 80's... denies HA, fatigue, visual changes, CP, palipit, dizziness, syncope, dyspnea, edema, etc... we discussed the importance of low sodium & weight reduction for her BP control. ~  4/12:  BP= 128/88 & asked to monitor BP at home more often... ~  10/12:  BP= 124/90 & wt is up to 225#; asked to continue same meds & get wt down!  Hx of CHEST PAIN UNSPECIFIED (ICD-786.50) - on ASA 81mg /d... prev hx CWP.Marland Kitchen.  ~  baseline EKG = NSR, WNL.Marland Kitchen. ~  CT Chest 1/11 showed incidental calcif in coronary art, otherw CT was neg... ~  CXR 4/12 showed low lung vol w/ crowding of markings, NAD...  VENOUS INSUFFICIENCY (ICD-459.81) - she has trace edema & knows to elim salt, elevate legs, wear support hose, & take her Lasix.  HYPERLIPIDEMIA (ICD-272.4) - on diet Rx alone; tried Simv40 in 2009 & Prav40 in 2010 but INTOL w/ HA & insomnia; referred to LipidClinic  in 2012 w/ Crestor5 started but she stopped that too due to cramps... ~  FLP 4/07 showed TChol 191, TG 70, HDL 61, LDL 116 ~  FLP 2/09 showed TChol 224, Tg 85, HDL 71, LDL 135 ~  FLP 12/09 showed TChol 207, TG 73, HDL 58, LDL 138 ~  FLP 4/10 showed Tchol 222, TG 92, HDL 70, LDL 135 ~  FLP 1/11 showed TChol 206, TG 77, HDL 66, LDL 130... not at goal, declines LipidClinic. ~  FLP 10/11 showed TChol 228, TG 64, HDL 76, LDL 134... not at goal, declines low dose intermit Crestor. ~  4/12:  We reviewed her prev results & the need for meds> she agrees to Lipid Clinic referral... ~  FLP 10/12 on diet alone showed TChol 203, TG 55, HDL 79, LDL 122... She stopped cres5 due to musc cramps.  DIABETES MELLITUS, BORDERLINE (ICD-790.29) - FBS 90-115 over  the last few yrs... diet Rx. ~  labs 2/09 showed BS= 110, HgA1c= 6.2.Marland Kitchen. rec> diet Rx, get wt down. ~  labs 12/09 showed BS= 102, HgA1c= 6.2 ~  labs 4/10 showed BS= 110, A1c= 6.4 ~  labs 1/11 showed BS= 103... keep up the godd work. ~  labs 10/11 showed BS= 79, A1c= 6.0 ~  Labs 10/12 on diet alone showed BS= 98, A1c= 6.1  MORBID OBESITY (ICD-278.01) - we have discussed Bariatric Surg and she has several relatives who have had surg... "I'm afraid"- pt encouraged to seek counsel at [facility]. ~  weight 2/09 = 247#... 5'1" tall... BMI=47...  ~  weight 3/10 = 256#... we reviewed diet + exercise therapy... ~  weight 1/11 = 240# ~  weight 6/11 = 223#... great job!!! ~  Weight 10/11 = 217# ~  Weight 4/12 = 219# ~  Weight 10/12 = 225#  OTHER DYSPHAGIA (ICD-787.29) - c/o upper esoph dysphagia w/ discomfort... no response to Zantac, but sl better w/ Alpraz... referred to GI, DrPatterson 1/11 w/ EGD showing mult benign gastric polyps, ? extrinsic compression> subseq CT Chest was neg x for coronary calcif seen... globus symptoms improved on HYDROXYZINE 25mg  Prn & she continues on ZANTAC vs PRILOSEC...  IRRITABLE BOWEL SYNDROME (ICD-564.1) - last colon 5/06 by DrPatterson... normal exam.  SPONDYLOSIS, LUMBAR (ICD-721.3) - she had MRI 1/08 by DrKramer showing bulging discs and some facet arthropathy... takes ROBAXIN 500mg  Tid Prn, and TRAMADOL 50mg  Tid Prn...  Hx of HEADACHE (ICD-784.0) - eval in the [facility] in 2003... she uses Ibuprofen Prn...  Hx of PSEUDOTUMOR CEREBRI (ICD-348.2) - prev followed by Deland Pretty and DrMartin at [place], plus DrLove in [place]... ~  Mar10: pt had eye check recently at [facility] and was told OK...  ANXIETY (ICD-300.00) - has used XANAX 0.5mg  as needed in past...  ALOPECIA (ICD-704.00) & Seborrheic Dermatitis treated by DrJordan for Derm...   No past surgical history on file.   Outpatient Encounter Prescriptions as of 08/14/2011  Medication Sig  Dispense Refill  . A/B OTIC otic solution PLACE 2 DROPS IN AFFECTEDEAR 3 TIMES A DAY AS NEEDED  15 mL  2  . aspirin 81 MG tablet Take 81 mg by mouth daily.        . calcium carbonate (OS-CAL) 600 MG TABS Take 600 mg by mouth daily.        . candesartan (ATACAND) 32 MG tablet Take 1 tablet (32 mg total) by mouth daily.  90 tablet  3  . furosemide (LASIX) 40 MG tablet Take 1 tablet (  40 mg total) by mouth daily.  90 tablet  3  . HYDROcodone-homatropine (HYDROMET) 5-1.5 MG/5ML syrup 1 tsp every 4-6 hrs as needed for cough  240 mL  0  . methocarbamol (ROBAXIN) 500 MG tablet Take 1 tablet (500 mg total) by mouth 3 (three) times daily.  90 tablet  3  . Multiple Vitamin (MULTIVITAMIN) capsule Take 1 capsule by mouth daily.        . ranitidine (ZANTAC) 75 MG tablet Take 1-2 tablets as needed for stomach acid       . traMADol (ULTRAM) 50 MG tablet Take 1 tablet (50 mg total) by mouth 3 (three) times daily.  90 tablet  5  . diazepam (VALIUM) 5 MG tablet Take 1/2 to 1 tablet by mouth three times daily as needed for nerves  90 tablet  5  . DISCONTD: rosuvastatin (CRESTOR) 5 MG tablet Take 1 tablet (5 mg total) by mouth at bedtime.  30 tablet  11    Allergies  Allergen Reactions  . Avelox (Moxifloxacin Hcl In Nacl) Anaphylaxis    Pt c/o swelling of her throat and tongue  . Codeine     REACTION: causes her heart to race  . Crestor (Rosuvastatin Calcium)     Pt states INTOLERANT w/ muscle cramps  . Penicillins     REACTION: rash  . Pravastatin Sodium (Pravachol)     REACTION: intolerant= headache & insomnia  . Shellfish Allergy     itching  . Simvastatin     REACTION: intolerant= headache & insomnia    Current Medications, Allergies, Past Medical History, Past Surgical History, Family History, and Social History were reviewed in [employer] record.    Review of Systems         See HPI - all other systems neg except as noted... The patient complains of dyspnea on  exertion.  The patient denies anorexia, fever, weight loss, weight gain, vision loss, decreased hearing, hoarseness, chest pain, syncope, peripheral edema, prolonged cough, headaches, hemoptysis, abdominal pain, melena, hematochezia, severe indigestion/heartburn, hematuria, incontinence, muscle weakness, suspicious skin lesions, transient blindness, difficulty walking, depression, unusual weight change, abnormal bleeding, enlarged lymph nodes, and angioedema.     Objective:   Physical Exam     WD, Obese, [name] in NAD... GENERAL:  Alert & oriented; pleasant & cooperative. HEENT:  [place]/AT, EOM-wnl, PERRLA, EACs-clear, TMs-wnl, NOSE-clear, THROAT-clear & wnl. NECK:  Supple w/ fairROM; no JVD; normal carotid impulses w/o bruits; no thyromegaly or nodules palpated; no lymphadenopathy. CHEST:  Clear to P & A; without wheezes/ rales/ or rhonchi. HEART:  Regular Rhythm; without murmurs/ rubs/ or gallops. ABDOMEN:  Obese, soft & nontender; + panniculus; normal bowel sounds; no organomegaly or masses detected. EXT:  mild arthritic deformities bilat knees; no varicose veins/ +venous insuffic/ tr edema. NEURO:  CN's intact;  no focal neuro deficits... DERM:  No lesions noted; no rash etc...   Assessment & Plan:   HBP>  Fair control on her ARB/ Diuretic; needs better job w/ wt reduction, diet, etc & we discussed this...  HYPERLIPID>  She has been followed in the Lipid clinic but was apparently unable to tol even 5mg  Crestor, and had to stop; on diet alone & f/u FLP looks somewhat better, continue diet efforts & get wt down...  DM, Borderline>  This has been well controlled on diet alone, min DM, but at risk for worsening glucose control & needs to lose the wt...  GI>  Hx dyspagia, IBS,  etc...globus symptoms improved w/ prev Hydroxyzine, neg colon in 2006; she denies current problems...  LBP>  Stable on Tramadol & Robaxin... Needs to lose the weight...  Anxiety>  Under a lot of stress as noted &  she wants a stronger med; try changing Alpraz to VALIUM 5mg  Tid as needed.Marland KitchenMarland Kitchen

## 2011-09-20 ENCOUNTER — Telehealth: Payer: Self-pay | Admitting: Pulmonary Disease

## 2011-09-20 MED ORDER — AZITHROMYCIN 250 MG PO TABS: ORAL_TABLET | ORAL | Status: AC

## 2011-09-20 NOTE — Telephone Encounter (Signed)
lmomtcb for pt---per TP---ok for zpak #1  Take as directed, use mucinex, saline nasal spray and claritin---if not better will need ov.

## 2011-09-20 NOTE — Telephone Encounter (Signed)
Spoke with pt. She is c/o sore throat, ear pain, and prod cough with minimal yellow sputum. She states that she picked up "bug" when traveled to [place] 1 wk ago. Her symptoms started 5 days ago. Would like something called in. Please advise, thanks! Allergies  Allergen Reactions  . Avelox (Moxifloxacin Hcl In Nacl) Anaphylaxis    Pt c/o swelling of her throat and tongue  . Codeine     REACTION: causes her heart to race  . Crestor (Rosuvastatin Calcium)     Pt states INTOLERANT w/ muscle cramps  . Penicillins     REACTION: rash  . Pravastatin Sodium (Pravachol)     REACTION: intolerant= headache & insomnia  . Shellfish Allergy     itching  . Simvastatin     REACTION: intolerant= headache & insomnia

## 2011-09-20 NOTE — Telephone Encounter (Signed)
The pt is aware of TP recs and will call for an appt if her sxs do not improve or seek emergency help if needed. Zpak rx sent to [facility] on [address] per patient request.

## 2011-09-20 NOTE — Telephone Encounter (Signed)
lmomtcb  

## 2011-09-20 NOTE — Telephone Encounter (Signed)
PT RETURNED CALL

## 2011-10-29 ENCOUNTER — Other Ambulatory Visit: Payer: Self-pay | Admitting: Pulmonary Disease

## 2011-10-30 ENCOUNTER — Encounter: Payer: Self-pay | Admitting: Pulmonary Disease

## 2011-12-03 ENCOUNTER — Telehealth: Payer: Self-pay | Admitting: Pulmonary Disease

## 2011-12-03 MED ORDER — METRONIDAZOLE 500 MG PO TABS: 500.0000 mg | ORAL_TABLET | Freq: Three times a day (TID) | ORAL | Status: AC

## 2011-12-03 NOTE — Telephone Encounter (Signed)
Spoke with pt. She states that she has been having abdominal pain since 2/6, had diarrhea for 3 days but this has resolved. She states that she is feeling nauseated, but denied any vomiting, fever or other symptoms. She states wants appt with SN. I advised that he has no openings and again offered ov with TP and she declined this. Only wants to see SN. I advised will send him msg for recs, and in the meantime seek emergency care if needed. Please advise, thanks! Allergies  Allergen Reactions  . Avelox (Moxifloxacin Hcl In Nacl) Anaphylaxis    Pt c/o swelling of her throat and tongue  . Codeine     REACTION: causes her heart to race  . Crestor (Rosuvastatin Calcium)     Pt states INTOLERANT w/ muscle cramps  . Penicillins     REACTION: rash  . Pravastatin Sodium (Pravachol)     REACTION: intolerant= headache & insomnia  . Shellfish Allergy     itching  . Simvastatin     REACTION: intolerant= headache & insomnia

## 2011-12-03 NOTE — Telephone Encounter (Signed)
Called and spoke with pt and per SN----clear liquid diet, flagyl 500mg    #15  1 po tid , take align dialy, activia yogurt daily.  Pt is aware that per SN to try this first and call us back if this is not working for her.  Pt voiced her understanding of this.  She is doing a clear liquid diet at this time.

## 2011-12-27 ENCOUNTER — Telehealth: Payer: Self-pay | Admitting: Pulmonary Disease

## 2011-12-27 MED ORDER — ALPRAZOLAM 0.5 MG PO TABS: ORAL_TABLET | ORAL | Status: DC

## 2011-12-27 NOTE — Telephone Encounter (Signed)
Called and spoke with [facility] and they stated that they have never filled the diazepam for the pt.  The last time she had alprazolam filled there was back in 2007.  SN please advise if ok to fill the alprazolam for the pt.  thanks

## 2011-12-27 NOTE — Telephone Encounter (Signed)
Pt says that when she was here in Oct 2012 she was given a prescription for Xanax but she never got this filled. This medication is not listed as a current medication on pt's list. I did question if she is taking the Diazepam and the pt denied taking. She says she has been using the Xanax. Pt is scheduled for follow-up on 02/13/12 with SN. Pls advise. Allergies  Allergen Reactions  . Avelox (Moxifloxacin Hcl In Nacl) Anaphylaxis    Pt c/o swelling of her throat and tongue  . Codeine     REACTION: causes her heart to race  . Crestor (Rosuvastatin Calcium)     Pt states INTOLERANT w/ muscle cramps  . Penicillins     REACTION: rash  . Pravastatin Sodium (Pravachol)     REACTION: intolerant= headache & insomnia  . Shellfish Allergy     itching  . Simvastatin     REACTION: intolerant= headache & insomnia

## 2011-12-27 NOTE — Telephone Encounter (Signed)
lmom to make pt aware that meds have been called to her pharmacy

## 2012-01-07 ENCOUNTER — Ambulatory Visit (INDEPENDENT_AMBULATORY_CARE_PROVIDER_SITE_OTHER): Payer: PRIVATE HEALTH INSURANCE | Admitting: Pulmonary Disease

## 2012-01-07 ENCOUNTER — Encounter: Payer: Self-pay | Admitting: Pulmonary Disease

## 2012-01-07 VITALS — BP 140/88 | HR 66 | Temp 98.1°F | Ht 61.0 in | Wt 225.8 lb

## 2012-01-07 DIAGNOSIS — M47817 Spondylosis without myelopathy or radiculopathy, lumbosacral region: Secondary | ICD-10-CM

## 2012-01-07 DIAGNOSIS — K589 Irritable bowel syndrome without diarrhea: Secondary | ICD-10-CM

## 2012-01-07 DIAGNOSIS — E785 Hyperlipidemia, unspecified: Secondary | ICD-10-CM

## 2012-01-07 DIAGNOSIS — F411 Generalized anxiety disorder: Secondary | ICD-10-CM

## 2012-01-07 DIAGNOSIS — I872 Venous insufficiency (chronic) (peripheral): Secondary | ICD-10-CM

## 2012-01-07 DIAGNOSIS — R7309 Other abnormal glucose: Secondary | ICD-10-CM

## 2012-01-07 DIAGNOSIS — I10 Essential (primary) hypertension: Secondary | ICD-10-CM

## 2012-01-07 DIAGNOSIS — G932 Benign intracranial hypertension: Secondary | ICD-10-CM

## 2012-01-07 MED ORDER — DICYCLOMINE HCL 20 MG PO TABS: 20.0000 mg | ORAL_TABLET | Freq: Three times a day (TID) | ORAL | Status: DC | PRN

## 2012-01-07 NOTE — Progress Notes (Addendum)
Subjective:    Patient ID: Susan [name], female    DOB: 09-01-1956, 56 y.o.   MRN: [id number]  HPI 56 y/o BF here for a yearly follow up visit... she has mult medical problems including: HBP, Chol, CWP, HA's w/ pseudotumor cerebri, and anxiety...   ~ April 11, 2010:  she had GI eval from [facility] 1/11 w/ EGD showing benign gastric polyps, & subseq CT Chest (?extrinsic compression on esoph- neg CT- NAD, but coronary calcif seen)...  labs reviewed & we decided to f/u on next OV... she has done a fab job w/ diet & decr 17# to 223# today.  ~  August 15, 2010:   she has lost 5# more down to 218# today... saw Derm DrJordan recently w/ seb dermatitis & given Doryx, Olux-E foam, Zinc shampoo, & Clobetasol shampoo... BP controlled on meds;  trying to improve lipids w/ diet alone as she has been intol to all statine- offered low dose intermittent Crestor but she declines;  BS improved w/ wt reduction;  refills written today, she declines flu shot.  ~  February 13, 2011:  14mo ROV- c/o recent URI w/ cough, green sputum, head & chest congestion, sore throat, low grade temp, etc;  She was visiting relatives in [place], we called in ZPak & Hycodan but no better ==> Decided to check CXR (low lung vols, ectatic Ao, NAD) & Rx w/ Depo/ Dosepak, Avelox, Mucinex, etc...  Otherw stable> BP controlled on meds;  She now agrees to go to the Lipid clinic to help w/ her Chol & known coronary calcif;  BS remains OK on diet alone but wt loss has ceased & needs better diet/ exercise/ etc;  Requests refill meds... NOTE: ?Avelox caused throat swelling & she was changed to Doxy...  ~  August 14, 2011:  14mo ROV & she was started on Cres5 by the Lipid Clinic but she stopped already due to cramps she says;  She is under a lot of stress (Bro died in house fire, Father w/ bilat amput due to DM, gangrene) and wants something stronger than Xanax for nerves (we discussed switch to [facility];  She refuses the Flu vaccine & will ret for fasting  labs==> FLP fair w/ [id number], LKG401, BS98, A1c6.1 >> rec to continue diet efforts...  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  We discussed Rx w/ Metamucil for the diarrhea & stool normalization, and BENTYL 20mg  Tid as needed for the abd cramping pain... See prob list below>>         Problem List:    PROBLEM LIST UPDATED 01/07/12 >>  HYPERTENSION (ICD-401.9) - controlled on ATACAND 32mg /d and FUROSEMIDE 40mg /d...  ~  10/11:  BP= 126/84 & taking meds regularly/ tolerating well> checks BP occas at home and usually runs 120's/ 80's; denies HA, fatigue, visual changes, CP, palipit, dizziness, syncope,  dyspnea, edema, etc... ~  4/12:  BP= 128/88 & asked to monitor BP at home more often; we discussed the importance of low sodium & weight reduction for her BP control. ~  10/12:  BP= 124/90 & wt is up to 225#; asked to continue same meds & get wt down! ~  3/13:  BP= 140/80 & she has mult minor somatic complaints...  Hx of CHEST PAIN UNSPECIFIED (ICD-786.50) - on ASA 81mg /d... prev hx CWP; asked to diet, exercise, get wt down. ~  baseline EKG = NSR, WNL.Marland Kitchen. ~  CT Chest 1/11 showed incidental calcif in coronary art, otherw CT was neg... ~  CXR 4/12 showed low lung vol w/ crowding of markings, NAD...  VENOUS INSUFFICIENCY (ICD-459.81) - she has trace edema & knows to elim salt, elevate legs, wear support hose, & take her  Lasix.  HYPERLIPIDEMIA (ICD-272.4) - on diet Rx alone; tried Simv40 in 2009 & Prav40 in 2010 but INTOL w/ HA & insomnia; referred to [facility]id[facility]inic in 2012 w/ Crestor5 started but she stopped that too due to cramps... ~  FLP 4/07 showed TChol 191, TG 70, HDL 61, LDL 116 ~  FLP 2/09 showed TChol 224, Tg 85, HDL 71, LDL 135 ~  FLP 12/09 showed TChol 207, TG 73, HDL 58, LDL 138 ~  FLP 4/10 showed Tchol 222, TG 92, HDL 70, LDL 135 ~  FLP 1/11 showed TChol 206, TG 77, HDL 66, LDL 130... not at goal, declines LipidClinic. ~  FLP 10/11 showed TChol 228, TG 64, HDL 76, LDL 134... not at goal, declines low dose intermit Crestor. ~  4/12:  We reviewed her prev results & the need for meds> she agrees to Lipid Clinic referral... ~  2012:  Lipid Clinic tried Crestor 5mg  but she stopped on her own due to muscle cramps & didn't ret to [facility]... ~  FLP 10/12 on diet alone showed TChol 203, TG 55, HDL 79, LDL 122... She refuses meds & refuses LC, will "do diet alone"  DIABETES MELLITUS, BORDERLINE (ICD-790.29) - on diet Rx alone but has been unable to lose wt, not exercising, etc... ~  labs 2/09 showed BS= 110, HgA1c= 6.2.Marland Kitchen. rec> diet Rx, get wt down. ~  labs 12/09 showed BS= 102, HgA1c= 6.2 ~  labs 4/10 showed BS= 110, A1c= 6.4 ~  labs 1/11 showed BS= 103... keep up the good work. ~  labs 10/11 showed BS= 79, A1c= 6.0 ~  Labs 10/12 on diet alone showed BS= 98, A1c= 6.1  MORBID OBESITY (ICD-278.01) - we have discussed Bariatric Surg and she has several relatives who have had surg> "I'm afraid"- pt encouraged to seek counsel at [facility]. ~  weight 2/09 = 247#... 5'1" tall... BMI=47...  ~  weight 3/10 = 256#... we reviewed diet + exercise therapy... ~  weight 1/11 = 240# ~  weight 6/11 = 223#... great job!!! ~  Weight 10/11 = 217# ~  Weight 4/12 = 219# ~  Weight 10/12 = 225# ~  Weight 3/13 = 226#  OTHER DYSPHAGIA (ICD-787.29) - prev hx upper esoph dysphagia w/ discomfort... no response to Zantac,  but sl better w/ Alpraz... referred to GI, DrPatterson 1/11 w/ EGD showing mult benign gastric polyps, ?extrinsic compression> subseq CT Chest was neg x for coronary calcif seen... globus symptoms improved on HYDROXYZINE 25mg  Prn & she continues on ZANTAC vs PRILOSEC...  IRRITABLE BOWEL SYNDROME (ICD-564.1) - last colon 5/06 by DrPatterson... normal exam.  SPONDYLOSIS, LUMBAR (ICD-721.3) - she  had MRI 1/08 by DrKramer showing bulging discs and some facet arthropathy... takes ROBAXIN 500mg  Tid Prn, and TRAMADOL 50mg  Tid Prn...  Hx of HEADACHE (ICD-784.0) - eval in the [facility] in 2003... she uses Ibuprofen Prn...  Hx of PSEUDOTUMOR CEREBRI (ICD-348.2) - prev followed by Deland Pretty and DrMartin at [place], plus DrLove in [place]... ~  Mar10: pt had eye check recently at [facility] and was told OK...  ANXIETY (ICD-300.00) - has used XANAX 0.5mg  as needed;  Also had Valium on her list but prefers the [employer] Rx.  ALOPECIA (ICD-704.00) & Seborrheic Dermatitis treated by DrJordan for Derm...   No past surgical history on file.   Outpatient Encounter Prescriptions as of 01/07/2012  Medication Sig Dispense Refill  . ALPRAZolam (XANAX) 0.5 MG tablet 1/2 - 1 tablet by mouth three times daily as needed for nerves  90 tablet  5  . aspirin 81 MG tablet Take 81 mg by mouth daily.        . calcium carbonate (OS-CAL) 600 MG TABS Take 600 mg by mouth daily.        . candesartan (ATACAND) 32 MG tablet Take 1 tablet (32 mg total) by mouth daily.  90 tablet  3  . diazepam (VALIUM) 5 MG tablet Take 1/2 to 1 tablet by mouth three times daily as needed for nerves  90 tablet  5  . furosemide (LASIX) 40 MG tablet Take 1 tablet (40 mg total) by mouth daily.  90 tablet  3  . HYDROcodone-homatropine (HYDROMET) 5-1.5 MG/5ML syrup 1 tsp every 4-6 hrs as needed for cough  240 mL  0  . methocarbamol (ROBAXIN) 500 MG tablet TAKE 1 TABLET BY MOUTH 4 TIMES DAILY AS NEEDED FORMUSCLE SPASMS  100 tablet  6  . Multiple Vitamin  (MULTIVITAMIN) capsule Take 1 capsule by mouth daily.        . ranitidine (ZANTAC) 75 MG tablet Take 1-2 tablets as needed for stomach acid       . traMADol (ULTRAM) 50 MG tablet TAKE 1 TABLET BY MOUTH 4 TIMES DAILY AS NEEDED FORPAIN  100 tablet  6  . A/B OTIC otic solution PLACE 2 DROPS IN AFFECTEDEAR 3 TIMES A DAY AS NEEDED  15 mL  2    Allergies  Allergen Reactions  . Avelox (Moxifloxacin Hcl In Nacl) Anaphylaxis    Pt c/o swelling of her throat and tongue  . Codeine     REACTION: causes her heart to race  . Crestor (Rosuvastatin Calcium)     Pt states INTOLERANT w/ muscle cramps  . Penicillins     REACTION: rash  . Pravastatin Sodium (Pravachol)     REACTION: intolerant= headache & insomnia  . Shellfish Allergy     Rash and itching  . Simvastatin     REACTION: intolerant= headache & insomnia    Current Medications, Allergies, Past Medical History, Past Surgical History, Family History, and Social History were reviewed in [employer] record.    Review of Systems         See HPI - all other systems neg except as noted... The patient complains of dyspnea on exertion.  The patient denies anorexia, fever, weight loss, weight gain, vision loss, decreased hearing, hoarseness, chest pain, syncope, peripheral edema, prolonged cough, headaches, hemoptysis, abdominal pain, melena, hematochezia, severe indigestion/heartburn, hematuria, incontinence, muscle weakness, suspicious skin lesions, transient blindness, difficulty walking, depression, unusual weight change, abnormal bleeding, enlarged lymph nodes, and angioedema.     Objective:  Physical Exam     WD, Obese, 56 y/o BF in NAD... GENERAL:  Alert & oriented; pleasant & cooperative. HEENT:  [place]/AT, EOM-wnl, PERRLA, EACs-clear, TMs-wnl, NOSE-clear, THROAT-clear & wnl. NECK:  Supple w/ fairROM; no JVD; normal carotid impulses w/o bruits; no thyromegaly or nodules palpated; no lymphadenopathy. CHEST:  Clear to P  & A; without wheezes/ rales/ or rhonchi. HEART:  Regular Rhythm; without murmurs/ rubs/ or gallops. ABDOMEN:  Obese, soft & nontender; + panniculus; normal bowel sounds; no organomegaly or masses detected. EXT:  mild arthritic deformities bilat knees; no varicose veins/ +venous insuffic/ tr edema. NEURO:  CN's intact;  no focal neuro deficits... DERM:  No lesions noted; no rash etc...  RADIOLOGY DATA:  Reviewed in the EPIC EMR & discussed w/ the patient...  LABORATORY DATA:  Reviewed in the EPIC EMR & discussed w/ the patient...   Assessment & Plan:   IBS>  She is c/o IBS symptoms but they seem rather mild & we discussed Rx w/ Metamucil daily as stool normalizer & BENTYL 20mg  Tid as needed for cramping; if symptoms worsen then she will f/u w/ GI, DrPatterson; she is under a lot of stress & this can exac the IBS, she has Alpraz for Prn use...  Anxiety>  Under a lot of stress as noted & she prefers the Alpraz over the Valium; wants FMLA form filled out for work stress but doesn't have the form...   HBP>  Fair control on her ARB/ Diuretic; needs better job w/ wt reduction, diet, etc & we discussed this...  HYPERLIPID>  She has been followed in the Lipid Clinic but was apparently unable to tol even 5mg  Crestor, and had to stop; on diet alone & f/u FLP looks somewhat better, continue diet efforts & get wt down...  DM, Borderline>  This has been well controlled on diet alone, min DM, but at risk for worsening glucose control & needs to lose the wt...  GI>  Hx dyspagia, IBS, etc...globus symptoms improved w/ prev Hydroxyzine, neg colon in 2006; she has mult somatic complaints...  LBP>  Stable on Tramadol & Robaxin... Needs to lose the weight.Marland KitchenMarland Kitchen

## 2012-01-07 NOTE — Patient Instructions (Signed)
Today we updated your med list in our EPIC system...    Continue your current medications the same...  For the Abdominal symptoms:    I rec that you start METAMUCIL- 1 tablesp in water daily...    And try the new Rx for BENTYL (Dicyclomine) one tab up to 3 times daily as needed for abd cramping...  Call for any questions...  Let's plan a follow up appt in about 106mo w/ FASTING labs around that time.Marland KitchenMarland Kitchen

## 2012-02-04 ENCOUNTER — Telehealth: Payer: Self-pay | Admitting: Pulmonary Disease

## 2012-02-04 NOTE — Telephone Encounter (Signed)
Set appt w/ pt for 02/05/12 @ 12:00 pm.  Pt stated nothing further needed at this time.  Antionette Fairy

## 2012-02-04 NOTE — Telephone Encounter (Signed)
Per SN--pt will need to come in for ov with SN to complete these forms.  Ok to add pt for 4-16 at 23.  thanks

## 2012-02-04 NOTE — Telephone Encounter (Signed)
lmomtcb x1 

## 2012-02-05 ENCOUNTER — Encounter: Payer: Self-pay | Admitting: Pulmonary Disease

## 2012-02-05 ENCOUNTER — Ambulatory Visit (INDEPENDENT_AMBULATORY_CARE_PROVIDER_SITE_OTHER): Payer: PRIVATE HEALTH INSURANCE | Admitting: Pulmonary Disease

## 2012-02-05 DIAGNOSIS — Z0289 Encounter for other administrative examinations: Secondary | ICD-10-CM

## 2012-02-05 DIAGNOSIS — E785 Hyperlipidemia, unspecified: Secondary | ICD-10-CM

## 2012-02-05 DIAGNOSIS — I872 Venous insufficiency (chronic) (peripheral): Secondary | ICD-10-CM

## 2012-02-05 DIAGNOSIS — I10 Essential (primary) hypertension: Secondary | ICD-10-CM

## 2012-02-05 DIAGNOSIS — F411 Generalized anxiety disorder: Secondary | ICD-10-CM

## 2012-02-05 DIAGNOSIS — R1319 Other dysphagia: Secondary | ICD-10-CM

## 2012-02-05 DIAGNOSIS — R7309 Other abnormal glucose: Secondary | ICD-10-CM

## 2012-02-05 DIAGNOSIS — K589 Irritable bowel syndrome without diarrhea: Secondary | ICD-10-CM

## 2012-02-05 NOTE — Progress Notes (Deleted)
Subjective:    Patient ID: Susan [name], female    DOB: 1955-11-08, 56 y.o.   MRN: [id number]  HPI 56 y/o BF here for a follow up visit... she has mult medical problems including: HBP, Chol, CWP, HA's w/ pseudotumor cerebri, and anxiety...   ~ April 11, 2010:  she had GI eval from [facility] 1/11 w/ EGD showing benign gastric polyps, & subseq CT Chest (?extrinsic compression on esoph- neg CT- NAD, but coronary calcif seen)...  labs reviewed & we decided to f/u on next OV... she has done a fab job w/ diet & decr 17# to 223# today.  ~  August 15, 2010:   she has lost 5# more down to 218# today... saw Derm DrJordan recently w/ seb dermatitis & given Doryx, Olux-E foam, Zinc shampoo, & Clobetasol shampoo... BP controlled on meds;  trying to improve lipids w/ diet alone as she has been intol to all statine- offered low dose intermittent Crestor but she declines;  BS improved w/ wt reduction;  refills written today, she declines flu shot.  ~  February 13, 2011:  762mo ROV- c/o recent URI w/ cough, green sputum, head & chest congestion, sore throat, low grade temp, etc;  She was visiting relatives in [place], we called in ZPak & Hycodan but no better ==> Decided to check CXR (low lung vols, ectatic Ao, NAD) & Rx w/ Depo/ Dosepak, Avelox, Mucinex, etc...  Otherw stable> BP controlled on meds;  She now agrees to go to the Lipid clinic to help w/ her Chol & known coronary calcif;  BS remains OK on diet alone but wt loss has ceased & needs better diet/ exercise/ etc;  Requests refill meds... NOTE: ?Avelox caused throat swelling & she was changed to Doxy...  ~  August 14, 2011:  762mo ROV & she was started on Cres5 by the Lipid Clinic but she stopped already due to cramps she says;  She is under a lot of stress (Bro died in house fire, Father w/ bilat amput due to DM, gangrene) and wants something stronger than Xanax for nerves (we discussed switch to [facility];  She refuses the Flu vaccine & will ret for fasting  labs==> FLP fair w/ [id number], [id number], BS98, A1c6.1 >> rec to continue diet efforts...  ~  January 07, 2012:  62mo ROV          Problem List:    HYPERTENSION (ICD-401.9) - controlled on ATACAND 32mg /d and FUROSEMIDE 40mg /d...  ~  10/11:  BP today= 126/84 & taking meds regularly/ tolerating well... checks BP occas at home and usually runs 120's/ 80's... denies HA, fatigue, visual changes, CP, palipit, dizziness, syncope, dyspnea, edema, etc... we discussed the importance of low sodium & weight reduction for her BP control. ~  4/12:  BP= 128/88 & asked to monitor BP at home more often... ~  10/12:  BP= 124/90 & wt is up to 225#; asked to continue same meds & get wt down!  Hx of CHEST PAIN UNSPECIFIED (ICD-786.50) - on ASA 81mg /d... prev hx CWP.Marland Kitchen.  ~  baseline EKG = NSR, WNL.Marland Kitchen. ~  CT Chest 1/11 showed incidental calcif in coronary art, otherw CT was neg... ~  CXR 4/12 showed low lung vol w/ crowding of markings, NAD...  VENOUS INSUFFICIENCY (ICD-459.81) - she has trace edema & knows to elim salt, elevate legs, wear support hose, & take her Lasix.  HYPERLIPIDEMIA (ICD-272.4) - on diet Rx alone; tried Simv40 in 2009 & Prav40 in 2010 but  INTOL w/ HA & insomnia; referred to [facility] in 2012 w/ Crestor5 started but she stopped that too due to cramps... ~  FLP 4/07 showed TChol 191, TG 70, HDL 61, LDL 116 ~  FLP 2/09 showed TChol 224, Tg 85, HDL 71, LDL 135 ~  FLP 12/09 showed TChol 207, TG 73, HDL 58, LDL 138 ~  FLP 4/10 showed Tchol 222, TG 92, HDL 70, LDL 135 ~  FLP 1/11 showed TChol 206, TG 77, HDL 66, LDL 130... not at goal, declines [facility]inic. ~  FLP 10/11 showed TChol 228, TG 64, HDL 76, LDL 134... not at goal, declines low dose intermit Crestor. ~  4/12:  We reviewed her prev results & the need for meds> she agrees to Lipid Clinic referral... ~  FLP 10/12 on diet alone showed TChol 203, TG 55, HDL 79, LDL 122... She stopped cres5 due to musc cramps.  DIABETES MELLITUS, BORDERLINE  (ICD-790.29) - FBS 90-115 over the last few yrs... diet Rx. ~  labs 2/09 showed BS= 110, HgA1c= 6.2.Marland Kitchen. rec> diet Rx, get wt down. ~  labs 12/09 showed BS= 102, HgA1c= 6.2 ~  labs 4/10 showed BS= 110, A1c= 6.4 ~  labs 1/11 showed BS= 103... keep up the godd work. ~  labs 10/11 showed BS= 79, A1c= 6.0 ~  Labs 10/12 on diet alone showed BS= 98, A1c= 6.1  MORBID OBESITY (ICD-278.01) - we have discussed Bariatric Surg and she has several relatives who have had surg... "I'm afraid"- pt encouraged to seek counsel at [facility]. ~  weight 2/09 = 247#... 5'1" tall... BMI=47...  ~  weight 3/10 = 256#... we reviewed diet + exercise therapy... ~  weight 1/11 = 240# ~  weight 6/11 = 223#... great job!!! ~  Weight 10/11 = 217# ~  Weight 4/12 = 219# ~  Weight 10/12 = 225#  OTHER DYSPHAGIA (ICD-787.29) - c/o upper esoph dysphagia w/ discomfort... no response to Zantac, but sl better w/ Alpraz... referred to GI, DrPatterson 1/11 w/ EGD showing mult benign gastric polyps, ? extrinsic compression> subseq CT Chest was neg x for coronary calcif seen... globus symptoms improved on HYDROXYZINE 25mg  Prn & she continues on ZANTAC vs PRILOSEC...  IRRITABLE BOWEL SYNDROME (ICD-564.1) - last colon 5/06 by DrPatterson... normal exam.  SPONDYLOSIS, LUMBAR (ICD-721.3) - she had MRI 1/08 by DrKramer showing bulging discs and some facet arthropathy... takes ROBAXIN 500mg  Tid Prn, and TRAMADOL 50mg  Tid Prn...  Hx of HEADACHE (ICD-784.0) - eval in the [facility] in 2003... she uses Ibuprofen Prn...  Hx of PSEUDOTUMOR CEREBRI (ICD-348.2) - prev followed by Deland Pretty and DrMartin at [place], plus DrLove in [place]... ~  Mar10: pt had eye check recently at [facility] and was told OK...  ANXIETY (ICD-300.00) - has used XANAX 0.5mg  as needed in past...  ALOPECIA (ICD-704.00) & Seborrheic Dermatitis treated by DrJordan for Derm...   No past surgical history on file.   Outpatient Encounter Prescriptions as of  02/05/2012  Medication Sig Dispense Refill  . A/B OTIC otic solution PLACE 2 DROPS IN AFFECTEDEAR 3 TIMES A DAY AS NEEDED  15 mL  2  . ALPRAZolam (XANAX) 0.5 MG tablet 1/2 - 1 tablet by mouth three times daily as needed for nerves  90 tablet  5  . aspirin 81 MG tablet Take 81 mg by mouth daily.        . calcium carbonate (OS-CAL) 600 MG TABS Take 600 mg by mouth daily.        Marland Kitchen  candesartan (ATACAND) 32 MG tablet Take 1 tablet (32 mg total) by mouth daily.  90 tablet  3  . diazepam (VALIUM) 5 MG tablet Take 1/2 to 1 tablet by mouth three times daily as needed for nerves  90 tablet  5  . dicyclomine (BENTYL) 20 MG tablet Take 1 tablet (20 mg total) by mouth 3 (three) times daily as needed (abdominal cramping).  90 tablet  0  . furosemide (LASIX) 40 MG tablet Take 1 tablet (40 mg total) by mouth daily.  90 tablet  3  . HYDROcodone-homatropine (HYDROMET) 5-1.5 MG/5ML syrup 1 tsp every 4-6 hrs as needed for cough  240 mL  0  . methocarbamol (ROBAXIN) 500 MG tablet TAKE 1 TABLET BY MOUTH 4 TIMES DAILY AS NEEDED FORMUSCLE SPASMS  100 tablet  6  . Multiple Vitamin (MULTIVITAMIN) capsule Take 1 capsule by mouth daily.        . ranitidine (ZANTAC) 75 MG tablet Take 1-2 tablets as needed for stomach acid       . traMADol (ULTRAM) 50 MG tablet TAKE 1 TABLET BY MOUTH 4 TIMES DAILY AS NEEDED FORPAIN  100 tablet  6    Allergies  Allergen Reactions  . Avelox (Moxifloxacin Hcl In Nacl) Anaphylaxis    Pt c/o swelling of her throat and tongue  . Codeine     REACTION: causes her heart to race  . Crestor (Rosuvastatin Calcium)     Pt states INTOLERANT w/ muscle cramps  . Penicillins     REACTION: rash  . Pravastatin Sodium (Pravachol)     REACTION: intolerant= headache & insomnia  . Shellfish Allergy     Rash and itching  . Simvastatin     REACTION: intolerant= headache & insomnia    Current Medications, Allergies, Past Medical History, Past Surgical History, Family History, and Social History were  reviewed in [employer] record.    Review of Systems         See HPI - all other systems neg except as noted... The patient complains of dyspnea on exertion.  The patient denies anorexia, fever, weight loss, weight gain, vision loss, decreased hearing, hoarseness, chest pain, syncope, peripheral edema, prolonged cough, headaches, hemoptysis, abdominal pain, melena, hematochezia, severe indigestion/heartburn, hematuria, incontinence, muscle weakness, suspicious skin lesions, transient blindness, difficulty walking, depression, unusual weight change, abnormal bleeding, enlarged lymph nodes, and angioedema.     Objective:   Physical Exam     WD, Obese, 56 y/o BF in NAD... GENERAL:  Alert & oriented; pleasant & cooperative. HEENT:  [place]/AT, EOM-wnl, PERRLA, EACs-clear, TMs-wnl, NOSE-clear, THROAT-clear & wnl. NECK:  Supple w/ fairROM; no JVD; normal carotid impulses w/o bruits; no thyromegaly or nodules palpated; no lymphadenopathy. CHEST:  Clear to P & A; without wheezes/ rales/ or rhonchi. HEART:  Regular Rhythm; without murmurs/ rubs/ or gallops. ABDOMEN:  Obese, soft & nontender; + panniculus; normal bowel sounds; no organomegaly or masses detected. EXT:  mild arthritic deformities bilat knees; no varicose veins/ +venous insuffic/ tr edema. NEURO:  CN's intact;  no focal neuro deficits... DERM:  No lesions noted; no rash etc...   Assessment & Plan:   HBP>  Fair control on her ARB/ Diuretic; needs better job w/ wt reduction, diet, etc & we discussed this...  HYPERLIPID>  She has been followed in the Lipid clinic but was apparently unable to tol even 5mg  Crestor, and had to stop; on diet alone & f/u FLP looks somewhat better, continue diet efforts & get  wt down...  DM, Borderline>  This has been well controlled on diet alone, min DM, but at risk for worsening glucose control & needs to lose the wt...  GI>  Hx dyspagia, IBS, etc...globus symptoms improved w/ prev  Hydroxyzine, neg colon in 2006; she denies current problems...  LBP>  Stable on Tramadol & Robaxin... Needs to lose the weight...  Anxiety>  Under a lot of stress as noted & she wants a stronger med; try changing Alpraz to VALIUM 5mg  Tid as needed.Marland KitchenMarland Kitchen

## 2012-02-06 NOTE — Patient Instructions (Signed)
Today we updated your med list in our EPIC system...    Continue your current medications the same...  We completed the FMLA form together today & we will fax it to the appropriate authorities per your request...  If we can be of further assistance in this matter please let me know.Marland KitchenMarland Kitchen

## 2012-02-06 NOTE — Progress Notes (Signed)
Subjective:    Patient ID: Susan [name], female    DOB: 1956-02-07, 56 y.o.   MRN: [id number]  HPI 56 y/o BF here for a yearly follow up visit... she has mult medical problems including: HBP, Chol, CWP, HA's w/ pseudotumor cerebri, and anxiety...   ~  February 13, 2011:  344mo ROV- c/o recent URI w/ cough, green sputum, head & chest congestion, sore throat, low grade temp, etc;  She was visiting relatives in [place], we called in ZPak & Hycodan but no better ==> Decided to check CXR (low lung vols, ectatic Ao, NAD) & Rx w/ Depo/ Dosepak, Avelox, Mucinex, etc...  Otherw stable> BP controlled on meds;  She now agrees to go to the Lipid clinic to help w/ her Chol & known coronary calcif;  BS remains OK on diet alone but wt loss has ceased & needs better diet/ exercise/ etc;  Requests refill meds... NOTE: ?Avelox caused throat swelling & she was changed to Doxy...  ~  August 14, 2011:  344mo ROV & she was started on Cres5 by the Lipid Clinic but she stopped already due to cramps she says;  She is under a lot of stress (Bro died in house fire, Father w/ bilat amput due to DM, gangrene) and wants something stronger than Xanax for nerves (we discussed switch to Valium5);  She refuses the Flu vaccine & will ret for fasting labs==> FLP fair w/ [id number], [id number], BS98, A1c6.1 >> rec to continue diet efforts...  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  We discussed Rx w/ Metamucil for the diarrhea & stool normalization, and BENTYL 20mg  Tid as needed for the abd cramping pain... 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 On the FMLA form item #7 was of primary importance to her:  She states episodic flair ups of her condition would be expected to occur 2 times per month & last approx 2-3 days w/ each occurrence (& require her to be out of work for these flair ups)...  FMLA form completed & faxed at her request...         Problem List:    PROBLEM LIST UPDATED 01/07/12 >>  HYPERTENSION (ICD-401.9) - controlled on ATACAND 32mg /d and FUROSEMIDE 40mg /d...  ~  10/11:  BP= 126/84 & taking meds  regularly/ tolerating well> checks BP occas at home and usually runs 120's/ 80's; denies HA, fatigue, visual changes, CP, palipit, dizziness, syncope, dyspnea, edema, etc... ~  4/12:  BP= 128/88 & asked to monitor BP at home more often; we discussed the importance of low sodium & weight reduction for her BP control. ~  10/12:  BP= 124/90 & wt is up to 225#; asked to continue same meds & get wt down! ~  3/13:  BP= 140/80 & she has mult minor somatic complaints...  Hx of CHEST PAIN UNSPECIFIED (ICD-786.50) - on ASA 81mg /d... prev hx CWP; asked to diet, exercise, get wt down. ~  baseline EKG = NSR, WNL.Marland Kitchen. ~  CT Chest 1/11 showed incidental calcif in coronary art, otherw CT was neg... ~  CXR 4/12 showed low lung vol w/ crowding of markings,  NAD...  VENOUS INSUFFICIENCY (ICD-459.81) - she has trace edema & knows to elim salt, elevate legs, wear support hose, & take her Lasix.  HYPERLIPIDEMIA (ICD-272.4) - on diet Rx alone; tried Simv40 in 2009 & Prav40 in 2010 but INTOL w/ HA & insomnia; referred to [facility]inic in 2012 w/ Crestor5 started but she stopped that too due to cramps... ~  FLP 4/07 showed TChol 191, TG 70, HDL 61, LDL 116 ~  FLP 2/09 showed TChol 224, Tg 85, HDL 71, LDL 135 ~  FLP 12/09 showed TChol 207, TG 73, HDL 58, LDL 138 ~  FLP 4/10 showed TChol 222, TG 92, HDL 70, LDL 135 ~  FLP 1/11 showed TChol 206, TG 77, HDL 66, LDL 130... not at goal, declines [facility]id[facility]inic. ~  FLP 10/11 showed TChol 228, TG 64, HDL 76, LDL 134... not at goal, declines low dose intermit Crestor. ~  4/12:  We reviewed her prev results & the need for meds> she agrees to Lipid Clinic referral... ~  2012:  Lipid Clinic tried Crestor 5mg  but she stopped on her own due to muscle cramps & didn't ret to [facility]... ~  FLP 10/12 on diet alone showed TChol 203, TG 55, HDL 79, LDL 122... She refuses meds & refuses LC, will "do diet alone"  DIABETES MELLITUS, BORDERLINE (ICD-790.29) - on diet Rx alone but has been unable to lose wt, not exercising, etc... ~  labs 2/09 showed BS= 110, HgA1c= 6.2.Marland Kitchen. rec> diet Rx, get wt down. ~  labs 12/09 showed BS= 102, HgA1c= 6.2 ~  labs 4/10 showed BS= 110, A1c= 6.4 ~  labs 1/11 showed BS= 103... keep up the good work. ~  labs 10/11 showed BS= 79, A1c= 6.0 ~  Labs 10/12 on diet alone showed BS= 98, A1c= 6.1  MORBID OBESITY (ICD-278.01) - we have discussed Bariatric Surg and she has several relatives who have had surg> "I'm afraid"- pt encouraged to seek counsel at [facility]. ~  weight 2/09 = 247#... 5'1" tall... BMI=47...  ~  weight 3/10 = 256#... we reviewed diet + exercise therapy... ~  weight 1/11 = 240# ~  weight 6/11 = 223#... great job!!! ~  Weight 10/11 = 217# ~  Weight 4/12 = 219# ~  Weight 10/12 =  225# ~  Weight 3/13 = 226#  OTHER DYSPHAGIA (ICD-787.29) - prev hx upper esoph dysphagia w/ discomfort... no response to Zantac, but sl better w/ Alpraz... referred to GI, DrPatterson 1/11 w/ EGD showing mult benign gastric polyps, ?extrinsic compression> subseq CT Chest was neg x for coronary calcif seen... globus symptoms improved on HYDROXYZINE 25mg  Prn & she continues on  ZANTAC vs PRILOSEC...  IRRITABLE BOWEL SYNDROME (ICD-564.1) - last colon 5/06 by DrPatterson... normal exam.  SPONDYLOSIS, LUMBAR (ICD-721.3) - she had MRI 1/08 by DrKramer showing bulging discs and some facet arthropathy... takes ROBAXIN 500mg  Tid Prn, and TRAMADOL 50mg  Tid Prn...  Hx of HEADACHE (ICD-784.0) - eval in the [facility] in 2003... she uses Ibuprofen Prn...  Hx of PSEUDOTUMOR CEREBRI (ICD-348.2) - prev followed by Deland Pretty and DrMartin at [place], plus DrLove in [place]... ~  Mar10: pt had eye check recently at [facility] and was told OK...  ANXIETY (ICD-300.00) - has used XANAX 0.5mg  as needed;  Also had Valium on her list but prefers the [employer] Rx.  ALOPECIA (ICD-704.00) & Seborrheic Dermatitis treated by DrJordan for Derm... ~  10/11: saw Derm DrJordan recently w/ seb dermatitis & given Doryx, Olux-E foam, Zinc shampoo, & Clobetasol shampoo   No past surgical history on file.   Outpatient Encounter Prescriptions as of 02/05/2012  Medication Sig Dispense Refill  . A/B OTIC otic solution PLACE 2 DROPS IN AFFECTEDEAR 3 TIMES A DAY AS NEEDED  15 mL  2  . ALPRAZolam (XANAX) 0.5 MG tablet 1/2 - 1 tablet by mouth three times daily as needed for nerves  90 tablet  5  . aspirin 81 MG tablet Take 81 mg by mouth daily.        . calcium carbonate (OS-CAL) 600 MG TABS Take 600 mg by mouth daily.        . candesartan (ATACAND) 32 MG tablet Take 1 tablet (32 mg total) by mouth daily.  90 tablet  3  . diazepam (VALIUM) 5 MG tablet Take 1/2 to 1 tablet by mouth three times daily as needed for nerves  90 tablet  5  .  dicyclomine (BENTYL) 20 MG tablet Take 1 tablet (20 mg total) by mouth 3 (three) times daily as needed (abdominal cramping).  90 tablet  0  . furosemide (LASIX) 40 MG tablet Take 1 tablet (40 mg total) by mouth daily.  90 tablet  3  . HYDROcodone-homatropine (HYDROMET) 5-1.5 MG/5ML syrup 1 tsp every 4-6 hrs as needed for cough  240 mL  0  . methocarbamol (ROBAXIN) 500 MG tablet TAKE 1 TABLET BY MOUTH 4 TIMES DAILY AS NEEDED FORMUSCLE SPASMS  100 tablet  6  . Multiple Vitamin (MULTIVITAMIN) capsule Take 1 capsule by mouth daily.        . ranitidine (ZANTAC) 75 MG tablet Take 1-2 tablets as needed for stomach acid       . traMADol (ULTRAM) 50 MG tablet TAKE 1 TABLET BY MOUTH 4 TIMES DAILY AS NEEDED FORPAIN  100 tablet  6    Allergies  Allergen Reactions  . Avelox (Moxifloxacin Hcl In Nacl) Anaphylaxis    Pt c/o swelling of her throat and tongue  . Codeine     REACTION: causes her heart to race  . Crestor (Rosuvastatin Calcium)     Pt states INTOLERANT w/ muscle cramps  . Penicillins     REACTION: rash  . Pravastatin Sodium (Pravachol)     REACTION: intolerant= headache & insomnia  . Shellfish Allergy     Rash and itching  . Simvastatin     REACTION: intolerant= headache & insomnia    Current Medications, Allergies, Past Medical History, Past Surgical History, Family History, and Social History were reviewed in [employer] record.    Review of Systems         See HPI - all  other systems neg except as noted... The patient complains of dyspnea on exertion.  The patient denies anorexia, fever, weight loss, weight gain, vision loss, decreased hearing, hoarseness, chest pain, syncope, peripheral edema, prolonged cough, headaches, hemoptysis, abdominal pain, melena, hematochezia, severe indigestion/heartburn, hematuria, incontinence, muscle weakness, suspicious skin lesions, transient blindness, difficulty walking, depression, unusual weight change, abnormal bleeding,  enlarged lymph nodes, and angioedema.     Objective:   Physical Exam     WD, Obese, 56 y/o BF in NAD... GENERAL:  Alert & oriented; pleasant & cooperative. HEENT:  [place]/AT, EOM-wnl, PERRLA, EACs-clear, TMs-wnl, NOSE-clear, THROAT-clear & wnl. NECK:  Supple w/ fairROM; no JVD; normal carotid impulses w/o bruits; no thyromegaly or nodules palpated; no lymphadenopathy. CHEST:  Clear to P & A; without wheezes/ rales/ or rhonchi. HEART:  Regular Rhythm; without murmurs/ rubs/ or gallops. ABDOMEN:  Obese, soft & nontender; + panniculus; normal bowel sounds; no organomegaly or masses detected. EXT:  mild arthritic deformities bilat knees; no varicose veins/ +venous insuffic/ tr edema. NEURO:  CN's intact;  no focal neuro deficits... DERM:  No lesions noted; no rash etc...  RADIOLOGY DATA:  Reviewed in the EPIC EMR & discussed w/ the patient...  LABORATORY DATA:  Reviewed in the EPIC EMR & discussed w/ the patient...   Assessment & Plan:   FMLA form completion>  She refused VS this admission & only wanted to be sure the FMLA form was completed to her satisfaction...  IBS>  She is c/o IBS symptoms but they seem rather mild & we discussed Rx w/ Metamucil daily as stool normalizer & BENTYL 20mg  Tid as needed for cramping; if symptoms worsen then she will f/u w/ GI, DrPatterson; she is under a lot of stress & this can exac the IBS, she has Alpraz for Prn use...  Anxiety>  Under a lot of stress as noted & she prefers the Alpraz over the Valium; wants FMLA form filled out for work stress but doesn't have the form...   HBP>  Fair control on her ARB/ Diuretic; needs better job w/ wt reduction, diet, etc & we discussed this...  HYPERLIPID>  She has been followed in the Lipid Clinic but was apparently unable to tol even 5mg  Crestor, and had to stop; on diet alone & f/u FLP looks somewhat better, continue diet efforts & get wt down...  DM, Borderline>  This has been well controlled on diet alone, min  DM, but at risk for worsening glucose control & needs to lose the wt...  GI>  Hx dyspagia, IBS, etc...globus symptoms improved w/ prev Hydroxyzine, neg colon in 2006; she has mult somatic complaints...  LBP>  Stable on Tramadol & Robaxin... Needs to lose the weight...   Patient's Medications  New Prescriptions   No medications on file  Previous Medications   A/B OTIC OTIC SOLUTION    PLACE 2 DROPS IN AFFECTEDEAR 3 TIMES A DAY AS NEEDED   ALPRAZOLAM (XANAX) 0.5 MG TABLET    1/2 - 1 tablet by mouth three times daily as needed for nerves   ASPIRIN 81 MG TABLET    Take 81 mg by mouth daily.     CALCIUM CARBONATE (OS-CAL) 600 MG TABS    Take 600 mg by mouth daily.     CANDESARTAN (ATACAND) 32 MG TABLET    Take 1 tablet (32 mg total) by mouth daily.   DIAZEPAM (VALIUM) 5 MG TABLET    Take 1/2 to 1 tablet by mouth three times daily as  needed for nerves   DICYCLOMINE (BENTYL) 20 MG TABLET    Take 1 tablet (20 mg total) by mouth 3 (three) times daily as needed (abdominal cramping).   FUROSEMIDE (LASIX) 40 MG TABLET    Take 1 tablet (40 mg total) by mouth daily.   HYDROCODONE-HOMATROPINE (HYDROMET) 5-1.5 MG/5ML SYRUP    1 tsp every 4-6 hrs as needed for cough   METHOCARBAMOL (ROBAXIN) 500 MG TABLET    TAKE 1 TABLET BY MOUTH 4 TIMES DAILY AS NEEDED FORMUSCLE SPASMS   MULTIPLE VITAMIN (MULTIVITAMIN) CAPSULE    Take 1 capsule by mouth daily.     RANITIDINE (ZANTAC) 75 MG TABLET    Take 1-2 tablets as needed for stomach acid    TRAMADOL (ULTRAM) 50 MG TABLET    TAKE 1 TABLET BY MOUTH 4 TIMES DAILY AS NEEDED FORPAIN  Modified Medications   No medications on file  Discontinued Medications   No medications on file

## 2012-02-13 ENCOUNTER — Ambulatory Visit: Payer: PRIVATE HEALTH INSURANCE | Admitting: Pulmonary Disease

## 2012-03-20 ENCOUNTER — Telehealth: Payer: Self-pay | Admitting: Pulmonary Disease

## 2012-03-20 MED ORDER — CANDESARTAN CILEXETIL 32 MG PO TABS: 32.0000 mg | ORAL_TABLET | Freq: Every day | ORAL | Status: DC

## 2012-03-20 NOTE — Telephone Encounter (Signed)
I spoke with pt and she states she received a letter from [facility] stating SN had authorized her atacan be changed to irbesartan on 03/12/12. I advised pt i do not see any record of this. This hasn't been changed yet. She states [facility] is going to be calling us today or tomorrow and had advised pt to call us and make [place] aware they will be calling. Also she stated the atacand became generic this week and needs a new rx [facility] to [facility] but 1st per pt [facility] has to call and speak with [place] first regarding this. Will await [facility] call

## 2012-03-20 NOTE — Telephone Encounter (Signed)
rx has been sent for the atacand bc this was never changed by our office. Pt aware rx has been sent

## 2012-03-21 ENCOUNTER — Telehealth: Payer: Self-pay | Admitting: Pulmonary Disease

## 2012-03-21 NOTE — Telephone Encounter (Signed)
I spoke with Susan [name] and advised her we sent rx in yesterday for this. She states it takes them about 24 hours to receive it and nothing further was needed

## 2012-03-24 ENCOUNTER — Telehealth: Payer: Self-pay | Admitting: Pulmonary Disease

## 2012-03-24 MED ORDER — CANDESARTAN CILEXETIL 32 MG PO TABS: 32.0000 mg | ORAL_TABLET | Freq: Every day | ORAL | Status: DC

## 2012-03-24 NOTE — Telephone Encounter (Signed)
Spoke to [facility] but they do not know what they were calling for so they will research the notes and give [place] a call back

## 2012-03-24 NOTE — Telephone Encounter (Signed)
[facility] so i gave a new one verbally for the same drug atacand that was sent on 5/31

## 2012-03-24 NOTE — Telephone Encounter (Signed)
"  Fornel" from [facility] called back. Says they just need a verbal for rx atacand 32mg - call same # as before then press option 2. Hazel Sams

## 2012-05-08 ENCOUNTER — Other Ambulatory Visit: Payer: Self-pay | Admitting: Pulmonary Disease

## 2012-07-10 ENCOUNTER — Ambulatory Visit: Payer: PRIVATE HEALTH INSURANCE | Admitting: Pulmonary Disease

## 2012-07-14 ENCOUNTER — Telehealth: Payer: Self-pay | Admitting: Pulmonary Disease

## 2012-07-14 MED ORDER — ALPRAZOLAM 0.5 MG PO TABS: ORAL_TABLET | ORAL | Status: DC

## 2012-07-14 NOTE — Telephone Encounter (Signed)
Pt called back.  Please call her @ [phone] Leanora Ivanoff

## 2012-07-14 NOTE — Telephone Encounter (Signed)
Called and spoke with pt and she is aware of rx called to the pharmacy.  Nothing further is needed.

## 2012-07-14 NOTE — Telephone Encounter (Signed)
Pt is requesting refill on xanax 0.5 mg. Last refilled 12/27/11 #90 x 5 refills. Last OV 01/26/12. Pending OV 08/13/12. Please advised if okay to refill Dr. Kriste Basque thanks

## 2012-07-15 ENCOUNTER — Telehealth: Payer: Self-pay | Admitting: Pulmonary Disease

## 2012-07-15 NOTE — Telephone Encounter (Signed)
Spoke with pharmacist and notified alprazolam should be the 0.5 mg 1/2 to 1 tablet tidprn She verbalized understanding and states nothing further needed.

## 2012-07-30 ENCOUNTER — Telehealth: Payer: Self-pay | Admitting: Pulmonary Disease

## 2012-07-30 ENCOUNTER — Encounter: Payer: Self-pay | Admitting: *Deleted

## 2012-07-30 NOTE — Telephone Encounter (Signed)
Appt made with GI for tomorrow at 130pm with Gunnar Fusi NP; pt is aware of appt and will be there at 115pm to check in.

## 2012-07-30 NOTE — Telephone Encounter (Signed)
Per SN---will need to see GI ---follow up ASAP---ok to see MD or NP.  thanks

## 2012-07-30 NOTE — Telephone Encounter (Signed)
Spoke with pt. She states that she is having abd pain x 1 wk "whole stomach aches". She states also has had black stools for the past several days. No constipation or diarrhea. No fever. Taking bentyl and metamucil without relief. Has seen Dr. Marina Goodell in the past but this was "years ago".  SN, please advise, thanks! Allergies  Allergen Reactions  . Avelox (Moxifloxacin Hcl In Nacl) Anaphylaxis    Pt c/o swelling of her throat and tongue  . Codeine     REACTION: causes her heart to race  . Crestor (Rosuvastatin Calcium)     Pt states INTOLERANT w/ muscle cramps  . Penicillins     REACTION: rash  . Pravastatin Sodium (Pravachol)     REACTION: intolerant= headache & insomnia  . Shellfish Allergy     Rash and itching  . Simvastatin     REACTION: intolerant= headache & insomnia

## 2012-07-31 ENCOUNTER — Encounter: Payer: Self-pay | Admitting: Nurse Practitioner

## 2012-07-31 ENCOUNTER — Other Ambulatory Visit (INDEPENDENT_AMBULATORY_CARE_PROVIDER_SITE_OTHER): Payer: PRIVATE HEALTH INSURANCE

## 2012-07-31 ENCOUNTER — Ambulatory Visit (INDEPENDENT_AMBULATORY_CARE_PROVIDER_SITE_OTHER): Payer: PRIVATE HEALTH INSURANCE | Admitting: Nurse Practitioner

## 2012-07-31 VITALS — BP 150/90 | HR 76 | Ht 59.5 in | Wt 230.2 lb

## 2012-07-31 DIAGNOSIS — R195 Other fecal abnormalities: Secondary | ICD-10-CM

## 2012-07-31 DIAGNOSIS — R1030 Lower abdominal pain, unspecified: Secondary | ICD-10-CM

## 2012-07-31 DIAGNOSIS — R109 Unspecified abdominal pain: Secondary | ICD-10-CM

## 2012-07-31 DIAGNOSIS — R110 Nausea: Secondary | ICD-10-CM

## 2012-07-31 DIAGNOSIS — K589 Irritable bowel syndrome without diarrhea: Secondary | ICD-10-CM

## 2012-07-31 LAB — CBC WITH DIFFERENTIAL/PLATELET
Basophils Absolute: 0 10*3/uL (ref 0.0–0.1)
Basophils Relative: 0.3 % (ref 0.0–3.0)
Eosinophils Absolute: 0.1 10*3/uL (ref 0.0–0.7)
Eosinophils Relative: 0.9 % (ref 0.0–5.0)
HCT: 40 % (ref 36.0–46.0)
Hemoglobin: 12.8 g/dL (ref 12.0–15.0)
Lymphocytes Relative: 29.2 % (ref 12.0–46.0)
Lymphs Abs: 2.9 10*3/uL (ref 0.7–4.0)
MCHC: 32 g/dL (ref 30.0–36.0)
MCV: 91.9 fl (ref 78.0–100.0)
Monocytes Absolute: 0.7 10*3/uL (ref 0.1–1.0)
Monocytes Relative: 7.2 % (ref 3.0–12.0)
Neutro Abs: 6.1 10*3/uL (ref 1.4–7.7)
Neutrophils Relative %: 62.4 % (ref 43.0–77.0)
Platelets: 404 10*3/uL — ABNORMAL HIGH (ref 150.0–400.0)
RBC: 4.36 Mil/uL (ref 3.87–5.11)
RDW: 13.6 % (ref 11.5–14.6)
WBC: 9.8 10*3/uL (ref 4.5–10.5)

## 2012-07-31 NOTE — Progress Notes (Signed)
07/31/2012 Susan [name] [id number] 04-Oct-1956   History of Present Illness:   Patient is a 56 year old female known to Dr. Jarold Motto who does her screening colonoscopies. Patient has a family history of colon cancer. Her last colonoscopy was May 2006, it was normal. Patient was last seen January 2011 and that was for evaluation of globus sensation. She underwent EGD with findings of multiple polyps in the fundus. She had an extrinsic compression of the upper esophagus, a CT scan of the chest was ordered for evaluation. Nothing seen to explain endoscopic findings.  Patient gives a history of iritable bowel syndrome. Her symptoms usually consist of lower abdominal cramping, loose stool and nausea. Her last flare was about one month ago. Patient comes in today with recurrent flare like symptoms. She has lower abdominal cramping, nausea and loose stool. No profuse diarrhea. Patient really came in because of concern for black stools. She did take Pepto-Bismol a couple of days ago but cannot remember if black stools predate that. Patient does take daily ibuprofen, she is not on a PPI. No upper abdominal pain.Her appetite is okay.  In between her irritable bowel flares patient feels okay. No recent antibiotic use. Contact with sick people unknown, she does work out in [employer]. Patient visit her father in the nursing home.  Current Medications, Allergies, Past Medical History, Past Surgical History, Family History and Social History were reviewed in [employer] record.  Physical Exam: General: Well developed , black female in no acute distress Head: Normocephalic and atraumatic Eyes:  sclerae anicteric, conjunctiva pink  Ears: Normal auditory acuity Lungs: Clear throughout to auscultation Heart: Regular rate and rhythm Abdomen: Soft, mild epigastric tenderness,/ lower quadrant tenderness . No masses, no hepatomegaly. Normal bowel sounds Rectal: Scant amount of light  yellow stool, Hemoccult negative. Musculoskeletal: Symmetrical with no gross deformities  Extremities: No edema  Neurological: Alert oriented x 4, grossly nonfocal Psychological:  Alert and cooperative. Normal mood and affect  Assessment and Recommendations:  1. lower abdominal cramping, loose stool (no profuse diarrhea), nausea. Symptoms have been present for one week. Patient gives a history of irritable bowel syndrome with similar symptoms. She takes dicyclomine during her irritable bowel syndrome flares. Suspect symptoms secondary to irritable bowel syndrome and patient agrees. Patient mainly here today because of black stools, see #2. For her IBS symptoms patient will restart Bentyl TID which she has at home. Patient will call early next week if no improvement.  2. Abnormal stool content. Patient complains of black stools. She did have some Pepto-Bismol a few days ago, not sure when black stools started in relation to that. Shee has scant light yellow stool in vault which is Hemoccult-negative today. No pallor on exam, will check a CBC

## 2012-07-31 NOTE — Patient Instructions (Addendum)
Please go to the basement level to have your labs drawn.  Take Bentyl 2-3 times a day as needed for cramping and spasms. Call us next week and ask for Erastus Bartolomei, [phone] to let us know how they are doing, especially if you are not doing better.  We have given you a probiotic, Align,  to take 1 capsule daily for 14 days.

## 2012-08-06 ENCOUNTER — Telehealth: Payer: Self-pay | Admitting: Gastroenterology

## 2012-08-06 DIAGNOSIS — R197 Diarrhea, unspecified: Secondary | ICD-10-CM

## 2012-08-06 NOTE — Telephone Encounter (Signed)
Informed pt of the need for stool studies; she will pick up the containers and I will order the drugs once the samples are in; pt stated understanding.

## 2012-08-06 NOTE — Telephone Encounter (Signed)
Susan [name], Yes, she needs an antibiotic is crampy diarrhea not any better. Please ask her to get stool culture, stool lactoferrin and C-Diff PCR. Then, after submission of samples can start flagyl 250mg  bid and Cipro 250 mg bid for 7 days. She can take Immodium a couple of times a day if needed. Please make sure she has a follow up in 2 weeks. I will be happy to see her back. Thanks

## 2012-08-06 NOTE — Telephone Encounter (Signed)
lmom for pt to call back on cell and home numbers.

## 2012-08-06 NOTE — Telephone Encounter (Signed)
Pt saw Willette Cluster, NP on 07/31/12 for lower abdominal cramping, loose stools and nausea x 1 week; also had some black stools, but she had taken Pepto Bismol a couple of times. She was to take Bentyl TID and call back if no better. Today, pt reports her abdominal pain is no better; she has been taking the Bentyl tid as ordered. She does report her stools are back to normal. Pt thinks maybe she has a virus or bug because her IBS doesn't last this long. Gunnar Fusi, please advise; pt states you mentioned an antibiotic if no better. Thanks.

## 2012-08-08 ENCOUNTER — Other Ambulatory Visit: Payer: PRIVATE HEALTH INSURANCE

## 2012-08-08 DIAGNOSIS — R197 Diarrhea, unspecified: Secondary | ICD-10-CM

## 2012-08-09 LAB — FECAL LACTOFERRIN, QUANT: Lactoferrin: NEGATIVE

## 2012-08-11 LAB — CLOSTRIDIUM DIFFICILE BY PCR: Toxigenic C. Difficile by PCR: NOT DETECTED

## 2012-08-11 NOTE — Telephone Encounter (Signed)
Gunnar Fusi, spoke with pt to in form her her stool tests were negative and was she still having problems; did she need the antibiotics? Pt stated she is some better, but order the drugs. When I brought up Cipro, it had a contraindication d/t pt had an anaphylactic reaction to Avelox; please advise. Thanks.

## 2012-08-12 ENCOUNTER — Encounter: Payer: Self-pay | Admitting: *Deleted

## 2012-08-12 LAB — STOOL CULTURE

## 2012-08-12 MED ORDER — METRONIDAZOLE 250 MG PO TABS: ORAL_TABLET | ORAL | Status: DC

## 2012-08-12 NOTE — Telephone Encounter (Signed)
Susan [name], we can just call in  Flagyl. Measure she does not drink alcohol while taking Flagyl. Thanks

## 2012-08-12 NOTE — Addendum Note (Signed)
Addended by: Florene Glen on: 08/12/2012 03:39 PM   Modules accepted: Orders

## 2012-08-12 NOTE — Telephone Encounter (Signed)
Notified pt I ordered her Flagyl at [facility]; we did not order Cipro d/t a reaction warning. Pt stated understanding and will call back if no better with med.

## 2012-08-13 ENCOUNTER — Encounter: Payer: Self-pay | Admitting: Pulmonary Disease

## 2012-08-13 ENCOUNTER — Ambulatory Visit (INDEPENDENT_AMBULATORY_CARE_PROVIDER_SITE_OTHER): Payer: PRIVATE HEALTH INSURANCE | Admitting: Pulmonary Disease

## 2012-08-13 ENCOUNTER — Other Ambulatory Visit (INDEPENDENT_AMBULATORY_CARE_PROVIDER_SITE_OTHER): Payer: PRIVATE HEALTH INSURANCE

## 2012-08-13 VITALS — BP 128/76 | HR 79 | Temp 97.7°F | Ht 61.0 in | Wt 229.2 lb

## 2012-08-13 DIAGNOSIS — I10 Essential (primary) hypertension: Secondary | ICD-10-CM

## 2012-08-13 DIAGNOSIS — E559 Vitamin D deficiency, unspecified: Secondary | ICD-10-CM

## 2012-08-13 DIAGNOSIS — F411 Generalized anxiety disorder: Secondary | ICD-10-CM

## 2012-08-13 DIAGNOSIS — R7309 Other abnormal glucose: Secondary | ICD-10-CM

## 2012-08-13 DIAGNOSIS — R109 Unspecified abdominal pain: Secondary | ICD-10-CM

## 2012-08-13 DIAGNOSIS — E785 Hyperlipidemia, unspecified: Secondary | ICD-10-CM

## 2012-08-13 DIAGNOSIS — R1030 Lower abdominal pain, unspecified: Secondary | ICD-10-CM

## 2012-08-13 DIAGNOSIS — K589 Irritable bowel syndrome without diarrhea: Secondary | ICD-10-CM

## 2012-08-13 DIAGNOSIS — I872 Venous insufficiency (chronic) (peripheral): Secondary | ICD-10-CM

## 2012-08-13 LAB — LIPID PANEL
Cholesterol: 255 mg/dL — ABNORMAL HIGH (ref 0–200)
HDL: 73.9 mg/dL (ref >39.00)
Total CHOL/HDL Ratio: 3
Triglycerides: 91 mg/dL (ref 0.0–149.0)
VLDL: 18.2 mg/dL (ref 0.0–40.0)

## 2012-08-13 LAB — HEPATIC FUNCTION PANEL
ALT: 14 U/L (ref 0–35)
AST: 17 U/L (ref 0–37)
Albumin: 3.9 g/dL (ref 3.5–5.2)
Alkaline Phosphatase: 88 U/L (ref 39–117)
Bilirubin, Direct: 0.1 mg/dL (ref 0.0–0.3)
Total Bilirubin: 0.5 mg/dL (ref 0.3–1.2)
Total Protein: 8.2 g/dL (ref 6.0–8.3)

## 2012-08-13 LAB — HEMOGLOBIN A1C: Hgb A1c MFr Bld: 6.1 % (ref 4.6–6.5)

## 2012-08-13 LAB — BASIC METABOLIC PANEL
BUN: 13 mg/dL (ref 6–23)
CO2: 31 mEq/L (ref 19–32)
Calcium: 9.5 mg/dL (ref 8.4–10.5)
Chloride: 104 mEq/L (ref 96–112)
Creatinine, Ser: 0.7 mg/dL (ref 0.4–1.2)
GFR: 112.49 mL/min (ref >60.00)
Glucose, Bld: 97 mg/dL (ref 70–99)
Potassium: 3.8 mEq/L (ref 3.5–5.1)
Sodium: 141 mEq/L (ref 135–145)

## 2012-08-13 LAB — LDL CHOLESTEROL, DIRECT: Direct LDL: 169 mg/dL

## 2012-08-13 MED ORDER — ALPRAZOLAM 0.5 MG PO TABS: ORAL_TABLET | ORAL | Status: DC

## 2012-08-13 MED ORDER — ANTIPYRINE-BENZOCAINE 5.4-1.4 % OT SOLN: OTIC | Status: DC

## 2012-08-13 MED ORDER — DICYCLOMINE HCL 20 MG PO TABS: 20.0000 mg | ORAL_TABLET | Freq: Three times a day (TID) | ORAL | Status: DC | PRN

## 2012-08-13 MED ORDER — HYDROCODONE-HOMATROPINE 5-1.5 MG/5ML PO SYRP: ORAL_SOLUTION | ORAL | Status: DC

## 2012-08-13 MED ORDER — CANDESARTAN CILEXETIL 32 MG PO TABS: 32.0000 mg | ORAL_TABLET | Freq: Every day | ORAL | Status: DC

## 2012-08-13 MED ORDER — FUROSEMIDE 40 MG PO TABS: 40.0000 mg | ORAL_TABLET | Freq: Every day | ORAL | Status: DC

## 2012-08-13 NOTE — Progress Notes (Signed)
Subjective:    Patient ID: Susan [name], female    DOB: [birth year], 56 y.o.   MRN: [id number]  HPI 56 y/o BF here for a yearly follow up visit... she has mult medical problems including: HBP, Chol, CWP, HA's w/ pseudotumor cerebri, and anxiety...   ~  February 13, 2011:  59mo ROV- c/o recent URI w/ cough, green sputum, head & chest congestion, sore throat, low grade temp, etc;  She was visiting relatives in [place], we called in ZPak & Hycodan but no better ==> Decided to check CXR (low lung vols, ectatic Ao, NAD) & Rx w/ Depo/ Dosepak, Avelox, Mucinex, etc...  Otherw stable> BP controlled on meds;  She now agrees to go to the Lipid clinic to help w/ her Chol & known coronary calcif;  BS remains OK on diet alone but wt loss has ceased & needs better diet/ exercise/ etc;  Requests refill meds... NOTE: ?Avelox caused throat swelling & she was changed to Doxy...  ~  August 14, 2011:  59mo ROV & she was started on Cres5 by the Lipid Clinic but she stopped already due to cramps she says;  She is under a lot of stress (Bro died in house fire, Father w/ bilat amput due to DM, gangrene) and wants something stronger than Xanax for nerves (we discussed switch to Valium5);  She refuses the Flu vaccine & will ret for fasting labs==> FLP fair w/ [id number], HYQ657, BS98, A1c6.1 >> rec to continue diet efforts...  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  We discussed Rx w/ Metamucil for the diarrhea & stool normalization, and BENTYL 20mg  Tid as needed for the abd cramping pain... See prob list below>>  ~  February 05, 2012:  39mo ROV & add-on appt because she needed the FMLA form for post office completed according to her directions (she refused vital signs this visit)> we reviewed the form partialy completed by [employer] & went thru it item by item today> she ok'd each item & stated this FMLA form is needed because of her GI issues- specifically her intermittent Dyspagia & globus symptom & her lower abd cramping & diarrhea related to her IBS; she has Alpraz 0,5mg  Tid for the former symptom and Metamucil/ Bentyl for the latter symptoms... On the FMLA form item #7 was of primary importance to her:  She states episodic flair ups of her condition would be expected to occur 2 times per month & last approx 2-3 days w/ each occurrence (& require her to be out of work for these flair ups)...  FMLA form completed & faxed at her request...  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     HBP> on Atacand32 & Lasix40; BP=128/76 & she  denies recent CP, palpit, SOB, ch in edema...    CP> on ASA81 (recently held by GI); she is too sedentary & asked to incr walking etc...    VI> she knows to avoid sodium & takes Lasix40;     Chol> on diet alone but numbers are poor w/ TChol 255, TG 91, HDL 74, LDL 169; states she is INTOL to all statins & has been in the [facility] before but refuses lipid clinic return or med trial; obviously she needs to do much better on diet...    DM> on diet alone; BS=97, A1c=6.1 and doing well but must lose wt & we discussed diet + exercise...    Obese> wt=229# up 4# from prev & BMI>40    GI> HxGlobus sensation, IBS +FamHx colon ca> treated w/ Bentyl20Tid prn, Zantac75Bid, Align...    LBP> uses OTC analgesics as needed; followed by DrKramer...    Pseudotumor cerebri>  see notes below...    Anxiety> on Xanax as needed & feels this works well... We reviewed prob list, meds, xrays and labs> see below for updates >> she declines Flu shot... LABS 10/13:  FLP- not at goals ondiet alone w/ TChol=255 LDL=169;  Chems- wnl;  CBC- wnl;  TSH=1.63;  VitD=33         Problem List:     HYPERTENSION (ICD-401.9) - controlled on ATACAND 32mg /d and FUROSEMIDE 40mg /d...  ~  10/11:  BP= 126/84 & taking meds regularly/ tolerating well> checks BP occas at home and usually runs 120's/ 80's; denies HA, fatigue, visual changes, CP, palipit, dizziness, syncope, dyspnea, edema, etc... ~  4/12:  BP= 128/88 & asked to monitor BP at home more often; we discussed the importance of low sodium & weight reduction for her BP control. ~  10/12:  BP= 124/90 & wt is up to 225#; asked to continue same meds & get wt down! ~  3/13:  BP= 140/80 & she has mult minor somatic complaints... ~  10/13:  BP= 128/76 on her 2 meds & she denies recent CP, palpit, dizzy, syncope, edema...  Hx of CHEST PAIN UNSPECIFIED (ICD-786.50) - on ASA 81mg /d... prev hx CWP; asked to diet, exercise, get wt down. ~  baseline EKG = NSR, WNL.Marland Kitchen. ~  CT Chest 1/11 showed incidental calcif in coronary art, otherw CT was neg... ~  CXR 4/12 showed low lung vol w/ crowding of markings, NAD...  VENOUS INSUFFICIENCY (ICD-459.81) - she has trace edema & knows to elim salt, elevate legs, wear support hose, & take her Lasix.  HYPERLIPIDEMIA (ICD-272.4) - on diet Rx alone; tried Simv40 in 2009 & Prav40 in 2010 but INTOL w/ HA & insomnia; referred to [facility]idClinic in 2012 w/ Crestor5 started but she stopped that too due to cramps... ~  FLP 4/07 showed TChol 191, TG 70, HDL 61, LDL 116 ~  FLP 2/09 showed TChol 224, Tg 85, HDL 71, LDL 135 ~  FLP 12/09 showed TChol 207, TG 73, HDL 58, LDL 138 ~  FLP 4/10 showed TChol 222, TG 92, HDL 70, LDL 135 ~  FLP 1/11 showed TChol 206, TG 77, HDL 66, LDL 130... not at goal, declines LipidClinic. ~  FLP  10/11 showed TChol 228, TG 64, HDL 76, LDL 134... not at goal, declines low dose intermit Crestor. ~  4/12:  We reviewed her prev results & the need for meds> she agrees to Lipid Clinic referral... ~  2012:  Lipid Clinic tried Crestor 5mg  but she stopped on her own due  to muscle cramps & didn't ret to [facility]... ~  FLP 10/12 on diet alone showed TChol 203, TG 55, HDL 79, LDL 122... She refuses meds & refuses LC, will "do diet alone" ~  FLP 10/13 on diet alone showed TChol 255, TG 91, HDL 74, LDL 169... Still refuses meds, I begged her to ret to the lipid clinic...  DIABETES MELLITUS, BORDERLINE (ICD-790.29) - on diet Rx alone but has been unable to lose wt, not exercising, etc... ~  labs 2/09 showed BS= 110, HgA1c= 6.2.Marland Kitchen. rec> diet Rx, get wt down. ~  labs 12/09 showed BS= 102, HgA1c= 6.2 ~  labs 4/10 showed BS= 110, A1c= 6.4 ~  labs 1/11 showed BS= 103... keep up the good work. ~  labs 10/11 showed BS= 79, A1c= 6.0 ~  Labs 10/12 on diet alone showed BS= 98, A1c= 6.1 ~  Labs 10/13 showed BS= 97, A1c= 6.1  MORBID OBESITY (ICD-278.01) - we have discussed Bariatric Surg and she has several relatives who have had surg> "I'm afraid"- pt encouraged to seek counsel at [facility]. ~  weight 2/09 = 247#... 5'1" tall... BMI=47...  ~  weight 3/10 = 256#... we reviewed diet + exercise therapy... ~  weight 1/11 = 240# ~  weight 6/11 = 223#... great job!!! ~  Weight 10/11 = 217# ~  Weight 4/12 = 219# ~  Weight 10/12 = 225# ~  Weight 3/13 = 226# ~  Weight 10/13 = 229#  OTHER DYSPHAGIA (ICD-787.29) - prev hx upper esoph dysphagia w/ discomfort... no response to Zantac, but sl better w/ Alpraz... referred to GI, DrPatterson 1/11 w/ EGD showing mult benign gastric polyps, ?extrinsic compression> subseq CT Chest was neg x for coronary calcif seen... globus symptoms improved on HYDROXYZINE 25mg  Prn & she continues on ZANTAC vs PRILOSEC...  IRRITABLE BOWEL SYNDROME (ICD-564.1) - last colon 5/06 by  DrPatterson... normal exam. ~  10/13: eval by GI for IBS symptoms> treated w/ bulk agents and BENTYL 20mg  Tid prn...  SPONDYLOSIS, LUMBAR (ICD-721.3) - she had MRI 1/08 by DrKramer showing bulging discs and some facet arthropathy... takes ROBAXIN 500mg  Tid Prn, and TRAMADOL 50mg  Tid Prn...  Hx of HEADACHE (ICD-784.0) - eval in the [facility] in 2003... she uses Ibuprofen Prn...  Hx of PSEUDOTUMOR CEREBRI (ICD-348.2) - prev followed by Deland Pretty and DrMartin at [place], plus DrLove in [place]... ~  Mar10: pt had eye check recently at [facility] and was told OK...  ANXIETY (ICD-300.00) - has used XANAX 0.5mg  as needed;  Also had Valium on her list but prefers the [employer] Rx.  ALOPECIA (ICD-704.00) & Seborrheic Dermatitis treated by DrJordan for Derm... ~  10/11: saw Derm DrJordan recently w/ seb dermatitis & given Doryx, Olux-E foam, Zinc shampoo, & Clobetasol shampoo   Past Surgical History  Procedure Date  . Cesarean section 1977  . Abscess drainage     abdominal    Outpatient Encounter Prescriptions as of 08/13/2012  Medication Sig Dispense Refill  . ALPRAZolam (XANAX) 0.5 MG tablet 1/2 - 1 tablet by mouth three times daily as needed for nerves  90 tablet  5  . candesartan (ATACAND) 32 MG tablet Take 1 tablet (32 mg total) by mouth daily.  90 tablet  3  . dicyclomine (BENTYL) 20 MG tablet TAKE 1 TABLET BY MOUTH 3 TIMES DAILY AS NEEDED FORABDOMINAL CRAMPING  90 tablet  0  . furosemide (LASIX) 40 MG tablet Take 1 tablet (40 mg total) by mouth daily.  90  tablet  3  . HYDROcodone-homatropine (HYDROMET) 5-1.5 MG/5ML syrup 1 tsp every 4-6 hrs as needed for cough  240 mL  0  . metroNIDAZOLE (FLAGYL) 250 MG tablet Take 1 tablet by mouth twice daily for 7 days.  14 tablet  0  . Probiotic Product (ALIGN) 4 MG CAPS Take 1 capsule by mouth daily.      . ranitidine (ZANTAC) 75 MG tablet Take 1-2 tablets as needed for stomach acid       . A/B OTIC otic solution PLACE 2 DROPS IN AFFECTEDEAR 3 TIMES  A DAY AS NEEDED  15 mL  2  . aspirin 81 MG tablet Take 81 mg by mouth daily.        . calcium carbonate (OS-CAL) 600 MG TABS Take 600 mg by mouth daily.        . Multiple Vitamin (MULTIVITAMIN) capsule Take 1 capsule by mouth daily.          Allergies  Allergen Reactions  . Avelox (Moxifloxacin Hcl In Nacl) Anaphylaxis    Pt c/o swelling of her throat and tongue  . Codeine     REACTION: causes her heart to race  . Crestor (Rosuvastatin Calcium)     Pt states INTOLERANT w/ muscle cramps  . Penicillins     REACTION: rash  . Pravastatin Sodium (Pravachol)     REACTION: intolerant= headache & insomnia  . Shellfish Allergy     Rash and itching  . Simvastatin     REACTION: intolerant= headache & insomnia    Current Medications, Allergies, Past Medical History, Past Surgical History, Family History, and Social History were reviewed in [employer] record.    Review of Systems         See HPI - all other systems neg except as noted... The patient complains of dyspnea on exertion.  The patient denies anorexia, fever, weight loss, weight gain, vision loss, decreased hearing, hoarseness, chest pain, syncope, peripheral edema, prolonged cough, headaches, hemoptysis, abdominal pain, melena, hematochezia, severe indigestion/heartburn, hematuria, incontinence, muscle weakness, suspicious skin lesions, transient blindness, difficulty walking, depression, unusual weight change, abnormal bleeding, enlarged lymph nodes, and angioedema.     Objective:   Physical Exam     WD, Obese, 56 y/o BF in NAD... GENERAL:  Alert & oriented; pleasant & cooperative. HEENT:  [place]/AT, EOM-wnl, PERRLA, EACs-clear, TMs-wnl, NOSE-clear, THROAT-clear & wnl. NECK:  Supple w/ fairROM; no JVD; normal carotid impulses w/o bruits; no thyromegaly or nodules palpated; no lymphadenopathy. CHEST:  Clear to P & A; without wheezes/ rales/ or rhonchi. HEART:  Regular Rhythm; without murmurs/ rubs/ or  gallops. ABDOMEN:  Obese, soft & nontender; + panniculus; normal bowel sounds; no organomegaly or masses detected. EXT:  mild arthritic deformities bilat knees; no varicose veins/ +venous insuffic/ tr edema. NEURO:  CN's intact;  no focal neuro deficits... DERM:  No lesions noted; no rash etc...  RADIOLOGY DATA:  Reviewed in the EPIC EMR & discussed w/ the patient...  LABORATORY DATA:  Reviewed in the EPIC EMR & discussed w/ the patient...   Assessment & Plan:    IBS>  She is c/o IBS symptoms but they seem rather mild & we discussed Rx w/ Metamucil daily as stool normalizer & BENTYL 20mg  Tid as needed for cramping; if symptoms worsen then she will f/u w/ GI, DrPatterson; she is under a lot of stress & this can exac the IBS, she has Alpraz for Prn use...   HBP>  Fair control on  her ARB/ Diuretic; needs better job w/ wt reduction, diet, etc & we discussed this...  HYPERLIPID>  She has been followed in the Lipid Clinic but was apparently unable to tol even 5mg  Crestor, and had to stop; on diet alone & f/u FLP looks somewhat better, continue diet efforts & get wt down...  DM, Borderline>  This has been well controlled on diet alone, min DM, but at risk for worsening glucose control & needs to lose the wt...  GI>  Hx dyspagia, IBS, etc...globus symptoms improved w/ prev Hydroxyzine, neg colon in 2006; she has mult somatic complaints...  LBP>  Stable on Tramadol & Robaxin... Needs to lose the weight...  Anxiety>  Under a lot of stress as noted & she prefers the Alpraz over the Valium; wants FMLA form filled out for work stress but doesn't have the form...   Patient's Medications  New Prescriptions   No medications on file  Previous Medications   ASPIRIN 81 MG TABLET    Take 81 mg by mouth daily.     CALCIUM CARBONATE (OS-CAL) 600 MG TABS    Take 600 mg by mouth daily.     METRONIDAZOLE (FLAGYL) 250 MG TABLET    Take 1 tablet by mouth twice daily for 7 days.   MULTIPLE VITAMIN  (MULTIVITAMIN) CAPSULE    Take 1 capsule by mouth daily.     PROBIOTIC PRODUCT (ALIGN) 4 MG CAPS    Take 1 capsule by mouth daily.   RANITIDINE (ZANTAC) 75 MG TABLET    Take 1-2 tablets as needed for stomach acid   Modified Medications   Modified Medication Previous Medication   ALPRAZOLAM (XANAX) 0.5 MG TABLET ALPRAZolam (XANAX) 0.5 MG tablet      1/2 - 1 tablet by mouth three times daily as needed for nerves    1/2 - 1 tablet by mouth three times daily as needed for nerves   ANTIPYRINE-BENZOCAINE (A/B OTIC) OTIC SOLUTION A/B OTIC otic solution      Place 2 drops in affected ear 3 times daily as needed    PLACE 2 DROPS IN AFFECTEDEAR 3 TIMES A DAY AS NEEDED   CANDESARTAN (ATACAND) 32 MG TABLET candesartan (ATACAND) 32 MG tablet      Take 1 tablet (32 mg total) by mouth daily.    Take 1 tablet (32 mg total) by mouth daily.   DICYCLOMINE (BENTYL) 20 MG TABLET dicyclomine (BENTYL) 20 MG tablet      Take 1 tablet (20 mg total) by mouth 3 (three) times daily as needed (abdominal cramping).    TAKE 1 TABLET BY MOUTH 3 TIMES DAILY AS NEEDED FORABDOMINAL CRAMPING   FUROSEMIDE (LASIX) 40 MG TABLET furosemide (LASIX) 40 MG tablet      Take 1 tablet (40 mg total) by mouth daily.    Take 1 tablet (40 mg total) by mouth daily.   HYDROCODONE-HOMATROPINE (HYDROMET) 5-1.5 MG/5ML SYRUP HYDROcodone-homatropine (HYDROMET) 5-1.5 MG/5ML syrup      1 tsp every 4-6 hrs as needed for cough    1 tsp every 4-6 hrs as needed for cough  Discontinued Medications   No medications on file

## 2012-08-13 NOTE — Patient Instructions (Addendum)
Today we updated your med list in our EPIC system...    Continue your current medications the same...    We refilled your meds per request...  Today we did your follow up FASTING blood work...    We will call you w/ the results when avail...  Let's get on track w/ our diet & exercise program...    The goal is to lose 10-15 lbs...  Call for any questions...  Let's continue our 6 month check ups.Marland KitchenMarland Kitchen

## 2012-08-14 LAB — VITAMIN D 25 HYDROXY (VIT D DEFICIENCY, FRACTURES): Vit D, 25-Hydroxy: 33 ng/mL (ref 30–89)

## 2012-08-15 LAB — TSH: TSH: 1.63 u[IU]/mL (ref 0.35–5.50)

## 2012-08-18 ENCOUNTER — Telehealth: Payer: Self-pay | Admitting: Pulmonary Disease

## 2012-08-18 ENCOUNTER — Other Ambulatory Visit: Payer: Self-pay | Admitting: Pulmonary Disease

## 2012-08-18 DIAGNOSIS — E785 Hyperlipidemia, unspecified: Secondary | ICD-10-CM

## 2012-08-18 NOTE — Telephone Encounter (Signed)
ATC line busy, WCB 

## 2012-08-18 NOTE — Telephone Encounter (Signed)
Pt states that since startling on new medications given by GI she is no better. She feels she needs to be seen and wants SN to see if he can get her in with Dr. Jarold Motto ASAP. I have called GI and Dr. Jarold Motto can see the pt on Tues., 08/19/12 @ 2:30pm. Pt is aware and of appt date4 and time.

## 2012-08-19 ENCOUNTER — Encounter: Payer: Self-pay | Admitting: Gastroenterology

## 2012-08-19 ENCOUNTER — Ambulatory Visit (INDEPENDENT_AMBULATORY_CARE_PROVIDER_SITE_OTHER): Payer: PRIVATE HEALTH INSURANCE | Admitting: Gastroenterology

## 2012-08-19 ENCOUNTER — Other Ambulatory Visit (INDEPENDENT_AMBULATORY_CARE_PROVIDER_SITE_OTHER): Payer: PRIVATE HEALTH INSURANCE

## 2012-08-19 VITALS — BP 140/86 | HR 72 | Ht 61.0 in | Wt 230.0 lb

## 2012-08-19 DIAGNOSIS — F411 Generalized anxiety disorder: Secondary | ICD-10-CM

## 2012-08-19 DIAGNOSIS — R1030 Lower abdominal pain, unspecified: Secondary | ICD-10-CM

## 2012-08-19 DIAGNOSIS — R109 Unspecified abdominal pain: Secondary | ICD-10-CM

## 2012-08-19 DIAGNOSIS — Z800 Family history of malignant neoplasm of digestive organs: Secondary | ICD-10-CM

## 2012-08-19 DIAGNOSIS — F419 Anxiety disorder, unspecified: Secondary | ICD-10-CM

## 2012-08-19 LAB — IBC PANEL
Iron: 40 ug/dL — ABNORMAL LOW (ref 42–145)
Saturation Ratios: 12.6 % — ABNORMAL LOW (ref 20.0–50.0)
Transferrin: 227.1 mg/dL (ref 212.0–360.0)

## 2012-08-19 LAB — VITAMIN B12: Vitamin B-12: 364 pg/mL (ref 211–911)

## 2012-08-19 LAB — C-REACTIVE PROTEIN: CRP: 0.6 mg/dL (ref 0.5–20.0)

## 2012-08-19 LAB — SEDIMENTATION RATE: Sed Rate: 38 mm/hr — ABNORMAL HIGH (ref 0–22)

## 2012-08-19 LAB — FOLATE: Folate: 18 ng/mL (ref >5.9)

## 2012-08-19 LAB — FERRITIN: Ferritin: 85.1 ng/mL (ref 10.0–291.0)

## 2012-08-19 MED ORDER — PEG-KCL-NACL-NASULF-NA ASC-C 100 G PO SOLR: 1.0000 | Freq: Once | ORAL | Status: DC

## 2012-08-19 MED ORDER — HYOSCYAMINE-PHENYLTOLOXAMINE 0.0625-15 MG PO CAPS: 1.0000 | ORAL_CAPSULE | Freq: Two times a day (BID) | ORAL | Status: DC

## 2012-08-19 NOTE — Progress Notes (Signed)
This is a 56 year old African American female with one month of left lower quadrant discomfort unresponsive to metronidazole and anti-spasmodic. She has a past history of pelvic adhesions, family history of colon cancer, and last colonoscopy in 2006. Her bowel pain is crampy, occurs frequently during the day, but is not associated with any change in bowel habits, melena or hematochezia. Several weeks ago she was examined by our nurse practitioner, and placed on been still 20 mg 3 times a day without improvement. Laboratory data was unremarkable. Stool exam for C. difficile was negative, and a trial of metronidazole 250 mg 3 times a day was of no benefit. She follows a regular diet and denies a specific food intolerances, anorexia, weight loss, upper GI or hepatobiliary complaints. She does use when necessary Zantac for mild acid reflux.  Before symptoms began she was on Advil 800 mg twice a day after tooth extraction. She may have possibly been on by mouth clindamycin. Other problems include obesity, multiple drug allergies, and chronic anxiety syndrome.  Current Medications, Allergies, Past Medical History, Past Surgical History, Family History and Social History were reviewed in [employer] record.  Pertinent Review of Systems Negative   Physical Exam: Healthy-appearing patient in no distress. Blood pressure 140/86, pulse 72 and regular, and weight 230 pounds with a BMI of 43.46. Cannot appreciate stigmata of chronic liver disease. Her chest is clear and she is in a regular rhythm without murmurs gallops or rubs. There is no organomegaly, abdominal masses or significant tenderness. Bowel sounds are normal. Peripheral extremities are unremarkable and mental status is normal.  Assessment: Probable pelvic-colonic adhesions from previous complications from a cesarean section years ago. I reviewed previous CT scan of the abdomen and pelvis which otherwise have been unremarkable. I've  ordered sedimentation rate and CRP, we'll schedule followup colonoscopy per her family history of colon cancer, and have changed from dicyclomine to Digex 1 tablet twice a day, high fiber diet, daily Metamucil. We will schedule colonoscopy exam with propofol and nurse anesthesia attendance. As per primary care notes, she has a lot of personal stress and is on regular Xanax 0.5 mg 3 times a day.    Assessment and Plan: No diagnosis found.

## 2012-08-19 NOTE — Patient Instructions (Signed)
Your physician has requested that you go to the basement for the following lab work before leaving today:Vitamin B12, Ferritin, Folate, CRP, Sed rate, IBC.  You have been scheduled for a colonoscopy with propofol. Please follow written instructions given to you at your visit today.  Please pick up your prep kit at the pharmacy within the next 1-3 days. If you use inhalers (even only as needed) or a CPAP machine, please bring them with you on the day of your procedure.  Stop taking Bentyl and start taking Digex samples one tablet by mouth twice daily. A prescription has been sent to your pharmacy.   Start taking over the counter Metamucil daily for and stay on a high fiber diet.  High-Fiber Diet Fiber is found in fruits, vegetables, and grains. A high-fiber diet encourages the addition of more whole grains, legumes, fruits, and vegetables in your diet. The recommended amount of fiber for adult males is 38 g per day. For adult females, it is 25 g per day. Pregnant and lactating women should get 28 g of fiber per day. If you have a digestive or bowel problem, ask your caregiver for advice before adding high-fiber foods to your diet. Eat a variety of high-fiber foods instead of only a select few type of foods.  PURPOSE  To increase stool bulk.  To make bowel movements more regular to prevent constipation.  To lower cholesterol.  To prevent overeating. WHEN IS THIS DIET USED?  It may be used if you have constipation and hemorrhoids.  It may be used if you have uncomplicated diverticulosis (intestine condition) and irritable bowel syndrome.  It may be used if you need help with weight management.  It may be used if you want to add it to your diet as a protective measure against atherosclerosis, diabetes, and cancer. SOURCES OF FIBER  Whole-grain breads and cereals.  Fruits, such as apples, oranges, bananas, berries, prunes, and pears.  Vegetables, such as green peas, carrots, sweet  potatoes, beets, broccoli, cabbage, spinach, and artichokes.  Legumes, such split peas, soy, lentils.  Almonds. FIBER CONTENT IN FOODS Starches and Grains / Dietary Fiber (g)  Cheerios, 1 cup / 3 g  Corn Flakes cereal, 1 cup / 0.7 g  Rice crispy treat cereal, 1 cup / 0.3 g  Instant oatmeal (cooked),  cup / 2 g  Frosted wheat cereal, 1 cup / 5.1 g  Brown, long-grain rice (cooked), 1 cup / 3.5 g  White, long-grain rice (cooked), 1 cup / 0.6 g  Enriched macaroni (cooked), 1 cup / 2.5 g Legumes / Dietary Fiber (g)  Baked beans (canned, plain, or vegetarian),  cup / 5.2 g  Kidney beans (canned),  cup / 6.8 g  Pinto beans (cooked),  cup / 5.5 g Breads and Crackers / Dietary Fiber (g)  Plain or honey graham crackers, 2 squares / 0.7 g  Saltine crackers, 3 squares / 0.3 g  Plain, salted pretzels, 10 pieces / 1.8 g  Whole-wheat bread, 1 slice / 1.9 g  White bread, 1 slice / 0.7 g  Raisin bread, 1 slice / 1.2 g  Plain bagel, 3 oz / 2 g  Flour tortilla, 1 oz / 0.9 g  Corn tortilla, 1 small / 1.5 g  Hamburger or hotdog bun, 1 small / 0.9 g Fruits / Dietary Fiber (g)  Apple with skin, 1 medium / 4.4 g  Sweetened applesauce,  cup / 1.5 g  Banana,  medium / 1.5 g  Grapes,  10 grapes / 0.4 g  Orange, 1 small / 2.3 g  Raisin, 1.5 oz / 1.6 g  Melon, 1 cup / 1.4 g Vegetables / Dietary Fiber (g)  Green beans (canned),  cup / 1.3 g  Carrots (cooked),  cup / 2.3 g  Broccoli (cooked),  cup / 2.8 g  Peas (cooked),  cup / 4.4 g  Mashed potatoes,  cup / 1.6 g  Lettuce, 1 cup / 0.5 g  Corn (canned),  cup / 1.6 g  Tomato,  cup / 1.1 g Document Released: 10/08/2005 Document Revised: 04/08/2012 Document Reviewed: 01/10/2012 [facility] Patient Information 2013 [place], [place].

## 2012-08-20 ENCOUNTER — Encounter: Payer: Self-pay | Admitting: Gastroenterology

## 2012-08-22 ENCOUNTER — Encounter: Payer: Self-pay | Admitting: Gastroenterology

## 2012-08-22 ENCOUNTER — Ambulatory Visit (AMBULATORY_SURGERY_CENTER): Payer: PRIVATE HEALTH INSURANCE | Admitting: Gastroenterology

## 2012-08-22 ENCOUNTER — Ambulatory Visit: Payer: PRIVATE HEALTH INSURANCE | Admitting: Pharmacist

## 2012-08-22 VITALS — BP 144/74 | HR 66 | Temp 96.8°F | Resp 20 | Ht 61.0 in | Wt 230.0 lb

## 2012-08-22 DIAGNOSIS — D126 Benign neoplasm of colon, unspecified: Secondary | ICD-10-CM

## 2012-08-22 DIAGNOSIS — R109 Unspecified abdominal pain: Secondary | ICD-10-CM

## 2012-08-22 DIAGNOSIS — Z1211 Encounter for screening for malignant neoplasm of colon: Secondary | ICD-10-CM

## 2012-08-22 MED ORDER — SODIUM CHLORIDE 0.9 % IV SOLN: 500.0000 mL | INTRAVENOUS | Status: DC

## 2012-08-22 NOTE — Progress Notes (Signed)
Patient did not experience any of the following events: a burn prior to discharge; a fall within the facility; wrong site/side/patient/procedure/implant event; or a hospital transfer or hospital admission upon discharge from the facility. ([id number]) Patient did not have preoperative order for IV antibiotic SSI prophylaxis. ([id number])  

## 2012-08-22 NOTE — Progress Notes (Signed)
1457 - To recovery, VSS. Report to [place], Charity fundraiser

## 2012-08-22 NOTE — Patient Instructions (Addendum)
YOU HAD AN ENDOSCOPIC PROCEDURE TODAY AT THE [facility]: Refer to the procedure report that was given to you for any specific questions about what was found during the examination.  If the procedure report does not answer your questions, please call your gastroenterologist to clarify.  If you requested that your care partner not be given the details of your procedure findings, then the procedure report has been included in a sealed envelope for you to review at your convenience later.  YOU SHOULD EXPECT: Some feelings of bloating in the abdomen. Passage of more gas than usual.  Walking can help get rid of the air that was put into your GI tract during the procedure and reduce the bloating. If you had a lower endoscopy (such as a colonoscopy or flexible sigmoidoscopy) you may notice spotting of blood in your stool or on the toilet paper. If you underwent a bowel prep for your procedure, then you may not have a normal bowel movement for a few days.  DIET: Your first meal following the procedure should be a light meal and then it is ok to progress to your normal diet.  A half-sandwich or bowl of soup is an example of a good first meal.  Heavy or fried foods are harder to digest and may make you feel nauseous or bloated.  Likewise meals heavy in dairy and vegetables can cause extra gas to form and this can also increase the bloating.  Drink plenty of fluids but you should avoid alcoholic beverages for 24 hours.  ACTIVITY: Your care partner should take you home directly after the procedure.  You should plan to take it easy, moving slowly for the rest of the day.  You can resume normal activity the day after the procedure however you should NOT DRIVE or use heavy machinery for 24 hours (because of the sedation medicines used during the test).    SYMPTOMS TO REPORT IMMEDIATELY: A gastroenterologist can be reached at any hour.  During normal business hours, 8:30 AM to 5:00 PM Monday through Friday,  call [phone].  After hours and on weekends, please call the GI answering service at [phone] who will take a message and have the physician on call contact you.   Following lower endoscopy (colonoscopy or flexible sigmoidoscopy):  Excessive amounts of blood in the stool  Significant tenderness or worsening of abdominal pains  Swelling of the abdomen that is new, acute  Fever of 100F or higher  Following upper endoscopy (EGD)  Vomiting of blood or coffee ground material  New chest pain or pain under the shoulder blades  Painful or persistently difficult swallowing  New shortness of breath  Fever of 100F or higher  Black, tarry-looking stools  FOLLOW UP: If any biopsies were taken you will be contacted by phone or by letter within the next 1-3 weeks.  Call your gastroenterologist if you have not heard about the biopsies in 3 weeks.  Our staff will call the home number listed on your records the next business day following your procedure to check on you and address any questions or concerns that you may have at that time regarding the information given to you following your procedure. This is a courtesy call and so if there is no answer at the home number and we have not heard from you through the emergency physician on call, we will assume that you have returned to your regular daily activities without incident.  SIGNATURES/CONFIDENTIALITY: You and/or your care   partner have signed paperwork which will be entered into your electronic medical record.  These signatures attest to the fact that that the information above on your After Visit Summary has been reviewed and is understood.  Full responsibility of the confidentiality of this discharge information lies with you and/or your care-partner.  

## 2012-08-22 NOTE — Op Note (Signed)
[facility] [address] [place], [address]   COLONOSCOPY PROCEDURE REPORT  PATIENT: Susan [name], Susan [name]  MR#: [id number] BIRTHDATE: [birth year] , 56  yrs. old GENDER: Female ENDOSCOPIST: Mardella Layman, MD, Clementeen Graham REFERRED BY:  Alroy Dust, M.D. PROCEDURE DATE:  08/22/2012 PROCEDURE:   Colonoscopy with biopsy ASA CLASS:   Class II INDICATIONS:average risk patient for colon cancer and abdominal pain in the lower left quadrant. MEDICATIONS: Propofol (Diprivan) 270 mg IV  DESCRIPTION OF PROCEDURE:   After the risks and benefits and of the procedure were explained, informed consent was obtained.  A digital rectal exam revealed no abnormalities of the rectum.    The LB [id number]  endoscope was introduced through the anus and advanced to the cecum, which was identified by both the appendix and ileocecal valve .  The quality of the prep was excellent, using MoviPrep .  The instrument was then slowly withdrawn as the colon was fully examined.     COLON FINDINGS: A normal appearing cecum, ileocecal valve, and appendiceal orifice were identified.  The ascending, hepatic flexure, transverse, splenic flexure, descending, sigmoid colon and rectum appeared unremarkable.  No polyps or cancers were seen.   A diminutive flat polyp was found in the ascending colon.  A biopsy was performed using cold forceps.     Retroflexed views revealed no abnormalities.     The scope was then withdrawn from the patient and the procedure completed.  COMPLICATIONS: There were no complications. ENDOSCOPIC IMPRESSION: 1.   Normal colon..probable pelvic adhesions 2.   Diminutive flat polyp was found in the ascending colon; biopsy was performed using hot forceps; biopsy was performed using cold forceps  RECOMMENDATIONS: 1.  Repeat colonoscopy in 5 years if polyp adenomatous; otherwise 10 years 2.  Await biopsy results 3.  Continue current medications   REPEAT  EXAM:  cc:  _______________________________ eSignedMardella Layman, MD, [facility] 08/22/2012 2:58 PM

## 2012-08-25 ENCOUNTER — Telehealth: Payer: Self-pay | Admitting: *Deleted

## 2012-08-25 NOTE — Telephone Encounter (Signed)
  Follow up Call-  Call back number 08/22/2012  Post procedure Call Back phone  # [phone]  Permission to leave phone message Yes     Patient questions:  Do you have a fever, pain , or abdominal swelling? no Pain Score  0 *  Have you tolerated food without any problems? yes  Have you been able to return to your normal activities? yes  Do you have any questions about your discharge instructions: Diet   no Medications  no Follow up visit  no  Do you have questions or concerns about your Care? no  Actions: * If pain score is 4 or above: No action needed, pain <4.  Pt states she has red itchy eyes since sat am and questioned if from the sedation.  Pt instructed to call her pcp to see if can be seen or eye drops called in. ewm

## 2012-08-26 ENCOUNTER — Ambulatory Visit (INDEPENDENT_AMBULATORY_CARE_PROVIDER_SITE_OTHER): Payer: PRIVATE HEALTH INSURANCE | Admitting: Pharmacist

## 2012-08-26 ENCOUNTER — Encounter: Payer: Self-pay | Admitting: Pharmacist

## 2012-08-26 VITALS — Wt 230.8 lb

## 2012-08-26 DIAGNOSIS — E785 Hyperlipidemia, unspecified: Secondary | ICD-10-CM

## 2012-08-26 NOTE — Assessment & Plan Note (Signed)
Pt's cholesterol worse with lifestyle management than when on statin.  TC- 255 (goal<200), TG- 91 (goal<150), HDL- 74 (goal>45), LDL- 169 (goal<130).  LFTs are WNL.  Note- changed LDL goal since pt's A1c at goal with no medications.  Framingham risk<10%.  Pt willing to retry medication.  Will start with fenofibrate in hopes of greatest LDL reduction.  Pt agreeable.  Will also try to incorporate more high fiber foods into her diet as well as start a regular exercise routine.  Will follow up in 2 months.

## 2012-08-26 NOTE — Progress Notes (Signed)
HPI  Susan [name] is a 56 yo F who was referred to the Lipid clinic by Dr. Kriste Basque.  She has previously been seen in the clinic, but did not return for follow up after having problems with Crestor 5mg .  She has not been on any cholesterol medications in >1 year and has tried to control her labs with diet and exercise alone.  She has past intolerances to most statins including simvastatin, pravastatin and rosuvastatin.  Reviewed family history- noncontributory.  Both parents have DM but no history of CAD or CVA.  She is not a smoker and does not drink alcohol. 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    Review of dietary habits shows patient does fairly  well at maintaining a healthy diet.  Her breakfast is usually around 12-1 pm and may be oatmeal, [place] bacon, honey wheat toast, eggs and orange juice.  Her meal prior to work is grilled chicken, [employer] sandwich, hot dogs, and occasionally K and W. She does not eat any pork or beef.  She will eat one meal at work, which may be a salad or fish or chicken.  She drinks unsweet tea, coffee and water.  She likes to snack on peanut M and M's.    Pt does not exercise on a regular basis.  She odes have a treadmill and exercise bike at home.   Current Outpatient Prescriptions  Medication Sig Dispense Refill  . ALPRAZolam (XANAX) 0.5 MG tablet 1/2 - 1 tablet by mouth three times daily as needed for nerves  90 tablet  5  . candesartan (ATACAND) 32 MG tablet Take 1 tablet (32 mg total) by mouth daily.  90 tablet  3  . furosemide (LASIX) 40 MG tablet Take 1 tablet (40 mg total) by mouth daily.  90 tablet  3  . Hyoscyamine-Phenyltoloxamine (DIGEX NF) 1.9147-82 MG CAPS Take 1 capsule by mouth 2 (two) times daily.  60 each  5  . ranitidine (ZANTAC) 75 MG tablet Take 1-2 tablets as needed for stomach acid         Allergies  Allergen Reactions  . Avelox (Moxifloxacin Hcl In Nacl) Anaphylaxis    Pt c/o swelling of her  throat and tongue  . Codeine     REACTION: causes her heart to race  . Crestor (Rosuvastatin Calcium)     Pt states INTOLERANT w/ muscle cramps  . Penicillins     REACTION: rash  . Pravastatin Sodium (Pravachol)     REACTION: intolerant= headache & insomnia  . Shellfish Allergy     Rash and itching  . Simvastatin     REACTION: intolerant= headache & insomnia

## 2012-08-26 NOTE — Patient Instructions (Addendum)
Start Trilipix 135mg  once daily.   With your diet:  1.  Try to eat more oatmeal for breakfast  2.  Eat more fruits and vegetables  3.  Try to cut down on bread  Set a goal to exercise 15 minutes 3-5 days of the week.   Recheck labs in 2 months.

## 2012-08-27 ENCOUNTER — Encounter: Payer: Self-pay | Admitting: Gastroenterology

## 2012-08-27 ENCOUNTER — Encounter: Payer: PRIVATE HEALTH INSURANCE | Admitting: Gastroenterology

## 2012-11-07 ENCOUNTER — Encounter: Payer: Self-pay | Admitting: Pulmonary Disease

## 2012-11-27 ENCOUNTER — Telehealth: Payer: Self-pay | Admitting: Pulmonary Disease

## 2012-11-27 MED ORDER — AZITHROMYCIN 250 MG PO TABS: ORAL_TABLET | ORAL | Status: DC

## 2012-11-27 NOTE — Telephone Encounter (Signed)
I spoke with pt- C/O nasal congestion, sore throat, dry cough, bilateral ear pain, facial pressure, HA x Tuesday. No f/c/s/n/v. She is requesting to have something called in for this. Please advise SN thanks Last OV 08/13/12 Pending 02/12/13 Allergies  Allergen Reactions  . Avelox (Moxifloxacin Hcl In Nacl) Anaphylaxis    Pt c/o swelling of her throat and tongue  . Codeine     REACTION: causes her heart to race  . Crestor (Rosuvastatin Calcium)     Pt states INTOLERANT w/ muscle cramps  . Penicillins     REACTION: rash  . Pravastatin Sodium (Pravachol)     REACTION: intolerant= headache & insomnia  . Shellfish Allergy     Rash and itching  . Simvastatin     REACTION: intolerant= headache & insomnia

## 2012-11-27 NOTE — Telephone Encounter (Signed)
Per SN----ok to call in zpak #1  Take as directed and give 2 refills.  i called and spoke with pt and she is aware. Nothing further is needed.

## 2012-12-03 ENCOUNTER — Telehealth: Payer: Self-pay | Admitting: Pulmonary Disease

## 2012-12-03 MED ORDER — CHOLINE FENOFIBRATE 135 MG PO CPDR: 135.0000 mg | DELAYED_RELEASE_CAPSULE | Freq: Every day | ORAL | Status: DC

## 2012-12-03 NOTE — Telephone Encounter (Signed)
Per SN----we have no samples here in our office of the trilipix.    Does she want generic fenofibrate?  thanks

## 2012-12-03 NOTE — Telephone Encounter (Signed)
I spoke with pt. She stated she has been on trilipx 135 mg since November by the lipid clinic. This is not on her medicine list. She states she called the lipid clinc for samples and they did not have any. Pt calling and requesting samples from [place]. Please advise SN thanks

## 2012-12-03 NOTE — Telephone Encounter (Signed)
i SPOKE WITH PT. She is requesting generic. [facility] has been sent for 90 day supply

## 2012-12-11 ENCOUNTER — Other Ambulatory Visit: Payer: Self-pay | Admitting: Pulmonary Disease

## 2012-12-11 MED ORDER — FENOFIBRATE 160 MG PO TABS: 160.0000 mg | ORAL_TABLET | Freq: Every day | ORAL | Status: DC

## 2013-01-08 ENCOUNTER — Other Ambulatory Visit: Payer: Self-pay | Admitting: Cardiology

## 2013-01-08 DIAGNOSIS — E785 Hyperlipidemia, unspecified: Secondary | ICD-10-CM

## 2013-01-08 NOTE — Progress Notes (Signed)
Patient called to make appointment and ask for blood work. Reports she has been off of lipid medications for 1-1.5 months. Will get lab work next week and come to clinic to discuss medication therapy

## 2013-01-13 ENCOUNTER — Encounter: Payer: Self-pay | Admitting: Pharmacist

## 2013-01-14 ENCOUNTER — Other Ambulatory Visit (INDEPENDENT_AMBULATORY_CARE_PROVIDER_SITE_OTHER): Payer: PRIVATE HEALTH INSURANCE

## 2013-01-14 DIAGNOSIS — E785 Hyperlipidemia, unspecified: Secondary | ICD-10-CM

## 2013-01-14 LAB — LIPID PANEL
Cholesterol: 203 mg/dL — ABNORMAL HIGH (ref 0–200)
HDL: 73.7 mg/dL (ref >39.00)
Total CHOL/HDL Ratio: 3
Triglycerides: 77 mg/dL (ref 0.0–149.0)
VLDL: 15.4 mg/dL (ref 0.0–40.0)

## 2013-01-14 LAB — HEPATIC FUNCTION PANEL
ALT: 12 U/L (ref 0–35)
AST: 16 U/L (ref 0–37)
Albumin: 3.7 g/dL (ref 3.5–5.2)
Alkaline Phosphatase: 77 U/L (ref 39–117)
Bilirubin, Direct: 0 mg/dL (ref 0.0–0.3)
Total Bilirubin: 0.3 mg/dL (ref 0.3–1.2)
Total Protein: 7.1 g/dL (ref 6.0–8.3)

## 2013-01-14 LAB — LDL CHOLESTEROL, DIRECT: Direct LDL: 117.3 mg/dL

## 2013-01-16 ENCOUNTER — Ambulatory Visit (INDEPENDENT_AMBULATORY_CARE_PROVIDER_SITE_OTHER): Payer: PRIVATE HEALTH INSURANCE | Admitting: Pharmacist

## 2013-01-16 DIAGNOSIS — E785 Hyperlipidemia, unspecified: Secondary | ICD-10-CM

## 2013-01-16 NOTE — Patient Instructions (Addendum)
Your cholesterol is great.  Continue to try to eat lots of vegetables.  Set a goal to exercise at least 3-4 days per week.  Follow up with Dr. Kriste Basque.

## 2013-01-16 NOTE — Progress Notes (Signed)
HPI  Susan [name] is a 57 yo F who was referred to the Lipid clinic by Dr. Kriste Basque.  She is here for a follow up appt.  At last visit, we started fenofibrate.  Pt states she took this for a few months but stopped because she was afraid it might be hurting her liver.  She states she is doing well except for some stomach pains.  These are not new pains and she has plans to see Dr. Kriste Basque next month.  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  She is getting ready to change her work hours in order to have more waking hours during the day.   Review of dietary habits shows patient does fairly  well at maintaining a healthy diet.  She has cut back on fried foods, bread, cheese and dairy because it upsets her stomach.    Pt does not exercise on a regular basis.  She does have a treadmill and exercise bike at home.   Current Outpatient Prescriptions  Medication Sig Dispense Refill  . ALPRAZolam (XANAX) 0.5 MG tablet 1/2 - 1 tablet by mouth three times daily as needed for nerves  90 tablet  5  . candesartan (ATACAND) 32 MG tablet Take 1 tablet (32 mg total) by mouth daily.  90 tablet  3  . furosemide (LASIX) 40 MG tablet Take 1 tablet (40 mg total) by mouth daily.  90 tablet  3  . ranitidine (ZANTAC) 75 MG tablet Take 1-2 tablets as needed for stomach acid       . Hyoscyamine-Phenyltoloxamine (DIGEX NF) 3.6644-03 MG CAPS Take 1 capsule by mouth 2 (two) times daily.  60 each  5   No current facility-administered medications for this visit.    Allergies  Allergen Reactions  . Avelox (Moxifloxacin Hcl In Nacl) Anaphylaxis    Pt c/o swelling of her throat and tongue  . Codeine     REACTION: causes her heart to race  . Crestor (Rosuvastatin Calcium)     Pt states INTOLERANT w/ muscle cramps  . Penicillins     REACTION: rash  . Pravastatin Sodium (Pravachol)     REACTION: intolerant= headache & insomnia  . Shellfish Allergy     Rash and itching  .  Simvastatin     REACTION: intolerant= headache & insomnia

## 2013-01-16 NOTE — Assessment & Plan Note (Signed)
Pt's cholesterol at goal with no medication.  TC- 203, TG- 77, HDL- 74, LDL- 117.  LFTs are WNL.  Will have pt continue managing cholesterol with lifestyle modifications and follow up with Dr. Kriste Basque.  If she has any problems in the future, will be glad to see her again.

## 2013-01-21 ENCOUNTER — Ambulatory Visit (INDEPENDENT_AMBULATORY_CARE_PROVIDER_SITE_OTHER): Payer: PRIVATE HEALTH INSURANCE | Admitting: Psychiatry

## 2013-01-21 ENCOUNTER — Encounter (HOSPITAL_COMMUNITY): Payer: Self-pay | Admitting: Psychiatry

## 2013-01-21 DIAGNOSIS — F411 Generalized anxiety disorder: Secondary | ICD-10-CM

## 2013-01-21 DIAGNOSIS — F329 Major depressive disorder, single episode, unspecified: Secondary | ICD-10-CM

## 2013-01-29 ENCOUNTER — Ambulatory Visit (INDEPENDENT_AMBULATORY_CARE_PROVIDER_SITE_OTHER): Payer: PRIVATE HEALTH INSURANCE | Admitting: Pulmonary Disease

## 2013-01-29 ENCOUNTER — Encounter: Payer: Self-pay | Admitting: Pulmonary Disease

## 2013-01-29 VITALS — BP 128/80 | HR 65 | Temp 98.5°F | Ht 61.0 in | Wt 232.8 lb

## 2013-01-29 DIAGNOSIS — L659 Nonscarring hair loss, unspecified: Secondary | ICD-10-CM

## 2013-01-29 DIAGNOSIS — I10 Essential (primary) hypertension: Secondary | ICD-10-CM

## 2013-01-29 DIAGNOSIS — K589 Irritable bowel syndrome without diarrhea: Secondary | ICD-10-CM

## 2013-01-29 DIAGNOSIS — F411 Generalized anxiety disorder: Secondary | ICD-10-CM

## 2013-01-29 DIAGNOSIS — I872 Venous insufficiency (chronic) (peripheral): Secondary | ICD-10-CM

## 2013-01-29 DIAGNOSIS — M47817 Spondylosis without myelopathy or radiculopathy, lumbosacral region: Secondary | ICD-10-CM

## 2013-01-29 DIAGNOSIS — R1319 Other dysphagia: Secondary | ICD-10-CM

## 2013-01-29 DIAGNOSIS — R7309 Other abnormal glucose: Secondary | ICD-10-CM

## 2013-01-29 DIAGNOSIS — E785 Hyperlipidemia, unspecified: Secondary | ICD-10-CM

## 2013-01-29 MED ORDER — ALPRAZOLAM 0.5 MG PO TABS: ORAL_TABLET | ORAL | Status: DC

## 2013-01-29 MED ORDER — CANDESARTAN CILEXETIL 32 MG PO TABS: 32.0000 mg | ORAL_TABLET | Freq: Every day | ORAL | Status: DC

## 2013-01-29 MED ORDER — FUROSEMIDE 40 MG PO TABS: 40.0000 mg | ORAL_TABLET | Freq: Every day | ORAL | Status: DC
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
## 2013-01-29 NOTE — Patient Instructions (Addendum)
Today we updated your med list in our EPIC system...    Continue your current medications the same...    We refilled your meds per request...  For your continued abd discomfort>>    Please be diligent w/ DrPatterson's recommendation for:       DIGEX tabs one tab twice daily...       High fiber diet...       Metamucil daily...       Take the Xanax (Alprazolam) 0.5mg  three times daily...  Keep the appt that DrTaavon set up w/ DrWoodruff for your Urologic symptoms...  Call for any questions...  Let's plan a follow up visit in 44mo w/ FASTING blood work, sooner if needed for problems.Marland KitchenMarland Kitchen

## 2013-01-30 ENCOUNTER — Telehealth: Payer: Self-pay | Admitting: Gastroenterology
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
## 2013-01-30 NOTE — Patient Instructions (Signed)
Discussed orally 

## 2013-01-30 NOTE — Telephone Encounter (Signed)
Transferred patient to medical records. Looking for a second opinion somewhere else.

## 2013-02-05 ENCOUNTER — Ambulatory Visit (INDEPENDENT_AMBULATORY_CARE_PROVIDER_SITE_OTHER): Payer: Self-pay | Admitting: Psychiatry

## 2013-02-05 DIAGNOSIS — F329 Major depressive disorder, single episode, unspecified: Secondary | ICD-10-CM

## 2013-02-05 DIAGNOSIS — F411 Generalized anxiety disorder: Secondary | ICD-10-CM
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
## 2013-02-05 NOTE — Patient Instructions (Signed)
Discussed orally 

## 2013-02-12 ENCOUNTER — Ambulatory Visit: Payer: PRIVATE HEALTH INSURANCE | Admitting: Pulmonary Disease

## 2013-02-19 ENCOUNTER — Ambulatory Visit (INDEPENDENT_AMBULATORY_CARE_PROVIDER_SITE_OTHER): Payer: Self-pay | Admitting: Psychiatry

## 2013-02-19 DIAGNOSIS — F3289 Other specified depressive episodes: Secondary | ICD-10-CM

## 2013-02-19 DIAGNOSIS — F329 Major depressive disorder, single episode, unspecified: Secondary | ICD-10-CM

## 2013-02-19 DIAGNOSIS — F411 Generalized anxiety disorder: Secondary | ICD-10-CM
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
## 2013-02-20 NOTE — Patient Instructions (Signed)
Discussed orally 

## 2013-03-04 ENCOUNTER — Ambulatory Visit (INDEPENDENT_AMBULATORY_CARE_PROVIDER_SITE_OTHER): Payer: Self-pay | Admitting: Psychiatry

## 2013-03-04 DIAGNOSIS — F411 Generalized anxiety disorder: Secondary | ICD-10-CM

## 2013-03-04 DIAGNOSIS — F329 Major depressive disorder, single episode, unspecified: Secondary | ICD-10-CM
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
## 2013-03-06 NOTE — Patient Instructions (Signed)
Discussed orally 

## 2013-03-11 ENCOUNTER — Other Ambulatory Visit: Payer: Self-pay | Admitting: Gastroenterology

## 2013-03-11 DIAGNOSIS — R1011 Right upper quadrant pain: Secondary | ICD-10-CM

## 2013-03-11 DIAGNOSIS — R1013 Epigastric pain: Secondary | ICD-10-CM

## 2013-03-11 DIAGNOSIS — R1012 Left upper quadrant pain: Secondary | ICD-10-CM

## 2013-03-18 ENCOUNTER — Ambulatory Visit (HOSPITAL_COMMUNITY): Payer: PRIVATE HEALTH INSURANCE

## 2013-03-19 ENCOUNTER — Encounter (HOSPITAL_COMMUNITY): Payer: Self-pay

## 2013-03-19 ENCOUNTER — Ambulatory Visit (HOSPITAL_COMMUNITY)
Admission: RE | Admit: 2013-03-19 | Discharge: 2013-03-19 | Disposition: A | Discharge status: Home / Self Care | Payer: 59 | Source: Ambulatory Visit | Attending: Gastroenterology | Admitting: Gastroenterology

## 2013-03-19 DIAGNOSIS — R1011 Right upper quadrant pain: Secondary | ICD-10-CM

## 2013-03-19 DIAGNOSIS — R935 Abnormal findings on diagnostic imaging of other abdominal regions, including retroperitoneum: Secondary | ICD-10-CM | POA: Insufficient documentation

## 2013-03-19 DIAGNOSIS — R110 Nausea: Secondary | ICD-10-CM | POA: Insufficient documentation

## 2013-03-19 DIAGNOSIS — R142 Eructation: Secondary | ICD-10-CM | POA: Insufficient documentation

## 2013-03-19 DIAGNOSIS — R1013 Epigastric pain: Secondary | ICD-10-CM

## 2013-03-19 DIAGNOSIS — R141 Gas pain: Secondary | ICD-10-CM | POA: Insufficient documentation

## 2013-03-19 DIAGNOSIS — R109 Unspecified abdominal pain: Secondary | ICD-10-CM | POA: Insufficient documentation

## 2013-03-19 DIAGNOSIS — R1012 Left upper quadrant pain: Secondary | ICD-10-CM

## 2013-03-19 MED ORDER — IOHEXOL 300 MG/ML  SOLN
100.0000 mL | Freq: Once | INTRAMUSCULAR | Status: AC | PRN
  Administered 2013-03-19: 100 mL via INTRAVENOUS

## 2013-04-02 ENCOUNTER — Ambulatory Visit (HOSPITAL_COMMUNITY): Payer: Self-pay | Admitting: Psychiatry

## 2013-04-03 ENCOUNTER — Encounter: Payer: Self-pay | Admitting: Pulmonary Disease

## 2013-04-16 ENCOUNTER — Ambulatory Visit (INDEPENDENT_AMBULATORY_CARE_PROVIDER_SITE_OTHER): Payer: Self-pay | Admitting: Psychiatry

## 2013-04-16 DIAGNOSIS — F411 Generalized anxiety disorder: Secondary | ICD-10-CM

## 2013-04-16 DIAGNOSIS — F419 Anxiety disorder, unspecified: Secondary | ICD-10-CM

## 2013-04-16 DIAGNOSIS — F329 Major depressive disorder, single episode, unspecified: Secondary | ICD-10-CM

## 2013-04-16 NOTE — Patient Instructions (Addendum)
Use plan your day handouts   Do relaxation breathing 4 times per day    Resume self-care efforts regarding nutrition and exercise within your capability
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
## 2013-04-30 ENCOUNTER — Ambulatory Visit (HOSPITAL_COMMUNITY): Payer: Self-pay | Admitting: Psychiatry

## 2013-05-07 ENCOUNTER — Ambulatory Visit (INDEPENDENT_AMBULATORY_CARE_PROVIDER_SITE_OTHER): Payer: Self-pay | Admitting: Psychiatry

## 2013-05-07 DIAGNOSIS — F419 Anxiety disorder, unspecified: Secondary | ICD-10-CM

## 2013-05-07 DIAGNOSIS — F411 Generalized anxiety disorder: Secondary | ICD-10-CM

## 2013-05-07 DIAGNOSIS — F329 Major depressive disorder, single episode, unspecified: Secondary | ICD-10-CM

## 2013-05-07 NOTE — Patient Instructions (Signed)
Discussed orally 
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
## 2013-05-12 ENCOUNTER — Encounter: Payer: Self-pay | Admitting: Pulmonary Disease

## 2013-05-19 ENCOUNTER — Other Ambulatory Visit: Payer: Self-pay | Admitting: Pulmonary Disease

## 2013-05-21 ENCOUNTER — Telehealth (HOSPITAL_COMMUNITY): Payer: Self-pay | Admitting: Psychiatry

## 2013-05-22 ENCOUNTER — Telehealth (HOSPITAL_COMMUNITY): Payer: Self-pay | Admitting: Psychiatry

## 2013-05-22 NOTE — Telephone Encounter (Signed)
Therapist returned patient's call. Patient requested letter regarding her condition and participation in treatment. Therapist requested patient contact office with name and address of recipient of letter. Patient and therapist will discuss and review letter at next scheduled appointment. Patient will sign releases.

## 2013-05-27 ENCOUNTER — Ambulatory Visit (INDEPENDENT_AMBULATORY_CARE_PROVIDER_SITE_OTHER): Payer: Self-pay | Admitting: Psychiatry

## 2013-05-27 DIAGNOSIS — F419 Anxiety disorder, unspecified: Secondary | ICD-10-CM

## 2013-05-27 DIAGNOSIS — F411 Generalized anxiety disorder: Secondary | ICD-10-CM

## 2013-05-27 DIAGNOSIS — F329 Major depressive disorder, single episode, unspecified: Secondary | ICD-10-CM

## 2013-05-28 ENCOUNTER — Ambulatory Visit (HOSPITAL_COMMUNITY): Payer: Self-pay | Admitting: Psychiatry

## 2013-05-28 NOTE — Patient Instructions (Signed)
Discussed orally 
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
## 2013-08-03 ENCOUNTER — Other Ambulatory Visit: Payer: Self-pay | Admitting: Sports Medicine

## 2013-08-03 DIAGNOSIS — M549 Dorsalgia, unspecified: Secondary | ICD-10-CM

## 2013-08-05 ENCOUNTER — Ambulatory Visit (INDEPENDENT_AMBULATORY_CARE_PROVIDER_SITE_OTHER): Payer: 59 | Admitting: Pulmonary Disease

## 2013-08-05 ENCOUNTER — Encounter: Payer: Self-pay | Admitting: Pulmonary Disease

## 2013-08-05 VITALS — BP 138/84 | HR 64 | Temp 98.4°F | Ht 61.0 in | Wt 233.2 lb

## 2013-08-05 DIAGNOSIS — R1319 Other dysphagia: Secondary | ICD-10-CM

## 2013-08-05 DIAGNOSIS — I10 Essential (primary) hypertension: Secondary | ICD-10-CM

## 2013-08-05 DIAGNOSIS — I872 Venous insufficiency (chronic) (peripheral): Secondary | ICD-10-CM

## 2013-08-05 DIAGNOSIS — M47817 Spondylosis without myelopathy or radiculopathy, lumbosacral region: Secondary | ICD-10-CM

## 2013-08-05 DIAGNOSIS — R7309 Other abnormal glucose: Secondary | ICD-10-CM

## 2013-08-05 DIAGNOSIS — K589 Irritable bowel syndrome without diarrhea: Secondary | ICD-10-CM

## 2013-08-05 DIAGNOSIS — F411 Generalized anxiety disorder: Secondary | ICD-10-CM

## 2013-08-05 DIAGNOSIS — E785 Hyperlipidemia, unspecified: Secondary | ICD-10-CM

## 2013-08-05 DIAGNOSIS — L659 Nonscarring hair loss, unspecified: Secondary | ICD-10-CM

## 2013-08-05 MED ORDER — ALPRAZOLAM 0.5 MG PO TABS: ORAL_TABLET | ORAL | Status: DC

## 2013-08-05 MED ORDER — FUROSEMIDE 40 MG PO TABS: 40.0000 mg | ORAL_TABLET | Freq: Every day | ORAL | Status: DC

## 2013-08-05 MED ORDER — CANDESARTAN CILEXETIL 32 MG PO TABS: 32.0000 mg | ORAL_TABLET | Freq: Every day | ORAL | Status: DC

## 2013-08-05 NOTE — Patient Instructions (Signed)
Today we updated your med list in our EPIC system...    Continue your current medications the same...    We refilled your meds per request...  Call [facility] surg, Dr. Wenda Low at [phone] regarding their BARIATRIC Surgery program for obesity  Continue your diet & exercise program...  Call for any questions...  Let's plan a follow up visit in 12mo w/ FASTING blood work at that time.
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
## 2013-08-09 ENCOUNTER — Other Ambulatory Visit: Payer: Self-pay

## 2013-08-21 ENCOUNTER — Ambulatory Visit
Admission: RE | Admit: 2013-08-21 | Discharge: 2013-08-21 | Disposition: A | Discharge status: Home / Self Care | Payer: Worker's Compensation | Source: Ambulatory Visit | Attending: Sports Medicine | Admitting: Sports Medicine

## 2013-08-21 DIAGNOSIS — M549 Dorsalgia, unspecified: Secondary | ICD-10-CM

## 2013-08-22 ENCOUNTER — Other Ambulatory Visit: Payer: Self-pay

## 2013-08-26 ENCOUNTER — Other Ambulatory Visit: Payer: Self-pay

## 2013-08-30 ENCOUNTER — Ambulatory Visit
Admission: RE | Admit: 2013-08-30 | Discharge: 2013-08-30 | Disposition: A | Discharge status: Home / Self Care | Payer: Worker's Compensation | Source: Ambulatory Visit | Attending: Sports Medicine | Admitting: Sports Medicine

## 2013-09-02 ENCOUNTER — Encounter: Payer: Self-pay | Admitting: Podiatry

## 2013-09-02 ENCOUNTER — Ambulatory Visit (INDEPENDENT_AMBULATORY_CARE_PROVIDER_SITE_OTHER): Payer: 59 | Admitting: Podiatry

## 2013-09-02 VITALS — BP 143/89 | HR 65 | Ht 61.0 in | Wt 228.0 lb

## 2013-09-02 DIAGNOSIS — M21969 Unspecified acquired deformity of unspecified lower leg: Secondary | ICD-10-CM

## 2013-09-02 DIAGNOSIS — M79672 Pain in left foot: Secondary | ICD-10-CM | POA: Insufficient documentation

## 2013-09-02 DIAGNOSIS — Q828 Other specified congenital malformations of skin: Secondary | ICD-10-CM

## 2013-09-02 DIAGNOSIS — M79609 Pain in unspecified limb: Secondary | ICD-10-CM

## 2013-09-02 MED ORDER — ARCH BANDAGE MISC: 2.0000 | Freq: Once | Status: DC

## 2013-09-02 NOTE — Patient Instructions (Addendum)
Seen for painful lesion under the left foot. Callus debrided.  Has unstable foot and need Orthotics. Metatarsal binder dispensed to stabilize the mid foot.  Wear existing orthotics for about a month and return.

## 2013-09-02 NOTE — Progress Notes (Signed)
Left foot lesion hurts can't bear weight x 3 weeks.  Also her leg cramps. 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 She has her Orthotics but has not been wearing them.  Objective: Porokeratosis under 5th MPJ left foot. Hypermobile first ray bilateral.  Assessment: Painful porokeratosis sub 5 left. Unstable medial column and hypermobile first ray bilateral.  Plan: Debrided plantar painful lesion. Dispensed Metatarsal binder x 2 to support midfoot. Patient is to wear Orthotics and return after one month.

## 2013-09-11 ENCOUNTER — Other Ambulatory Visit: Payer: Self-pay | Admitting: Sports Medicine

## 2013-09-11 DIAGNOSIS — G96198 Other disorders of meninges, not elsewhere classified: Secondary | ICD-10-CM

## 2013-09-11 DIAGNOSIS — M545 Low back pain, unspecified: Secondary | ICD-10-CM

## 2013-09-14 ENCOUNTER — Ambulatory Visit
Admission: RE | Admit: 2013-09-14 | Discharge: 2013-09-14 | Disposition: A | Discharge status: Home / Self Care | Payer: Worker's Compensation | Source: Ambulatory Visit | Attending: Sports Medicine | Admitting: Sports Medicine

## 2013-09-14 DIAGNOSIS — M545 Low back pain, unspecified: Secondary | ICD-10-CM

## 2013-09-14 DIAGNOSIS — G96198 Other disorders of meninges, not elsewhere classified: Secondary | ICD-10-CM

## 2013-09-14 MED ORDER — IOHEXOL 180 MG/ML  SOLN
1.0000 mL | Freq: Once | INTRAMUSCULAR | Status: AC | PRN
  Administered 2013-09-14: 1 mL via EPIDURAL

## 2013-09-14 MED ORDER — METHYLPREDNISOLONE ACETATE 40 MG/ML INJ SUSP (RADIOLOG
120.0000 mg | Freq: Once | INTRAMUSCULAR | Status: AC
  Administered 2013-09-14: 120 mg via EPIDURAL

## 2013-10-02 ENCOUNTER — Ambulatory Visit: Payer: 59 | Admitting: Podiatry

## 2013-11-24 ENCOUNTER — Other Ambulatory Visit: Payer: Self-pay | Admitting: *Deleted

## 2013-11-24 MED ORDER — ALPRAZOLAM 0.5 MG PO TABS: ORAL_TABLET | ORAL | Status: DC

## 2013-11-28 ENCOUNTER — Other Ambulatory Visit: Payer: Self-pay | Admitting: Pulmonary Disease

## 2013-12-31 ENCOUNTER — Telehealth: Payer: Self-pay | Admitting: Pulmonary Disease

## 2013-12-31 DIAGNOSIS — F411 Generalized anxiety disorder: Secondary | ICD-10-CM

## 2013-12-31 DIAGNOSIS — K589 Irritable bowel syndrome without diarrhea: Secondary | ICD-10-CM

## 2013-12-31 DIAGNOSIS — E785 Hyperlipidemia, unspecified: Secondary | ICD-10-CM

## 2013-12-31 DIAGNOSIS — I10 Essential (primary) hypertension: Secondary | ICD-10-CM

## 2013-12-31 NOTE — Telephone Encounter (Signed)
Called and spoke with pt and she is going to call primary care and set up with new primary care doctor.    Pt wanted to check with SN about the atacand that she is taking.  She stated that her insurance company sent her a letter and was concerned about her taking the atacand since this can affect your potassium.   Pt is wanting SN recs on this.  Pt is aware that SN is out of the office until Monday and we will check with SN and let her know his recs.  SN please advise on the atacand and if this should be changed for the pt.  thanks

## 2014-01-04 NOTE — Telephone Encounter (Signed)
Per SN---  It is no different than losartan, benicar etc when it comes to potassium.   Pt will need to return for fasting labs this week--order has been placed in the computer for the pt.   Now if the insurance company wants a cheaper medicine then we can change her rx.  What is her co-pay?

## 2014-01-04 NOTE — Telephone Encounter (Signed)
Pt aware. Nothing further needed 

## 2014-01-08 ENCOUNTER — Other Ambulatory Visit (INDEPENDENT_AMBULATORY_CARE_PROVIDER_SITE_OTHER): Payer: Worker's Compensation

## 2014-01-08 DIAGNOSIS — K589 Irritable bowel syndrome without diarrhea: Secondary | ICD-10-CM

## 2014-01-08 DIAGNOSIS — I10 Essential (primary) hypertension: Secondary | ICD-10-CM

## 2014-01-08 DIAGNOSIS — E785 Hyperlipidemia, unspecified: Secondary | ICD-10-CM

## 2014-01-08 DIAGNOSIS — F411 Generalized anxiety disorder: Secondary | ICD-10-CM

## 2014-01-08 LAB — BASIC METABOLIC PANEL
BUN: 14 mg/dL (ref 6–23)
CO2: 27 mEq/L (ref 19–32)
Calcium: 9.4 mg/dL (ref 8.4–10.5)
Chloride: 106 mEq/L (ref 96–112)
Creatinine, Ser: 0.6 mg/dL (ref 0.4–1.2)
GFR: 122.09 mL/min (ref >60.00)
Glucose, Bld: 92 mg/dL (ref 70–99)
Potassium: 4.1 mEq/L (ref 3.5–5.1)
Sodium: 140 mEq/L (ref 135–145)

## 2014-01-08 LAB — HEPATIC FUNCTION PANEL
ALT: 12 U/L (ref 0–35)
AST: 13 U/L (ref 0–37)
Albumin: 3.9 g/dL (ref 3.5–5.2)
Alkaline Phosphatase: 81 U/L (ref 39–117)
Bilirubin, Direct: 0 mg/dL (ref 0.0–0.3)
Total Bilirubin: 0.5 mg/dL (ref 0.3–1.2)
Total Protein: 7.4 g/dL (ref 6.0–8.3)

## 2014-01-08 LAB — LIPID PANEL
Cholesterol: 246 mg/dL — ABNORMAL HIGH (ref 0–200)
HDL: 88.6 mg/dL (ref >39.00)
LDL Cholesterol: 145 mg/dL — ABNORMAL HIGH (ref 0–99)
Total CHOL/HDL Ratio: 3
Triglycerides: 63 mg/dL (ref 0.0–149.0)
VLDL: 12.6 mg/dL (ref 0.0–40.0)

## 2014-01-08 LAB — CBC WITH DIFFERENTIAL/PLATELET
Basophils Absolute: 0.1 10*3/uL (ref 0.0–0.1)
Basophils Relative: 1.4 % (ref 0.0–3.0)
Eosinophils Absolute: 0 10*3/uL (ref 0.0–0.7)
Eosinophils Relative: 0.4 % (ref 0.0–5.0)
HCT: 38.4 % (ref 36.0–46.0)
Hemoglobin: 12.8 g/dL (ref 12.0–15.0)
Lymphocytes Relative: 31 % (ref 12.0–46.0)
Lymphs Abs: 2.8 10*3/uL (ref 0.7–4.0)
MCHC: 33.3 g/dL (ref 30.0–36.0)
MCV: 91.3 fl (ref 78.0–100.0)
Monocytes Absolute: 0.7 10*3/uL (ref 0.1–1.0)
Monocytes Relative: 7.4 % (ref 3.0–12.0)
Neutro Abs: 5.3 10*3/uL (ref 1.4–7.7)
Neutrophils Relative %: 59.8 % (ref 43.0–77.0)
Platelets: 359 10*3/uL (ref 150.0–400.0)
RBC: 4.21 Mil/uL (ref 3.87–5.11)
RDW: 14.4 % (ref 11.5–14.6)
WBC: 8.9 10*3/uL (ref 4.5–10.5)

## 2014-01-08 LAB — TSH: TSH: 0.98 u[IU]/mL (ref 0.35–5.50)

## 2014-02-03 ENCOUNTER — Ambulatory Visit: Payer: Self-pay | Admitting: Pulmonary Disease

## 2014-02-05 ENCOUNTER — Encounter: Payer: Self-pay | Admitting: Internal Medicine

## 2014-02-05 ENCOUNTER — Ambulatory Visit (INDEPENDENT_AMBULATORY_CARE_PROVIDER_SITE_OTHER): Payer: 59 | Admitting: Internal Medicine

## 2014-02-05 VITALS — BP 130/82 | HR 97 | Temp 98.5°F | Ht 61.0 in | Wt 229.4 lb

## 2014-02-05 DIAGNOSIS — I10 Essential (primary) hypertension: Secondary | ICD-10-CM

## 2014-02-05 DIAGNOSIS — Z1239 Encounter for other screening for malignant neoplasm of breast: Secondary | ICD-10-CM

## 2014-02-05 DIAGNOSIS — Z0000 Encounter for general adult medical examination without abnormal findings: Secondary | ICD-10-CM

## 2014-02-05 DIAGNOSIS — E785 Hyperlipidemia, unspecified: Secondary | ICD-10-CM

## 2014-02-05 DIAGNOSIS — Z23 Encounter for immunization: Secondary | ICD-10-CM

## 2014-02-05 MED ORDER — FENOFIBRATE 160 MG PO TABS: 160.0000 mg | ORAL_TABLET | Freq: Every day | ORAL | Status: DC

## 2014-02-05 NOTE — Assessment & Plan Note (Signed)
Intol of statins Followed with lipid clinic Prev on fenofibrate but indep stopped when provider on maternity leave Reviewed upward trend Encouraged to resume same and follow up with lipid clinic

## 2014-02-05 NOTE — Assessment & Plan Note (Signed)
Wt Readings from Last 3 Encounters:  02/05/14 229 lb 6.4 oz (104.055 kg)  09/02/13 228 lb (103.42 kg)  08/05/13 233 lb 3.2 oz (105.779 kg)   The patient is asked to make an attempt to improve diet and exercise patterns to aid in medical management of this problem.

## 2014-02-05 NOTE — Patient Instructions (Addendum)
It was good to see you today.  We have reviewed your prior records including labs and tests today  Health Maintenance reviewed - Tdap (tetanus, diphtheria and pertussis) updated today -all other recommended immunizations and age-appropriate screenings are up-to-date.  we'll make referral to [facility] for your mammogram . Our office will contact you regarding appointment(s) once made.  Medications reviewed and updated, resume fenofibrate for cholesterol and follow up with Gay Filler at lipid clinic - no other changes recommended at this time.  Please schedule followup in 6 months for semiannual exam and labs, call sooner if problems.  Health Maintenance, Female A healthy lifestyle and preventative care can promote health and wellness.  Maintain regular health, dental, and eye exams.  Eat a healthy diet. Foods like vegetables, fruits, whole grains, low-fat dairy products, and lean protein foods contain the nutrients you need without too many calories. Decrease your intake of foods high in solid fats, added sugars, and salt. Get information about a proper diet from your caregiver, if necessary.  Regular physical exercise is one of the most important things you can do for your health. Most adults should get at least 150 minutes of moderate-intensity exercise (any activity that increases your heart rate and causes you to sweat) each week. In addition, most adults need muscle-strengthening exercises on 2 or more days a week.   Maintain a healthy weight. The body mass index (BMI) is a screening tool to identify possible weight problems. It provides an estimate of body fat based on height and weight. Your caregiver can help determine your BMI, and can help you achieve or maintain a healthy weight. For adults 20 years and older:  A BMI below 18.5 is considered underweight.  A BMI of 18.5 to 24.9 is normal.  A BMI of 25 to 29.9 is considered overweight.  A BMI of 30 and above is considered  obese.  Maintain normal blood lipids and cholesterol by exercising and minimizing your intake of saturated fat. Eat a balanced diet with plenty of fruits and vegetables. Blood tests for lipids and cholesterol should begin at age 71 and be repeated every 5 years. If your lipid or cholesterol levels are high, you are over 50, or you are a high risk for heart disease, you may need your cholesterol levels checked more frequently.Ongoing high lipid and cholesterol levels should be treated with medicines if diet and exercise are not effective.  If you smoke, find out from your caregiver how to quit. If you do not use tobacco, do not start.  Lung cancer screening is recommended for adults aged 56 80 years who are at high risk for developing lung cancer because of a history of smoking. Yearly low-dose computed tomography (CT) is recommended for people who have at least a 30-pack-year history of smoking and are a current smoker or have quit within the past 15 years. A pack year of smoking is smoking an average of 1 pack of cigarettes a day for 1 year (for example: 1 pack a day for 30 years or 2 packs a day for 15 years). Yearly screening should continue until the smoker has stopped smoking for at least 15 years. Yearly screening should also be stopped for people who develop a health problem that would prevent them from having lung cancer treatment.  If you are pregnant, do not drink alcohol. If you are breastfeeding, be very cautious about drinking alcohol. If you are not pregnant and choose to drink alcohol, do not exceed 1 drink per  day. One drink is considered to be 12 ounces (355 mL) of beer, 5 ounces (148 mL) of wine, or 1.5 ounces (44 mL) of liquor.  Avoid use of street drugs. Do not share needles with anyone. Ask for help if you need support or instructions about stopping the use of drugs.  High blood pressure causes heart disease and increases the risk of stroke. Blood pressure should be checked at least  every 1 to 2 years. Ongoing high blood pressure should be treated with medicines, if weight loss and exercise are not effective.  If you are 23 to 59 years old, ask your caregiver if you should take aspirin to prevent strokes.  Diabetes screening involves taking a blood sample to check your fasting blood sugar level. This should be done once every 3 years, after age 7, if you are within normal weight and without risk factors for diabetes. Testing should be considered at a younger age or be carried out more frequently if you are overweight and have at least 1 risk factor for diabetes.  Breast cancer screening is essential preventative care for women. You should practice "breast self-awareness." This means understanding the normal appearance and feel of your breasts and may include breast self-examination. Any changes detected, no matter how small, should be reported to a caregiver. Women in their 26s and 30s should have a clinical breast exam (CBE) by a caregiver as part of a regular health exam every 1 to 3 years. After age 47, women should have a CBE every year. Starting at age 89, women should consider having a mammogram (breast X-ray) every year. Women who have a family history of breast cancer should talk to their caregiver about genetic screening. Women at a high risk of breast cancer should talk to their caregiver about having an MRI and a mammogram every year.  Breast cancer gene (BRCA)-related cancer risk assessment is recommended for women who have family members with BRCA-related cancers. BRCA-related cancers include breast, ovarian, tubal, and peritoneal cancers. Having family members with these cancers may be associated with an increased risk for harmful changes (mutations) in the breast cancer genes BRCA1 and BRCA2. Results of the assessment will determine the need for genetic counseling and BRCA1 and BRCA2 testing.  The Pap test is a screening test for cervical cancer. Women should have a  Pap test starting at age 10. Between ages 51 and 33, Pap tests should be repeated every 2 years. Beginning at age 45, you should have a Pap test every 3 years as long as the past 3 Pap tests have been normal. If you had a hysterectomy for a problem that was not cancer or a condition that could lead to cancer, then you no longer need Pap tests. If you are between ages 72 and 9, and you have had normal Pap tests going back 10 years, you no longer need Pap tests. If you have had past treatment for cervical cancer or a condition that could lead to cancer, you need Pap tests and screening for cancer for at least 20 years after your treatment. If Pap tests have been discontinued, risk factors (such as a new sexual partner) need to be reassessed to determine if screening should be resumed. Some women have medical problems that increase the chance of getting cervical cancer. In these cases, your caregiver may recommend more frequent screening and Pap tests.  The human papillomavirus (HPV) test is an additional test that may be used for cervical cancer screening. The HPV  test looks for the virus that can cause the cell changes on the cervix. The cells collected during the Pap test can be tested for HPV. The HPV test could be used to screen women aged 43 years and older, and should be used in women of any age who have unclear Pap test results. After the age of 67, women should have HPV testing at the same frequency as a Pap test.  Colorectal cancer can be detected and often prevented. Most routine colorectal cancer screening begins at the age of 51 and continues through age 52. However, your caregiver may recommend screening at an earlier age if you have risk factors for colon cancer. On a yearly basis, your caregiver may provide home test kits to check for hidden blood in the stool. Use of a small camera at the end of a tube, to directly examine the colon (sigmoidoscopy or colonoscopy), can detect the earliest forms of  colorectal cancer. Talk to your caregiver about this at age 64, when routine screening begins. Direct examination of the colon should be repeated every 5 to 10 years through age 79, unless early forms of pre-cancerous polyps or small growths are found.  Hepatitis C blood testing is recommended for all people born from [birth year] through 1966 and any individual with known risks for hepatitis C.  Practice safe sex. Use condoms and avoid high-risk sexual practices to reduce the spread of sexually transmitted infections (STIs). Sexually active women aged 87 and younger should be checked for Chlamydia, which is a common sexually transmitted infection. Older women with new or multiple partners should also be tested for Chlamydia. Testing for other STIs is recommended if you are sexually active and at increased risk.  Osteoporosis is a disease in which the bones lose minerals and strength with aging. This can result in serious bone fractures. The risk of osteoporosis can be identified using a bone density scan. Women ages 72 and over and women at risk for fractures or osteoporosis should discuss screening with their caregivers. Ask your caregiver whether you should be taking a calcium supplement or vitamin D to reduce the rate of osteoporosis.  Menopause can be associated with physical symptoms and risks. Hormone replacement therapy is available to decrease symptoms and risks. You should talk to your caregiver about whether hormone replacement therapy is right for you.  Use sunscreen. Apply sunscreen liberally and repeatedly throughout the day. You should seek shade when your shadow is shorter than you. Protect yourself by wearing long sleeves, pants, a wide-brimmed hat, and sunglasses year round, whenever you are outdoors.  Notify your caregiver of new moles or changes in moles, especially if there is a change in shape or color. Also notify your caregiver if a mole is larger than the size of a pencil eraser.  Stay  current with your immunizations. Document Released: 04/23/2011 Document Revised: 02/02/2013 Document Reviewed: 04/23/2011 [facility] Patient Information 2014 [employer].

## 2014-02-05 NOTE — Progress Notes (Signed)
Subjective:    Patient ID: Susan [name], female    DOB: 1956/02/25, 58 y.o.   MRN: [id number]  HPI  Transfer to me from Susan [name] - patient is here today for annual physical. Patient feels well overall  Also reviewed chronic medical issues and interval medical events: MO working on diet/exercise, HTN, dyslipidemia  Past Medical History  Diagnosis Date  . HTN (hypertension)   . Borderline diabetes   . Morbid obesity   . Irritable bowel syndrome   . Lumbosacral spondylosis without myelopathy     improved with PT  . Benign intracranial hypertension   . Anxiety state, unspecified   . HLD (hyperlipidemia)     intol of statins - follows with lipid clinic  . Anemia    Family History  Problem Relation Age of Onset  . Diabetes Father     on dialysis  . Colon cancer Maternal Grandmother   . Breast cancer Mother   . Diabetes Mother   . Kidney failure Father   . Hypertension Mother    History  Substance Use Topics  . Smoking status: Never Smoker   . Smokeless tobacco: Never Used  . Alcohol Use: No    Review of Systems  Constitutional: Negative for fatigue and unexpected weight change.  Respiratory: Negative for cough, shortness of breath and wheezing.   Cardiovascular: Negative for chest pain, palpitations and leg swelling.  Gastrointestinal: Negative for nausea, abdominal pain and diarrhea.  Neurological: Negative for dizziness, weakness, light-headedness and headaches.  Psychiatric/Behavioral: Negative for dysphoric mood. The patient is not nervous/anxious.   All other systems reviewed and are negative.      Objective:   Physical Exam  BP 130/82  Pulse 97  Temp(Src) 98.5 F (36.9 C) (Oral)  Ht 5\' 1"  (1.549 m)  Wt 229 lb 6.4 oz (104.055 kg)  BMI 43.37 kg/m2  SpO2 96% Wt Readings from Last 3 Encounters:  02/05/14 229 lb 6.4 oz (104.055 kg)  09/02/13 228 lb (103.42 kg)  08/05/13 233 lb 3.2 oz (105.779 kg)   Constitutional: She is MO, but appears  well-developed and well-nourished. No distress.  HENT: Head: Normocephalic and atraumatic. Ears: B TMs ok, no erythema or effusion; Nose: Nose normal. Mouth/Throat: Oropharynx is clear and moist. No oropharyngeal exudate.  Eyes: Conjunctivae and EOM are normal. Pupils are equal, round, and reactive to light. No scleral icterus.  Neck: Normal range of motion. Neck supple. No JVD present. No thyromegaly present.  Cardiovascular: Normal rate, regular rhythm and normal heart sounds.  No murmur heard. No BLE edema. Pulmonary/Chest: Effort normal and breath sounds normal. No respiratory distress. She has no wheezes.  Abdominal: Soft. Bowel sounds are normal. She exhibits no distension. There is no tenderness. no masses Musculoskeletal: Normal range of motion, no joint effusions. No gross deformities Neurological: She is alert and oriented to person, place, and time. No cranial nerve deficit. Coordination, balance, strength, speech and gait are normal.  Skin: Skin is warm and dry. No rash noted. No erythema.  Psychiatric: She has a normal mood and affect. Her behavior is normal. Judgment and thought content normal.    Lab Results  Component Value Date   WBC 8.9 01/08/2014   HGB 12.8 01/08/2014   HCT 38.4 01/08/2014   PLT 359.0 01/08/2014   GLUCOSE 92 01/08/2014   CHOL 246* 01/08/2014   TRIG 63.0 01/08/2014   HDL 88.60 01/08/2014   LDLDIRECT 117.3 01/14/2013   LDLCALC 145* 01/08/2014   ALT 12 01/08/2014  AST 13 01/08/2014   NA 140 01/08/2014   K 4.1 01/08/2014   CL 106 01/08/2014   CREATININE 0.6 01/08/2014   BUN 14 01/08/2014   CO2 27 01/08/2014   TSH 0.98 01/08/2014   HGBA1C 6.1 08/13/2012    Dg Facet Jt Inj L /s Single Level Right W/fl/ct  09/14/2013   CLINICAL DATA:  Right L5-S1 facet arthropathy with synovial cyst resulting in right lower extremity radiculitis.  EXAM: DG FACET INJECTION  IMPRESSION: Technically successful right L5-S1 facet injection #1.  PROCEDURE: The procedure, risks, benefits,  and alternatives were explained to the patient. Questions regarding the procedure were encouraged and answered. The patient understands and consents to the procedure.  Right L5-S1 FACET INJECTION: A posterior oblique approach was taken to the facet on the right at L5-S1 using a curved 22 gauge spinal needle. Intra-articular positioning was confirmed by injecting a small amount of Omnipaque 180. No vascular opacification is seen. 120 mg of Depo-Medrol mixed with 1.5 mL 1% lidocaine were instilled into the joint. The injection resulted in concordant pain. The procedure was well-tolerated.   Electronically Signed   By: Lawrence Santiago M.D.   On: 09/14/2013 09:34       Assessment & Plan:   CPX/v70.0 - Patient has been counseled on age-appropriate routine health concerns for screening and prevention. These are reviewed and up-to-date. Immunizations are up-to-date or declined. Labs reviewed.  Problem List Items Addressed This Visit   HYPERLIPIDEMIA     Intol of statins Followed with lipid clinic Prev on fenofibrate but indep stopped when provider on maternity leave Reviewed upward trend Encouraged to resume same and follow up with lipid clinic    Relevant Medications      fenofibrate tablet   HYPERTENSION      BP Readings from Last 3 Encounters:  02/05/14 130/82  09/14/13 144/86  09/02/13 143/89   The current medical regimen is effective;  continue present plan and medications.     Relevant Medications      fenofibrate tablet   MORBID OBESITY      Wt Readings from Last 3 Encounters:  02/05/14 229 lb 6.4 oz (104.055 kg)  09/02/13 228 lb (103.42 kg)  08/05/13 233 lb 3.2 oz (105.779 kg)   The patient is asked to make an attempt to improve diet and exercise patterns to aid in medical management of this problem.      Other Visit Diagnoses   Routine general medical examination at a health care facility    -  Primary    Other screening breast examination        Relevant Orders       MM  DIGITAL SCREENING BILATERAL

## 2014-02-05 NOTE — Assessment & Plan Note (Signed)
BP Readings from Last 3 Encounters:  02/05/14 130/82  09/14/13 144/86  09/02/13 143/89   The current medical regimen is effective;  continue present plan and medications.

## 2014-02-08 ENCOUNTER — Telehealth: Payer: Self-pay | Admitting: Internal Medicine

## 2014-02-08 NOTE — Telephone Encounter (Signed)
Relevant patient education assigned to patient using Emmi. ° °

## 2014-03-11 ENCOUNTER — Ambulatory Visit
Admission: RE | Admit: 2014-03-11 | Discharge: 2014-03-11 | Disposition: A | Discharge status: Home / Self Care | Payer: 59 | Source: Ambulatory Visit | Attending: Internal Medicine | Admitting: Internal Medicine

## 2014-03-11 DIAGNOSIS — Z1239 Encounter for other screening for malignant neoplasm of breast: Secondary | ICD-10-CM

## 2014-03-20 ENCOUNTER — Other Ambulatory Visit: Payer: Self-pay | Admitting: Pulmonary Disease

## 2014-03-22 ENCOUNTER — Telehealth: Payer: Self-pay | Admitting: Internal Medicine

## 2014-03-22 MED ORDER — FUROSEMIDE 40 MG PO TABS: 40.0000 mg | ORAL_TABLET | Freq: Every day | ORAL | Status: DC

## 2014-03-22 NOTE — Telephone Encounter (Signed)
Pt request refill for laxis 40 mg 90 days supply to be send tin [facility]. Pt is trying to get it done today before she going of town. Please advise.

## 2014-03-22 NOTE — Telephone Encounter (Signed)
Notified pt rx sent to pharmacy../[facility] 

## 2014-06-10 ENCOUNTER — Other Ambulatory Visit: Payer: Self-pay

## 2014-06-10 ENCOUNTER — Telehealth: Payer: Self-pay | Admitting: Pulmonary Disease

## 2014-06-10 ENCOUNTER — Telehealth: Payer: Self-pay | Admitting: Internal Medicine

## 2014-06-10 MED ORDER — CANDESARTAN CILEXETIL 32 MG PO TABS: 32.0000 mg | ORAL_TABLET | Freq: Every day | ORAL | Status: DC

## 2014-06-10 NOTE — Telephone Encounter (Signed)
Called and spoke with pt and she was given the number to Dr. Charlett Blake to see if she can set up new primary care with her.  Pt to call back for further recs.

## 2014-06-10 NOTE — Telephone Encounter (Signed)
Patient is calling to request a refill on her candesartan (ATACAND) 32 MG tablet. She wants it sent to [facility]. Please advise.

## 2014-06-10 NOTE — Telephone Encounter (Signed)
Pt called back and stated we need to [facility]. Pt stated we [facility] and that's not where she requested, please fix this asap, she only got 12 pills left.

## 2014-06-17 ENCOUNTER — Other Ambulatory Visit: Payer: Self-pay

## 2014-06-17 MED ORDER — CANDESARTAN CILEXETIL 32 MG PO TABS: 32.0000 mg | ORAL_TABLET | Freq: Every day | ORAL | Status: DC

## 2014-06-17 NOTE — Telephone Encounter (Signed)
Patient called back to check the status of this script.  Patient only has four pills left.  Patient is requesting a small script to be called in to [facility] on [address] to get her through until her script comes in.

## 2014-06-18 NOTE — Telephone Encounter (Signed)
Called in rx to [facility] on [address].  Called pt and informed rx were called in.  Pt stated that she is picking up from the [facility] pharmacy that still had the rx from the first one sent.

## 2014-06-18 NOTE — Telephone Encounter (Signed)
It appears this has been done 8/27 - please check with Stefannie on same -then let pt know (both [facility] and [facility] [address]) thanks

## 2014-06-21 ENCOUNTER — Ambulatory Visit (INDEPENDENT_AMBULATORY_CARE_PROVIDER_SITE_OTHER): Payer: 59 | Admitting: Pulmonary Disease

## 2014-06-21 ENCOUNTER — Encounter: Payer: Self-pay | Admitting: Pulmonary Disease

## 2014-06-21 ENCOUNTER — Ambulatory Visit (INDEPENDENT_AMBULATORY_CARE_PROVIDER_SITE_OTHER)
Admission: RE | Admit: 2014-06-21 | Discharge: 2014-06-21 | Disposition: A | Discharge status: Home / Self Care | Payer: 59 | Source: Ambulatory Visit | Attending: Pulmonary Disease | Admitting: Pulmonary Disease

## 2014-06-21 VITALS — BP 158/88 | HR 84 | Temp 98.0°F | Ht 61.0 in | Wt 235.2 lb

## 2014-06-21 DIAGNOSIS — B9689 Other specified bacterial agents as the cause of diseases classified elsewhere: Secondary | ICD-10-CM | POA: Insufficient documentation

## 2014-06-21 DIAGNOSIS — I872 Venous insufficiency (chronic) (peripheral): Secondary | ICD-10-CM

## 2014-06-21 DIAGNOSIS — M47817 Spondylosis without myelopathy or radiculopathy, lumbosacral region: Secondary | ICD-10-CM

## 2014-06-21 DIAGNOSIS — I10 Essential (primary) hypertension: Secondary | ICD-10-CM

## 2014-06-21 DIAGNOSIS — J019 Acute sinusitis, unspecified: Secondary | ICD-10-CM | POA: Insufficient documentation

## 2014-06-21 DIAGNOSIS — E785 Hyperlipidemia, unspecified: Secondary | ICD-10-CM

## 2014-06-21 DIAGNOSIS — J209 Acute bronchitis, unspecified: Secondary | ICD-10-CM

## 2014-06-21 DIAGNOSIS — K589 Irritable bowel syndrome without diarrhea: Secondary | ICD-10-CM

## 2014-06-21 DIAGNOSIS — J018 Other acute sinusitis: Secondary | ICD-10-CM

## 2014-06-21 DIAGNOSIS — R7309 Other abnormal glucose: Secondary | ICD-10-CM

## 2014-06-21 DIAGNOSIS — F411 Generalized anxiety disorder: Secondary | ICD-10-CM

## 2014-06-21 MED ORDER — LEVOFLOXACIN 500 MG PO TABS: 500.0000 mg | ORAL_TABLET | Freq: Every day | ORAL | Status: DC

## 2014-06-21 MED ORDER — METHYLPREDNISOLONE (PAK) 4 MG PO TABS: ORAL_TABLET | ORAL | Status: DC

## 2014-06-21 NOTE — Patient Instructions (Addendum)
Today we updated your med list in our EPIC system...    Continue your current medications the same...  We decided to change the Clindamycin given by your dentist, to [facility] 500mg  take one tab daily til gone...  We also wrote for a MEDROL Dosepak to take as directed on the pack starting today (for the nasal congestion & wheezing)...  Be sure to get the OTC PROBIOTIC called ALIGN & take one daily while you are on the Levaquin...  You should also take ACTIVIA Yogurt daily...   If the mucus is thick & hard to drain- use the OTC MUCINEX 600mg - 2 tabs twice daily w/ lots of fluids...  Today we did a follow up CXR...    We will contact you w/ the results when available...   Call for any questions or if we can be of service in any way.Marland KitchenMarland Kitchen

## 2014-06-21 NOTE — Progress Notes (Signed)
Subjective:    Patient ID: Susan [name], female    DOB: [birth year], 58 y.o.   MRN: [id number]  HPI 58 y/o BF here for a yearly follow up visit... she has mult medical problems including: HBP, Chol, CWP, HA's w/ pseudotumor cerebri, and anxiety...   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  We discussed Rx w/ Metamucil for the diarrhea & stool normalization, and BENTYL 2026mid as needed for the abd cramping pain... See prob list below>>  ~  February 05, 2012:  254mo14mo & add-on appt because she needed the FMLA form for post office completed according to her directions (she refused vital signs this visit)> we reviewed the form partialy completed by [employer] & went thru it item by item today> she ok'd each item & stated this FMLA form is needed because of her GI issues- specifically her intermittent Dyspagia & globus symptom & her lower abd cramping & diarrhea related to her IBS; she has Alpraz 0,5mg 28m for the former symptom and Metamucil/ Bentyl for the latter symptoms... On the FMLA form item #7 was of primary importance to her:  She states episodic flair ups of her condition would be expected to occur 2 times per month & last approx 2-3 days w/ each occurrence (& require her to be out of work for these flair ups)...  FMLA form completed & faxed at her request...  ~  August 13, 2012:  54mo R72mo her CC is abd discomfort> eval  by PGuenther (DrPatSports coache has IBS treated w/ Bentyl etc...     HBP> on Atacand32 & Lasix40; BP=128/76 & she denies recent CP, palpit, SOB, ch in edema...    CP> on ASA81 (recently held by GI); she is too sedentary & asked to incr walking etc...    VI> she knows to avoid sodium & takes Lasix40;     Chol> on diet alone but numbers are poor w/ TChol 255, TG 91, HDL 74, LDL 169; states she is INTOL to all statins & [facility] but refuses [facility] clinic return or med trial; obviously she needs to do much better on diet...    DM> on diet alone; BS=97, A1c=6.1 and doing well but must lose wt & we discussed diet + exercise...    Obese> wt=229# up 4# from prev & BMI>40    GI> HxGlobus sensation, IBS +FamHx colon ca> treated w/ Bentyl20Tid prn, Zantac75Bid, Align...    LBP> uses OTC analgesics as needed; followed by DrKramer...    Pseudotumor cerebri> see notes below...    Anxiety> on Xanax as needed & feels this works well... We reviewed prob list, meds, xrays and labs> see below for updates >> she declines Flu shot...  LABS 10/13:  FLP- not at goals ondiet alone w/ TChol=255 LDL=169;  Chems- wnl;  CBC- wnl;  TSH=1.63;  VitD=33  ~  January 29, 2013:  940moROV & Susan [name] is c/o "stomach problems"- afraid to eat, pain in right side, some nausea & diarrhea; she has not lost any weight & is up 4# to 233# today; she has been evaluated by GI- DrPatterson & his note is reviewed (no better on Bentyl, Flagyl trial, Zantac); he felt she might have adhesions responsible for the symptoms; prev CT Abd(2006) is reviewed & prob needs to be repeated; Sed rate=38, CRP=0.6; she had a colonoscopy 11/13 which was wnl x poss adhesions & one sm adenoma removed- f/u 535yr he finally rec DIGEX 1Bid, high fiber & Metamucil; she is under considerable stress related to 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   She also has GU symptoms and DrTaavon (GYN) [facility] for hematuria & UTI- she is anxious & wants sooner appt...     BP is controlled on Atacand & Lasix; BP= 128/80 today & she remains too sedentary, morbidly obese and not interested in diet/ exercise/ wt reduction help...    She saw Lipid Clinic- they discussed diet & lifestyle issues, they prescribed Fenofibrate but she stopped it ("afraid it might affect my liver"); last FLP 3/14 on diet alone showed TChol 203, TG 77, HDL 74, LDL 117    She remains on Xanax 0.40m46mid for her nerves... We reviewed prob list, meds, xrays and labs> see below for updates >>   ~  August 05, 2013:  98mo43mo & Susan [name] reports that she is OK x for her weight & we discussed referral to [facility] for their Bariatric program...  She has been [facility] visits- notes reviewed, anxiety & depression, difficulty sleeping, ahedonia, work related issues,etc;  [facility] Abd [facility]14 and EGD 6/14 (see below);  She is followed for Ortho by DrKramer- had XRays and MRI of back is pending, ?worker's comp?...     BP is controlled on Atacand32 & Lasix40; BP= 138/84 & even better at home she says; denies CP, palpit, ch in SOB, edema...     Her Lipids were improved on diet alone when last checked and Lipid Clinic said she doesn't need meds...    Weight is unchanged at 233#, BMI=44, and she is referred to [facility]ric program at [facility] list, meds, xrays and labs> see below for updates >>   LABS 5/14 by DrHung reviewed> Hg=11.7, MCV=87  CT Abd&Pelvis 5/14 showed NAD- mod stool burden, 3mm 540msity in RML region noted...  EGD 6/14 showed normal esoph, 2 gastric polyps (benign- neg HPylori), norm duod...   ~  June 21, 2014:  45mo 80mo add-on at pt request for cough; she has established Primary Care w/ DrLeschber & was seen 4/15 but she describes considerable difficulty  w/ the office staff 7 refuses toreturn- now looking for new Primary coverage; she is c/o a 2wk hx cough, congestion in head & chest w/ drainage & coughing up green sput; she thought this might be from her teeth & went to see her dentist several days ago & apparently her teeth were ok & he diagnosed sinusitis, treated w/ Clindamycin orally; she has been on this for 5d now & states no better, denies fever or chills but has had some sweats; denies  CP but notes some wheezing & mild dyspnea;  Exam reveals afeb, sl hypertensive at 158/88, O2sat=100% on RA, scat rhonchi at bases, no consolidation;  CXR shows sl decr aeration (obesity) but NAD;  We discussed treatment w/ LEVAQUIN 517m x7d and MEDROL Dosepak; she will also start ADaltondaily, use Mucinex/ Fluids as needed...  We reviewed prob list, meds, xrays and labs> see below for updates >>   CXR 8/15 showed norm heart size, sl calcif in Ao, hypoinflation w/o infiltrate etc, NAD...           Problem List:     HYPERTENSION (ICD-401.9) - controlled on ATACAND 379md and FUROSEMIDE 4050m...  ~  10/11:  BP= 126/84 & taking meds regularly/ tolerating well> checks BP occas at home and usually runs 120's/ 80's; denies HA, fatigue, visual changes, CP, palipit, dizziness, syncope, dyspnea, edema, etc... ~  4/12:  BP= 128/88 & asked to monitor BP at home more often; we discussed the importance of low sodium & weight reduction for her BP control. ~  10/12:  BP= 124/90 & wt is up to 225#; asked to continue same meds & get wt down! ~  3/13:  BP= 140/80 & she has mult minor somatic complaints... ~  10/13:  BP= 128/76 on her 2 meds & she denies recent CP, palpit, dizzy, syncope, edema... ~  4/14:  BP= 128/80 on same 2 meds; remains asymptomatic from the CV standpoint but way too sedentary...  Hx of CHEST PAIN UNSPECIFIED (ICD-786.50) - on ASA 73m6m.. prev hx CWP; asked to diet, exercise, get wt down. ~  baseline EKG = NSR, WNL... ~Marland Kitchen CT Chest 1/11 showed  incidental calcif in coronary art, otherw CT was neg... ~  CXR 4/12 showed low lung vol w/ crowding of markings, NAD...  VENOUS INSUFFICIENCY (ICD-459.81) - she has trace edema & knows to elim salt, elevate legs, wear support hose, & take her Lasix.  HYPERLIPIDEMIA (ICD-272.4) - on diet Rx alone; tried Simv40 in 2009 & Prav40 in 2010 but INTOL w/ HA & insomnia; [facility]/ Crestor5 started but she stopped that too due to cramps... ~  FLP [place]7 showed TChol 191, TG 70, HDL 61, LDL 116 ~  FLP 2/09 showed TChol 224, Tg 85, HDL 71, LDL 135 ~  FLP 12/09 showed TChol 207, TG 73, HDL 58, LDL 138 ~  FLP 4/10 showed TChol 222, TG 92, HDL 70, LDL 135 ~  FLP 1/11 showed TChol 206, TG 77, HDL 66, LDL 130... not at goal, declines [facility]id[facility]inic. ~  FLP 10/11 showed TChol 228, TG 64, HDL 76, LDL 134... not at goal, declines low dose intermit Crestor. ~  4/12:  We reviewed her prev results & the need for meds> she agrees to Lipid Clinic referral... ~  2012:  Lipid Clinic tried Crestor 5mg 59m she stopped on her own due to muscle cramps & didn't ret to LC...[facility]  FLP 10/12 on diet alone showed TChol 203, TG 55, HDL 79, LDL 122... She refuses meds & refuses LC, will "do diet alone" ~  FLP 1[place] on diet alone showed TChol 255, TG 91, HDL 74, LDL 169... Still refuses meds, I begged her to ret to the lipid clinic... ~  Lipid Clinic discussed lifestyle changes & rec Fenofibrate but she stopped it on her own "afraid of liver damage" ~  FLP 3/14 on diet aone showed TChol 203, TG 77, HDL 74, LDL 117  DIABETES MELLITUS, BORDERLINE (  ICD-790.29) - on diet Rx alone but has been unable to lose wt, not exercising, etc... ~  labs 2/09 showed BS= 110, HgA1c= 6.2.Marland Kitchen. rec> diet Rx, get wt down. ~  labs 12/09 showed BS= 102, HgA1c= 6.2 ~  labs 4/10 showed BS= 110, A1c= 6.4 ~  labs 1/11 showed BS= 103... keep up the good work. ~  labs 10/11 showed BS= 79, A1c= 6.0 ~  Labs 10/12 on diet alone showed BS= 98, A1c=  6.1 ~  Labs 10/13 showed BS= 97, A1c= 6.1  MORBID OBESITY (ICD-278.01) - we have discussed Bariatric Surg and she has several relatives who have had surg> "I'm afraid"- pt encouraged to seek counsel at [facility]. ~  weight 2/09 = 247#... 5'1" tall... BMI=47...  ~  weight 3/10 = 256#... we reviewed diet + exercise therapy... ~  weight 1/11 = 240# ~  weight 6/11 = 223#... great job!!! ~  Weight 10/11 = 217# ~  Weight 4/12 = 219# ~  Weight 10/12 = 225# ~  Weight 3/13 = 226# ~  Weight 10/13 = 229# ~  Weight 4/14 = 232#  OTHER DYSPHAGIA (ICD-787.29) - prev hx upper esoph dysphagia w/ discomfort... no response to Zantac, but sl better w/ Alpraz... referred to GI, DrPatterson 1/11 w/ EGD showing mult benign gastric polyps, ?extrinsic compression> subseq CT Chest was neg x for coronary calcif seen... globus symptoms improved on HYDROXYZINE 38m Prn & she continues on ZANTAC vs PRILOSEC...  IRRITABLE BOWEL SYNDROME (ICD-564.1) >>  ~  last colon 5/06 by DrPatterson... normal exam. ~  10/13: eval by GI for IBS symptoms> treated w/ bulk agents and BENTYL 227mTid prn... ~  Persistent GI complaints, neg eval from DrPatterson, and no improvement w/ med trials; pt asked to get second opinion consult... ~  [facility]owed normal colon, prob pelvic adhesions, diminutive polyp (Bx= tubular adenoma), f/u suggested in 5 yrs...  SPONDYLOSIS, LUMBAR (ICD-721.3) - she had MRI 1/08 by DrKramer showing bulging discs and some facet arthropathy... takes ROBAXIN 50062mid Prn, and TRAMADOL 19m53md Prn...  Hx of HEADACHE (ICD-784.0) [facility] she uses Ibuprofen Prn...  Hx of PSEUDOTUMOR CEREBRI (ICD-348.2) - prev [facility]TyCampbell Ric[facility] Dr[facility] [facility] [place]  Mar[place]: [facility]...  ANXIETY (ICD-300.00) - has used XANAX 0.5mg 74mneeded;  Also had Valium on her list but prefers the  Alpraz Rx. ~  She notes considerable stress related to harrassment at work; asked to take the Alpraz0.5 tid...   ALOPECIA (ICD-704.00) & Seborrheic Dermatitis treated by DrJordan for Derm... ~  10/11: saw Derm DrJordan recently w/ seb dermatitis & given Doryx, Olux-E foam, Zinc shampoo, & Clobetasol shampoo   Past Surgical History  Procedure Laterality Date  . Cesarean section  1977  . Abscess drainage      abdominal    Outpatient Encounter Prescriptions as of 06/21/2014  Medication Sig  . ALPRAZolam (XANAX) 0.5 MG tablet 1/2 - 1 tablet by mouth three times daily as needed for nerves  . candesartan (ATACAND) 32 MG tablet Take 1 tablet (32 mg total) by mouth daily.  . furosemide (LASIX) 40 MG tablet Take 1 tablet (40 mg total) by mouth daily.  . traMADol (ULTRAM) 50 MG tablet Take 50 mg by mouth 3 (three) times daily as needed.  . levMarland Kitchenfloxacin (LEVAQUIN) 500 MG tablet Take 1 tablet (500  mg total) by mouth daily.  . methylPREDNIsolone (MEDROL DOSPACK) 4 MG tablet follow package directions  . [DISCONTINUED] fenofibrate 160 MG tablet Take 1 tablet (160 mg total) by mouth daily.    Allergies  Allergen Reactions  . Avelox [Moxifloxacin Hcl In Nacl] Anaphylaxis    Pt c/o swelling of her throat and tongue  . Codeine     REACTION: causes her heart to race  . Crestor [Rosuvastatin Calcium]     Pt states INTOLERANT w/ muscle cramps  . Penicillins     REACTION: rash  . Pravastatin Sodium [Pravachol]     REACTION: intolerant= headache & insomnia  . Shellfish Allergy     Rash and itching  . Simvastatin     REACTION: intolerant= headache & insomnia    Current Medications, Allergies, Past Medical History, Past Surgical History, Family History, and Social History were reviewed in [employer] record.    Review of Systems         See HPI - all other systems neg except as noted... The patient complains of dyspnea on exertion.  The patient denies anorexia, fever,  weight loss, weight gain, vision loss, decreased hearing, hoarseness, chest pain, syncope, peripheral edema, prolonged cough, headaches, hemoptysis, abdominal pain, melena, hematochezia, severe indigestion/heartburn, hematuria, incontinence, muscle weakness, suspicious skin lesions, transient blindness, difficulty walking, depression, unusual weight change, abnormal bleeding, enlarged lymph nodes, and angioedema.     Objective:   Physical Exam     WD, Obese, 58 y/o BF in NAD... GENERAL:  Alert & oriented; pleasant & cooperative. HEENT:  [place]/AT, EOM-wnl, PERRLA, EACs-clear, TMs-wnl, NOSE-clear, THROAT-clear & wnl. NECK:  Supple w/ fairROM; no JVD; normal carotid impulses w/o bruits; no thyromegaly or nodules palpated; no lymphadenopathy. CHEST:  sl decr  BS at bases w/ scat rhonchi, no signs of consolidation... HEART:  Regular Rhythm; without murmurs/ rubs/ or gallops. ABDOMEN:  Obese, soft & nontender; + panniculus; normal bowel sounds; no organomegaly or masses detected. EXT:  mild arthritic deformities bilat knees; no varicose veins/ +venous insuffic/ tr edema. NEURO:  CN's intact;  no focal neuro deficits... DERM:  No lesions noted; no rash etc...  RADIOLOGY DATA:  Reviewed in the EPIC EMR & discussed w/ the patient...  LABORATORY DATA:  Reviewed in the EPIC EMR & discussed w/ the patient...   Assessment & Plan:    URI/ Sinusitis/ Bronchitis>  CXR is clear- no pneumonia; we will switch to Levaquin, & Rx w/ Medrol dosepak; she knows to drink lots of water, avoid carbs/sweets/etc...   HBP>  Fair control on her ARB/ Diuretic; needs better job w/ wt reduction, diet, etc & we discussed this...  HYPERLIPID>  She has been followed in the [facility] but was apparently unable to tol even 64m Crestor, and had to stop; she refused Fenofibrate as well; on diet alone & f/u FLP looks somewhat better, continue diet efforts & get wt down...  DM, Borderline>  This has been well controlled on diet  alone, min DM, but at risk for worsening glucose control & needs to lose the wt...  GI>  Hx dyspagia, IBS, etc...globus symptoms improved w/ prev Hydroxyzine, neg colon in 2006; she has mult somatic complaints... She had [facility]...  IBS>  She is c/o IBS symptoms & we discussed Rx w/ Metamucil daily as stool normalizer & BENTYL 259mTid as needed for cramping; if symptoms worsen then she will f/u w/ GI, DrPatterson; she is under a  lot of stress & this can exac the IBS, she has Alpraz for Prn use... She had eval DrHung w/ Colon 11/13 as noted...  LBP>  Stable on Tramadol & Robaxin... Needs to lose the weight...  Anxiety>  Under a lot of stress as noted & she prefers the [place] over the Valium; wants FMLA form filled out for work stress but doesn't have the form...   Patient's Medications  New Prescriptions   LEVOFLOXACIN (LEVAQUIN) 500 MG TABLET    Take 1 tablet (500 mg total) by mouth daily.   METHYLPREDNISOLONE (MEDROL DOSPACK) 4 MG TABLET    follow package directions  Previous Medications   ALPRAZOLAM (XANAX) 0.5 MG TABLET    1/2 - 1 tablet by mouth three times daily as needed for nerves   CANDESARTAN (ATACAND) 32 MG TABLET    Take 1 tablet (32 mg total) by mouth daily.   FUROSEMIDE (LASIX) 40 MG TABLET    Take 1 tablet (40 mg total) by mouth daily.   TRAMADOL (ULTRAM) 50 MG TABLET    Take 50 mg by mouth 3 (three) times daily as needed.  Modified Medications   No medications on file  Discontinued Medications   FENOFIBRATE 160 MG TABLET    Take 1 tablet (160 mg total) by mouth daily.

## 2014-06-22 ENCOUNTER — Telehealth: Payer: Self-pay | Admitting: Pulmonary Disease

## 2014-06-22 MED ORDER — ALPRAZOLAM 0.5 MG PO TABS: ORAL_TABLET | ORAL | Status: DC

## 2014-06-22 MED ORDER — HYDROCODONE-HOMATROPINE 5-1.5 MG/5ML PO SYRP: 5.0000 mL | ORAL_SOLUTION | Freq: Four times a day (QID) | ORAL | Status: DC | PRN

## 2014-06-22 NOTE — Telephone Encounter (Signed)
Pt states that at her ov with SN yesterday he had agreed to refill her Xanax for her but [facility] states they never received this.  Also pt requests a refill on Hydromet cough syrup which is not on her med list.  Please advise if ok to refill Xanax and Hydromet

## 2014-06-22 NOTE — Telephone Encounter (Signed)
Per SN : OK to refill Xanax 0.5 mg # 90 1/2 to 1 tab po tid prn nerves and also Hydromet # 6 oz 1 tsp po every 6 hrs prn cough  Spoke with pt and advised that rx for Xanax was called to pharmacy and rx for Hydromet was left at front desk for pick up.

## 2014-07-12 ENCOUNTER — Encounter: Payer: Self-pay | Admitting: Gastroenterology

## 2014-07-30 ENCOUNTER — Telehealth: Payer: Self-pay | Admitting: Pulmonary Disease

## 2014-07-30 NOTE — Telephone Encounter (Signed)
[facility] request from the pt to transfer her rx for the alprazolam to fill for a 90 day supply.  SN is out of the office until Tuesday and we will speak with him at this time about this refill.

## 2014-08-03 MED ORDER — ALPRAZOLAM 0.5 MG PO TABS: ORAL_TABLET | ORAL | Status: DC

## 2014-08-03 NOTE — Telephone Encounter (Signed)
Ic alled and gave verbal ok to [facility] per SN. [place], CMA

## 2014-08-03 NOTE — Telephone Encounter (Signed)
Per SN---  Ok to refill these meds for the pt.  thanks

## 2014-08-04 ENCOUNTER — Telehealth: Payer: Self-pay | Admitting: Pulmonary Disease

## 2014-08-04 MED ORDER — ALPRAZOLAM 0.5 MG PO TABS: ORAL_TABLET | ORAL | Status: DC

## 2014-08-04 NOTE — Telephone Encounter (Signed)
Called and spoke with pt and she stated that she would like [facility]  This rx has been printed out and placed on SN cart to signed and will be faxed in tomorrow.

## 2014-08-05 NOTE — Telephone Encounter (Signed)
rx has been faxed into the pharmacy per pts request and she is aware.

## 2014-08-12 ENCOUNTER — Ambulatory Visit: Payer: Self-pay | Admitting: Internal Medicine

## 2014-08-18 ENCOUNTER — Other Ambulatory Visit: Payer: Self-pay | Admitting: Internal Medicine

## 2015-04-21 DIAGNOSIS — L089 Local infection of the skin and subcutaneous tissue, unspecified: Secondary | ICD-10-CM | POA: Insufficient documentation

## 2015-07-26 ENCOUNTER — Other Ambulatory Visit: Payer: Self-pay | Admitting: Sports Medicine

## 2015-07-26 DIAGNOSIS — M545 Low back pain: Secondary | ICD-10-CM

## 2015-08-04 ENCOUNTER — Ambulatory Visit
Admission: RE | Admit: 2015-08-04 | Discharge: 2015-08-04 | Disposition: A | Discharge status: Home / Self Care | Payer: Self-pay | Source: Ambulatory Visit | Attending: Sports Medicine | Admitting: Sports Medicine

## 2015-08-04 DIAGNOSIS — M545 Low back pain: Secondary | ICD-10-CM

## 2015-08-08 ENCOUNTER — Other Ambulatory Visit: Payer: Self-pay

## 2015-08-22 ENCOUNTER — Ambulatory Visit
Admission: RE | Admit: 2015-08-22 | Discharge: 2015-08-22 | Disposition: A | Discharge status: Home / Self Care | Payer: Worker's Compensation | Source: Ambulatory Visit | Attending: Sports Medicine | Admitting: Sports Medicine

## 2015-08-26 ENCOUNTER — Other Ambulatory Visit: Payer: Self-pay

## 2015-09-29 ENCOUNTER — Telehealth: Payer: Self-pay | Admitting: Pulmonary Disease

## 2015-09-29 MED ORDER — LEVOFLOXACIN 500 MG PO TABS: 500.0000 mg | ORAL_TABLET | Freq: Every day | ORAL | Status: DC

## 2015-09-29 MED ORDER — METHYLPREDNISOLONE 4 MG PO TBPK: ORAL_TABLET | ORAL | Status: DC

## 2015-09-29 NOTE — Telephone Encounter (Signed)
[phone], pt cb stating she initially told nurse she wanted afternoon appt but she would rather have morning appt instead

## 2015-09-29 NOTE — Telephone Encounter (Signed)
lmtcb X1 for pt  

## 2015-09-29 NOTE — Telephone Encounter (Signed)
Last ov with SN on 06/21/14  Called and spoke with patient. She c/o prod cough with green mucus, nasal congestion and drainage. Sore throat and SOB that started on 09/26/15. Denies any fever, nausea or vomiting. She states she is currently taking mucinex and hycodan and states that this is not helping her symptoms. I scheduled her for an ov with TP on 09/30/15. She states she would like a message to be sent to [facility] as well for his recommendations. Patient voiced understanding and had no further questions.   SN please advise

## 2015-09-29 NOTE — Telephone Encounter (Signed)
Per SN:  Pt's options are to either 1. Keep ov with TP tomorrow, or 2. Have [place] call in Levaquin 500mg  #7 1 po qd, medrol dosepak #1 take as directed, to take with probiotic as well as mucinex 600mg  2 pills bid.    (pt has not been seen since 05/2014).  Pt has chosen option 2.  Medications called in to preferred pharmacy.  rov with SN scheduled next month.  Nothing further needed.

## 2015-09-29 NOTE — Telephone Encounter (Signed)
[phone] calling back

## 2015-09-29 NOTE — Telephone Encounter (Signed)
Spoke with pt, advised that we have no morning appts to offer tomorrow morning.  Pt ok with keeping afternoon appt.  Also aware we are waiting on SN's recs for her between then and now.  SN please advise.  Thanks!

## 2015-09-30 ENCOUNTER — Ambulatory Visit: Payer: Self-pay | Admitting: Adult Health

## 2015-10-03 ENCOUNTER — Telehealth: Payer: Self-pay | Admitting: Pulmonary Disease

## 2015-10-03 NOTE — Telephone Encounter (Signed)
Spoke with pt. States that she has been having lots of coughing and wheezing. Advised her that it's been over 1 year since we have seen her and she would need to be seen if she wants to be treated. She agreed and I offered an appointment for tomorrow. She declined and states that she will go back to the pharmacy and get more medicine. Advised her to call us back if she changes her mind about appointment. Nothing further was needed at this time.

## 2015-11-03 ENCOUNTER — Ambulatory Visit: Payer: Self-pay | Admitting: Pulmonary Disease

## 2016-01-31 ENCOUNTER — Other Ambulatory Visit: Payer: Self-pay

## 2016-01-31 DIAGNOSIS — Z1231 Encounter for screening mammogram for malignant neoplasm of breast: Secondary | ICD-10-CM

## 2016-02-14 DIAGNOSIS — K080 Exfoliation of teeth due to systemic causes: Secondary | ICD-10-CM | POA: Diagnosis not present

## 2016-02-27 DIAGNOSIS — Z0000 Encounter for general adult medical examination without abnormal findings: Secondary | ICD-10-CM | POA: Diagnosis not present

## 2016-03-05 DIAGNOSIS — K080 Exfoliation of teeth due to systemic causes: Secondary | ICD-10-CM | POA: Diagnosis not present

## 2016-03-28 ENCOUNTER — Ambulatory Visit
Admission: RE | Admit: 2016-03-28 | Discharge: 2016-03-28 | Disposition: A | Discharge status: Home / Self Care | Payer: Federal, State, Local not specified - PPO | Source: Ambulatory Visit

## 2016-03-28 ENCOUNTER — Ambulatory Visit: Payer: Self-pay

## 2016-03-28 DIAGNOSIS — Z1231 Encounter for screening mammogram for malignant neoplasm of breast: Secondary | ICD-10-CM | POA: Diagnosis not present

## 2016-04-13 DIAGNOSIS — A630 Anogenital (venereal) warts: Secondary | ICD-10-CM | POA: Diagnosis not present

## 2016-04-13 DIAGNOSIS — Z01419 Encounter for gynecological examination (general) (routine) without abnormal findings: Secondary | ICD-10-CM | POA: Diagnosis not present

## 2016-04-13 DIAGNOSIS — Z6841 Body Mass Index (BMI) 40.0 and over, adult: Secondary | ICD-10-CM | POA: Diagnosis not present

## 2016-07-10 DIAGNOSIS — H25813 Combined forms of age-related cataract, bilateral: Secondary | ICD-10-CM | POA: Diagnosis not present

## 2016-07-12 ENCOUNTER — Encounter: Payer: Self-pay | Admitting: Student

## 2016-07-30 DIAGNOSIS — H25812 Combined forms of age-related cataract, left eye: Secondary | ICD-10-CM | POA: Diagnosis not present

## 2016-07-30 DIAGNOSIS — Z961 Presence of intraocular lens: Secondary | ICD-10-CM | POA: Diagnosis not present

## 2016-07-30 DIAGNOSIS — H2512 Age-related nuclear cataract, left eye: Secondary | ICD-10-CM | POA: Diagnosis not present

## 2016-08-20 DIAGNOSIS — H25811 Combined forms of age-related cataract, right eye: Secondary | ICD-10-CM | POA: Diagnosis not present

## 2016-08-20 DIAGNOSIS — H2511 Age-related nuclear cataract, right eye: Secondary | ICD-10-CM | POA: Diagnosis not present

## 2016-09-05 DIAGNOSIS — K080 Exfoliation of teeth due to systemic causes: Secondary | ICD-10-CM | POA: Diagnosis not present

## 2016-11-01 DIAGNOSIS — R05 Cough: Secondary | ICD-10-CM | POA: Diagnosis not present

## 2016-11-01 DIAGNOSIS — J028 Acute pharyngitis due to other specified organisms: Secondary | ICD-10-CM | POA: Diagnosis not present

## 2017-01-31 DIAGNOSIS — L669 Cicatricial alopecia, unspecified: Secondary | ICD-10-CM | POA: Diagnosis not present

## 2017-01-31 DIAGNOSIS — L089 Local infection of the skin and subcutaneous tissue, unspecified: Secondary | ICD-10-CM | POA: Diagnosis not present

## 2017-01-31 DIAGNOSIS — L658 Other specified nonscarring hair loss: Secondary | ICD-10-CM | POA: Diagnosis not present

## 2017-03-01 DIAGNOSIS — Z0000 Encounter for general adult medical examination without abnormal findings: Secondary | ICD-10-CM | POA: Diagnosis not present

## 2017-03-01 DIAGNOSIS — I10 Essential (primary) hypertension: Secondary | ICD-10-CM | POA: Diagnosis not present

## 2017-03-01 DIAGNOSIS — M25511 Pain in right shoulder: Secondary | ICD-10-CM | POA: Diagnosis not present

## 2017-03-07 DIAGNOSIS — K080 Exfoliation of teeth due to systemic causes: Secondary | ICD-10-CM | POA: Diagnosis not present

## 2017-03-20 DIAGNOSIS — M67911 Unspecified disorder of synovium and tendon, right shoulder: Secondary | ICD-10-CM | POA: Diagnosis not present

## 2017-04-25 ENCOUNTER — Ambulatory Visit
Admission: RE | Admit: 2017-04-25 | Discharge: 2017-04-25 | Disposition: A | Discharge status: Home / Self Care | Payer: Federal, State, Local not specified - PPO | Source: Ambulatory Visit | Attending: Obstetrics and Gynecology | Admitting: Obstetrics and Gynecology

## 2017-04-25 ENCOUNTER — Other Ambulatory Visit: Payer: Self-pay | Admitting: Obstetrics and Gynecology

## 2017-04-25 DIAGNOSIS — Z1231 Encounter for screening mammogram for malignant neoplasm of breast: Secondary | ICD-10-CM

## 2017-05-01 DIAGNOSIS — M67911 Unspecified disorder of synovium and tendon, right shoulder: Secondary | ICD-10-CM | POA: Diagnosis not present

## 2017-05-03 ENCOUNTER — Ambulatory Visit: Payer: Self-pay

## 2017-05-10 DIAGNOSIS — M545 Low back pain: Secondary | ICD-10-CM | POA: Diagnosis not present

## 2017-05-10 DIAGNOSIS — M5416 Radiculopathy, lumbar region: Secondary | ICD-10-CM | POA: Diagnosis not present

## 2017-06-05 DIAGNOSIS — M67911 Unspecified disorder of synovium and tendon, right shoulder: Secondary | ICD-10-CM | POA: Diagnosis not present

## 2017-08-14 ENCOUNTER — Ambulatory Visit (INDEPENDENT_AMBULATORY_CARE_PROVIDER_SITE_OTHER): Payer: Federal, State, Local not specified - PPO | Admitting: Orthopaedic Surgery

## 2017-08-14 ENCOUNTER — Ambulatory Visit (INDEPENDENT_AMBULATORY_CARE_PROVIDER_SITE_OTHER): Payer: Federal, State, Local not specified - PPO

## 2017-08-14 ENCOUNTER — Encounter (INDEPENDENT_AMBULATORY_CARE_PROVIDER_SITE_OTHER): Payer: Self-pay | Admitting: Orthopaedic Surgery

## 2017-08-14 VITALS — BP 171/94 | HR 69 | Ht 60.0 in | Wt 235.0 lb

## 2017-08-14 DIAGNOSIS — M75111 Incomplete rotator cuff tear or rupture of right shoulder, not specified as traumatic: Secondary | ICD-10-CM

## 2017-08-14 DIAGNOSIS — M542 Cervicalgia: Secondary | ICD-10-CM | POA: Diagnosis not present

## 2017-08-14 NOTE — Progress Notes (Addendum)
Office Visit Note   Patient: Susan [name]           Date of Birth: 11-Oct-1956           MRN: [id number] Visit Date: 08/14/2017              Requested by: No referring provider defined for this encounter. PCP: Patient, No Pcp Per   Assessment & Plan: Visit Diagnoses:  1. Neck pain   2. Partial tear of right rotator cuff     Plan: Reviewed the MRI scan of her right shoulder from August which shows  mild degenerative changes in her shoulder but certainly not severe enough that would require total shoulder arthroplasty. She has some partial tearing of the supraspinatus and option be shoulder arthroscopy with repair of the partially torn rotator cuff with the 14 mm retraction of the 12.5 mm tear. Scan was in August. She's had persistent symptoms failed injections and exercise program. She's been on anti-inflammatories without relief. She has been taking Ultram and Robaxin for a period of time about a year for a back problem. She use to  work for the [employer] and is now retired.   Follow-Up Instructions: No Follow-up on file.   Orders:  Orders Placed This Encounter  Procedures  . XR Cervical Spine 2 or 3 views   No orders of the defined types were placed in this encounter.     Procedures: No procedures performed   Clinical Data: No additional findings.   Subjective: Chief Complaint  Patient presents with  . Right Shoulder - Pain    HPI 61 year old female states she's been advised she needs to have total shoulder arthroplasty due to ongoing problems with her shoulder. She said that pain without start treating pain with overhead activity. At one point shoulder arthroscopy was discussed with her. Her mother is a patient of mine and is here for a second opinion. Patient had previous epidural injections states after several injection she lost the hair on her head.  Review of Systems Poss for history of the lumbar facet arthropathy without central or foraminal  stenosis. Hypertension, venous insufficiency, morbid obesity, hyperlipidemia, anxiety, IBS, borderline diabetes. Negative for neck pain negative fever chills.   Objective: Vital Signs: BP (!) 171/94   Pulse 69   Ht 5' (1.524 m)   Wt 235 lb (106.6 kg)   BMI 45.90 kg/m   Physical Exam  Constitutional: She is oriented to person, place, and time. She appears well-developed.  HENT:  Head: Normocephalic.  Right Ear: External ear normal.  Left Ear: External ear normal.  Eyes: Pupils are equal, round, and reactive to light.  Neck: No tracheal deviation present. No thyromegaly present.  Cardiovascular: Normal rate.   Pulmonary/Chest: Effort normal.  Abdominal: Soft.  Neurological: She is alert and oriented to person, place, and time.  Skin: Skin is warm and dry.  Psychiatric: She has a normal mood and affect. Her behavior is normal.    Ortho Exam patient is good flexion-extension of her cervical spine. Albrecht breast tenderness. Patient has some tenderness over spinous fossa pain with resisted supraspinatus testing. She does have supraspinatus resistance but has pain and some weakness. Internal/external rotation is satisfactory. Upper  reflexes are 2+. No pain with cervical compression. Biceps triceps is normal along the biceps is normal. Sedation anesthesia oral.  Specialty Comments:  No specialty comments available.  Imaging: No results found.   PMFS History: Patient Active Problem List   Diagnosis Date  Noted  . Acute sinusitis 06/21/2014  . Acute bronchitis 06/21/2014  . VENOUS INSUFFICIENCY 08/18/2010  . GLOBUS HYSTERICUS 11/03/2009  . SPONDYLOSIS, LUMBAR 01/17/2009  . HYPERLIPIDEMIA 11/26/2007  . MORBID OBESITY 11/26/2007  . ANXIETY 11/26/2007  . PSEUDOTUMOR CEREBRI 11/26/2007  . IRRITABLE BOWEL SYNDROME 11/26/2007  . HEADACHE 11/26/2007  . DIABETES MELLITUS, BORDERLINE 11/26/2007  . HYPERTENSION 11/25/2007   Past Medical History:  Diagnosis Date  . Anemia   .  Anxiety state, unspecified   . Benign intracranial hypertension   . Borderline diabetes   . HLD (hyperlipidemia)    intol of statins - follows with lipid clinic  . HTN (hypertension)   . Irritable bowel syndrome   . Lumbosacral spondylosis without myelopathy    improved with PT  . Morbid obesity ([place])     Family History  Problem Relation Age of Onset  . Breast cancer Mother   . Diabetes Mother   . Hypertension Mother   . Diabetes Father        on dialysis  . Kidney failure Father   . Colon cancer Maternal Grandmother     Past Surgical History:  Procedure Laterality Date  . ABSCESS DRAINAGE     abdominal  . CESAREAN SECTION  1977   Social History   Occupational History  . Retired     Metallurgist  .  [employer]    3rd shift    Social History Main Topics  . Smoking status: Never Smoker  . Smokeless tobacco: Never Used  . Alcohol use No  . Drug use: No  . Sexual activity: Not on file

## 2017-08-27 ENCOUNTER — Telehealth (INDEPENDENT_AMBULATORY_CARE_PROVIDER_SITE_OTHER): Payer: Self-pay | Admitting: Orthopaedic Surgery

## 2017-08-27 NOTE — Telephone Encounter (Signed)
Please advise 

## 2017-08-27 NOTE — Telephone Encounter (Signed)
Patient called and left message on voice mail today @ 10:05am  stating that she would like to schedule surgery with Dr. Lorin Mercy for her R shoulder.  She would like to reserve Monday November 26th, 2018.  Can we start a surgery sheet?

## 2017-08-28 NOTE — Telephone Encounter (Signed)
Blue sheet done

## 2017-09-03 DIAGNOSIS — L658 Other specified nonscarring hair loss: Secondary | ICD-10-CM | POA: Diagnosis not present

## 2017-09-03 DIAGNOSIS — D239 Other benign neoplasm of skin, unspecified: Secondary | ICD-10-CM | POA: Diagnosis not present

## 2017-09-03 DIAGNOSIS — L089 Local infection of the skin and subcutaneous tissue, unspecified: Secondary | ICD-10-CM | POA: Diagnosis not present

## 2017-09-03 DIAGNOSIS — L669 Cicatricial alopecia, unspecified: Secondary | ICD-10-CM | POA: Diagnosis not present

## 2017-09-08 DIAGNOSIS — D239 Other benign neoplasm of skin, unspecified: Secondary | ICD-10-CM | POA: Insufficient documentation

## 2017-09-09 DIAGNOSIS — J029 Acute pharyngitis, unspecified: Secondary | ICD-10-CM | POA: Diagnosis not present

## 2017-09-09 DIAGNOSIS — T50905A Adverse effect of unspecified drugs, medicaments and biological substances, initial encounter: Secondary | ICD-10-CM | POA: Diagnosis not present

## 2017-09-19 DIAGNOSIS — K080 Exfoliation of teeth due to systemic causes: Secondary | ICD-10-CM | POA: Diagnosis not present

## 2017-09-24 DIAGNOSIS — S29019A Strain of muscle and tendon of unspecified wall of thorax, initial encounter: Secondary | ICD-10-CM | POA: Diagnosis not present

## 2017-09-24 DIAGNOSIS — R300 Dysuria: Secondary | ICD-10-CM | POA: Diagnosis not present

## 2017-09-26 DIAGNOSIS — R3121 Asymptomatic microscopic hematuria: Secondary | ICD-10-CM | POA: Diagnosis not present

## 2017-09-26 DIAGNOSIS — M545 Low back pain: Secondary | ICD-10-CM | POA: Diagnosis not present

## 2017-09-26 DIAGNOSIS — N3000 Acute cystitis without hematuria: Secondary | ICD-10-CM | POA: Diagnosis not present

## 2017-10-07 DIAGNOSIS — Z6841 Body Mass Index (BMI) 40.0 and over, adult: Secondary | ICD-10-CM | POA: Diagnosis not present

## 2017-10-07 DIAGNOSIS — Z01419 Encounter for gynecological examination (general) (routine) without abnormal findings: Secondary | ICD-10-CM | POA: Diagnosis not present

## 2017-11-12 ENCOUNTER — Encounter (INDEPENDENT_AMBULATORY_CARE_PROVIDER_SITE_OTHER): Payer: Self-pay | Admitting: Orthopaedic Surgery

## 2017-11-12 ENCOUNTER — Ambulatory Visit (INDEPENDENT_AMBULATORY_CARE_PROVIDER_SITE_OTHER): Admitting: Orthopaedic Surgery

## 2017-11-12 VITALS — BP 153/91 | HR 69 | Ht 60.0 in | Wt 234.0 lb

## 2017-11-12 DIAGNOSIS — M545 Low back pain: Secondary | ICD-10-CM | POA: Diagnosis not present

## 2017-11-12 DIAGNOSIS — M4696 Unspecified inflammatory spondylopathy, lumbar region: Secondary | ICD-10-CM | POA: Diagnosis not present

## 2017-11-12 DIAGNOSIS — M47816 Spondylosis without myelopathy or radiculopathy, lumbar region: Secondary | ICD-10-CM

## 2017-11-12 MED ORDER — MELOXICAM 15 MG PO TABS: 15.0000 mg | ORAL_TABLET | Freq: Every day | ORAL | 4 refills | Status: DC

## 2017-11-12 MED ORDER — MELOXICAM 15 MG PO TABS: 15.0000 mg | ORAL_TABLET | Freq: Every day | ORAL | Status: DC

## 2017-11-12 NOTE — Progress Notes (Signed)
Office Visit Note   Patient: Susan [name]           Date of Birth: 02/08/1956           MRN: [id number] Visit Date: 11/12/2017              Requested by: No referring provider defined for this encounter. PCP: Patient, No Pcp Per   Assessment & Plan: Visit Diagnoses:  1. Facet arthritis of lumbar region [facility])     Plan: We reviewed the MRI scan with her as well as her report.  We discussed with her that she needs to work on weight loss to unload her lumbar spine and well is helping her generalized overall health.  I plan to recheck her in 6 months and we will weigh her on return.  We discussed the benefits of weight loss for general overall health as well as improvement in her back pain symptoms with the facet arthropathy and degenerative spondylolisthesis that is present.  Her goal would be to gradually get down to 150 pounds over about 2 yrs. she is going to join a gym work on a walking program, riding her exercise bike.  Meloxicam is renewed.  She could skip the Robaxin and tramadol.  Follow-Up Instructions: Return in about 6 months (around 05/12/2018), or if symptoms worsen or fail to improve.   Orders:  No orders of the defined types were placed in this encounter.  Meds ordered this encounter  Medications  . meloxicam (MOBIC) tablet 15 mg    # 30  Refill times 4  . meloxicam (MOBIC) 15 MG tablet    Sig: Take 1 tablet (15 mg total) by mouth daily.    Dispense:  30 tablet    Refill:  4      Procedures: No procedures performed   Clinical Data: No additional findings.   Subjective: Chief Complaint  Patient presents with  . Lower Back - Pain    HPI the patient is to work for the [employer] had problems with her back she states she is now retired.  She was seen by Dr. Lynann Bologna who had released her back to her PCP.  She had been on tramadol, meloxicam and methocarbamol.  She has time now for additional treatment.  She has had previous radiofrequency ablation  that lasted for period of time and improved her symptoms to some degree she relates today.  She states that cortisone injections caused her to lose her hair and does not want them anymore.  She denies fever chills no bowel or bladder symptoms.  She has back pain and more right than left buttocks symptoms most of the time sometimes worse on the left.  Review of Systems unchanged since her 08/14/2017 office visit other than as mentioned in HPI.  She states her shoulder is significantly improved since last visit.  She continues to have back pain problems as mentioned above.  Positive for morbid obesity.   Objective: Vital Signs: BP (!) 153/91   Pulse 69   Ht 5' (1.524 m)   Wt 234 lb (106.1 kg)   BMI 45.70 kg/m   Physical Exam  Constitutional: She is oriented to person, place, and time. She appears well-developed.  HENT:  Head: Normocephalic.  Right Ear: External ear normal.  Left Ear: External ear normal.  Eyes: Pupils are equal, round, and reactive to light.  Neck: No tracheal deviation present. No thyromegaly present.  Cardiovascular: Normal rate.  Pulmonary/Chest: Effort normal.  Abdominal:  Soft.  Neurological: She is alert and oriented to person, place, and time.  Skin: Skin is warm and dry.  Psychiatric: She has a normal mood and affect. Her behavior is normal.    Ortho Exam patient has negative straight leg raising normal heel toe gait.  She has some sciatic notch tenderness.  Specialty Comments:  No specialty comments available.  Imaging: MRI scan reviewed which shows some anterolisthesis at L4-5 without spondylolysis.  Mild disc degeneration at L4-5.  Facet degenerative changes at L4-5 and L5-S1.   PMFS History: Patient Active Problem List   Diagnosis Date Noted  . Acute sinusitis 06/21/2014  . Acute bronchitis 06/21/2014  . VENOUS INSUFFICIENCY 08/18/2010  . GLOBUS HYSTERICUS 11/03/2009  . SPONDYLOSIS, LUMBAR 01/17/2009  . HYPERLIPIDEMIA 11/26/2007  . MORBID  OBESITY 11/26/2007  . ANXIETY 11/26/2007  . PSEUDOTUMOR CEREBRI 11/26/2007  . IRRITABLE BOWEL SYNDROME 11/26/2007  . HEADACHE 11/26/2007  . DIABETES MELLITUS, BORDERLINE 11/26/2007  . HYPERTENSION 11/25/2007   Past Medical History:  Diagnosis Date  . Anemia   . Anxiety state, unspecified   . Benign intracranial hypertension   . Borderline diabetes   . HLD (hyperlipidemia)    intol of statins - follows with lipid clinic  . HTN (hypertension)   . Irritable bowel syndrome   . Lumbosacral spondylosis without myelopathy    improved with PT  . Morbid obesity ([place])     Family History  Problem Relation Age of Onset  . Breast cancer Mother   . Diabetes Mother   . Hypertension Mother   . Diabetes Father        on dialysis  . Kidney failure Father   . Colon cancer Maternal Grandmother     Past Surgical History:  Procedure Laterality Date  . ABSCESS DRAINAGE     abdominal  . CESAREAN SECTION  1977   Social History   Occupational History  . 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 Smoking status: Never Smoker  . Smokeless tobacco: Never Used  Substance and Sexual Activity  . Alcohol use: No  . Drug use: No  . Sexual activity: Not on file

## 2017-11-14 ENCOUNTER — Telehealth (INDEPENDENT_AMBULATORY_CARE_PROVIDER_SITE_OTHER): Payer: Self-pay

## 2017-11-14 MED ORDER — MELOXICAM 15 MG PO TABS: 15.0000 mg | ORAL_TABLET | Freq: Every day | ORAL | 4 refills | Status: DC

## 2017-11-14 NOTE — Telephone Encounter (Signed)
Patient called stating that Rx for Meloxicam was sent to incorrect pharmacy.  Patient would like for Rx to be sent to [facility] on [address].  Cb# is [phone].  Please advise.  Thank you

## 2017-11-14 NOTE — Addendum Note (Signed)
Addended by: Meyer Cory on: 11/14/2017 01:00 PM   Modules accepted: Orders

## 2017-11-14 NOTE — Telephone Encounter (Signed)
Canceled script to [place] med in to [facility] on [address].

## 2018-02-17 DIAGNOSIS — R109 Unspecified abdominal pain: Secondary | ICD-10-CM | POA: Diagnosis not present

## 2018-02-20 ENCOUNTER — Other Ambulatory Visit (HOSPITAL_COMMUNITY): Payer: Self-pay | Admitting: Internal Medicine

## 2018-02-20 DIAGNOSIS — R109 Unspecified abdominal pain: Secondary | ICD-10-CM

## 2018-02-21 ENCOUNTER — Ambulatory Visit (HOSPITAL_COMMUNITY)
Admission: RE | Admit: 2018-02-21 | Discharge: 2018-02-21 | Disposition: A | Discharge status: Home / Self Care | Payer: Federal, State, Local not specified - PPO | Source: Ambulatory Visit | Attending: Internal Medicine | Admitting: Internal Medicine

## 2018-02-21 DIAGNOSIS — R109 Unspecified abdominal pain: Secondary | ICD-10-CM | POA: Insufficient documentation

## 2018-02-26 DIAGNOSIS — R109 Unspecified abdominal pain: Secondary | ICD-10-CM | POA: Diagnosis not present

## 2018-02-26 DIAGNOSIS — R110 Nausea: Secondary | ICD-10-CM | POA: Diagnosis not present

## 2018-02-28 ENCOUNTER — Other Ambulatory Visit: Payer: Self-pay | Admitting: Gastroenterology

## 2018-02-28 DIAGNOSIS — R1084 Generalized abdominal pain: Secondary | ICD-10-CM

## 2018-02-28 DIAGNOSIS — Z86010 Personal history of colonic polyps: Secondary | ICD-10-CM | POA: Diagnosis not present

## 2018-03-03 ENCOUNTER — Ambulatory Visit
Admission: RE | Admit: 2018-03-03 | Discharge: 2018-03-03 | Disposition: A | Discharge status: Home / Self Care | Payer: Federal, State, Local not specified - PPO | Source: Ambulatory Visit | Attending: Gastroenterology | Admitting: Gastroenterology

## 2018-03-03 DIAGNOSIS — R1084 Generalized abdominal pain: Secondary | ICD-10-CM | POA: Diagnosis not present

## 2018-03-03 MED ORDER — IOHEXOL 300 MG/ML  SOLN
125.0000 mL | Freq: Once | INTRAMUSCULAR | Status: AC | PRN
  Administered 2018-03-03: 125 mL via INTRAVENOUS

## 2018-03-21 DIAGNOSIS — K640 First degree hemorrhoids: Secondary | ICD-10-CM | POA: Diagnosis not present

## 2018-03-21 DIAGNOSIS — Z86010 Personal history of colonic polyps: Secondary | ICD-10-CM | POA: Diagnosis not present

## 2018-03-27 DIAGNOSIS — K080 Exfoliation of teeth due to systemic causes: Secondary | ICD-10-CM | POA: Diagnosis not present

## 2018-04-29 ENCOUNTER — Other Ambulatory Visit: Payer: Self-pay | Admitting: Obstetrics and Gynecology

## 2018-04-29 DIAGNOSIS — Z1231 Encounter for screening mammogram for malignant neoplasm of breast: Secondary | ICD-10-CM

## 2018-05-16 ENCOUNTER — Ambulatory Visit
Admission: RE | Admit: 2018-05-16 | Discharge: 2018-05-16 | Disposition: A | Discharge status: Home / Self Care | Payer: Federal, State, Local not specified - PPO | Source: Ambulatory Visit | Attending: Obstetrics and Gynecology | Admitting: Obstetrics and Gynecology

## 2018-05-16 DIAGNOSIS — Z1231 Encounter for screening mammogram for malignant neoplasm of breast: Secondary | ICD-10-CM

## 2018-08-19 ENCOUNTER — Other Ambulatory Visit: Payer: Self-pay | Admitting: Nurse Practitioner

## 2018-08-19 DIAGNOSIS — R109 Unspecified abdominal pain: Secondary | ICD-10-CM | POA: Diagnosis not present

## 2018-08-19 DIAGNOSIS — R1012 Left upper quadrant pain: Secondary | ICD-10-CM | POA: Diagnosis not present

## 2018-08-19 LAB — HEPATIC FUNCTION PANEL: Bilirubin, Total: 0.3

## 2018-08-19 LAB — CBC AND DIFFERENTIAL
Neutrophils Absolute: 6
WBC: 8.9

## 2018-08-21 ENCOUNTER — Ambulatory Visit
Admission: RE | Admit: 2018-08-21 | Discharge: 2018-08-21 | Disposition: A | Discharge status: Home / Self Care | Payer: Federal, State, Local not specified - PPO | Source: Ambulatory Visit | Attending: Nurse Practitioner | Admitting: Nurse Practitioner

## 2018-08-21 DIAGNOSIS — R1012 Left upper quadrant pain: Secondary | ICD-10-CM

## 2018-08-21 DIAGNOSIS — D1809 Hemangioma of other sites: Secondary | ICD-10-CM | POA: Diagnosis not present

## 2018-08-21 DIAGNOSIS — R109 Unspecified abdominal pain: Secondary | ICD-10-CM

## 2018-09-05 DIAGNOSIS — J069 Acute upper respiratory infection, unspecified: Secondary | ICD-10-CM | POA: Diagnosis not present

## 2018-10-06 DIAGNOSIS — Z86010 Personal history of colonic polyps: Secondary | ICD-10-CM | POA: Diagnosis not present

## 2018-10-06 DIAGNOSIS — Z1159 Encounter for screening for other viral diseases: Secondary | ICD-10-CM | POA: Diagnosis not present

## 2018-10-06 DIAGNOSIS — Z0000 Encounter for general adult medical examination without abnormal findings: Secondary | ICD-10-CM | POA: Diagnosis not present

## 2018-10-06 DIAGNOSIS — I10 Essential (primary) hypertension: Secondary | ICD-10-CM | POA: Diagnosis not present

## 2018-10-06 DIAGNOSIS — E663 Overweight: Secondary | ICD-10-CM | POA: Diagnosis not present

## 2018-10-06 DIAGNOSIS — E782 Mixed hyperlipidemia: Secondary | ICD-10-CM | POA: Diagnosis not present

## 2018-10-06 LAB — HM HEPATITIS C SCREENING LAB: HM Hepatitis Screen: NEGATIVE

## 2018-10-27 ENCOUNTER — Telehealth: Payer: Self-pay | Admitting: Cardiology

## 2018-10-27 NOTE — Telephone Encounter (Signed)
Spoke with patient, after obtaining identification, who has not been seen here in quite some time.  She is asking about her past medications specifically her cholesterol medications.  Advised when she was last seen her cholesterol was at goal and she was not taking any medication at that time.  Prior to then she had taken several statins that she was intolerant of including simvastatin, pravastatin and rosuvastatin.  She has also taken fenofibrate as well.  Pt states understanding and thanked me for the information.

## 2018-10-27 NOTE — Telephone Encounter (Signed)
Patient walk-in : Patient wants to know the name of the cholesterol medication she was prescribe to. Please call patient.

## 2018-11-18 DIAGNOSIS — L821 Other seborrheic keratosis: Secondary | ICD-10-CM | POA: Diagnosis not present

## 2018-11-18 DIAGNOSIS — D23111 Other benign neoplasm of skin of right upper eyelid, including canthus: Secondary | ICD-10-CM | POA: Diagnosis not present

## 2018-11-18 DIAGNOSIS — D485 Neoplasm of uncertain behavior of skin: Secondary | ICD-10-CM | POA: Diagnosis not present

## 2018-12-08 DIAGNOSIS — Z01419 Encounter for gynecological examination (general) (routine) without abnormal findings: Secondary | ICD-10-CM | POA: Diagnosis not present

## 2018-12-08 DIAGNOSIS — Z1151 Encounter for screening for human papillomavirus (HPV): Secondary | ICD-10-CM | POA: Diagnosis not present

## 2018-12-08 DIAGNOSIS — Z6841 Body Mass Index (BMI) 40.0 and over, adult: Secondary | ICD-10-CM | POA: Diagnosis not present

## 2018-12-17 DIAGNOSIS — D485 Neoplasm of uncertain behavior of skin: Secondary | ICD-10-CM | POA: Diagnosis not present

## 2018-12-17 DIAGNOSIS — D23111 Other benign neoplasm of skin of right upper eyelid, including canthus: Secondary | ICD-10-CM | POA: Diagnosis not present

## 2018-12-17 DIAGNOSIS — D2310 Other benign neoplasm of skin of unspecified eyelid, including canthus: Secondary | ICD-10-CM | POA: Diagnosis not present

## 2019-02-06 DIAGNOSIS — J029 Acute pharyngitis, unspecified: Secondary | ICD-10-CM | POA: Diagnosis not present

## 2019-02-06 DIAGNOSIS — R197 Diarrhea, unspecified: Secondary | ICD-10-CM | POA: Diagnosis not present

## 2019-02-07 ENCOUNTER — Other Ambulatory Visit: Payer: Self-pay

## 2019-02-07 ENCOUNTER — Emergency Department (HOSPITAL_COMMUNITY)
Admission: EM | Admit: 2019-02-07 | Discharge: 2019-02-07 | Disposition: A | Discharge status: Home / Self Care | Payer: Federal, State, Local not specified - PPO | Attending: Emergency Medicine | Admitting: Emergency Medicine

## 2019-02-07 ENCOUNTER — Encounter (HOSPITAL_COMMUNITY): Payer: Self-pay | Admitting: Emergency Medicine

## 2019-02-07 ENCOUNTER — Emergency Department (HOSPITAL_COMMUNITY): Payer: Federal, State, Local not specified - PPO

## 2019-02-07 DIAGNOSIS — I10 Essential (primary) hypertension: Secondary | ICD-10-CM | POA: Diagnosis not present

## 2019-02-07 DIAGNOSIS — R079 Chest pain, unspecified: Secondary | ICD-10-CM | POA: Diagnosis not present

## 2019-02-07 DIAGNOSIS — Z79899 Other long term (current) drug therapy: Secondary | ICD-10-CM | POA: Diagnosis not present

## 2019-02-07 DIAGNOSIS — R0789 Other chest pain: Secondary | ICD-10-CM | POA: Diagnosis not present

## 2019-02-07 HISTORY — DX: Unspecified injury of lower back, initial encounter: S39.92XA

## 2019-02-07 LAB — COMPREHENSIVE METABOLIC PANEL
ALT: 10 U/L (ref 0–44)
AST: 17 U/L (ref 15–41)
Albumin: 4 g/dL (ref 3.5–5.0)
Alkaline Phosphatase: 89 U/L (ref 38–126)
Anion gap: 13 (ref 5–15)
BUN: 9 mg/dL (ref 8–23)
CO2: 24 mmol/L (ref 22–32)
Calcium: 9.6 mg/dL (ref 8.9–10.3)
Chloride: 102 mmol/L (ref 98–111)
Creatinine, Ser: 0.64 mg/dL (ref 0.44–1.00)
GFR calc Af Amer: >60 mL/min (ref >60)
GFR calc non Af Amer: >60 mL/min (ref >60)
Glucose, Bld: 96 mg/dL (ref 70–99)
Potassium: 4 mmol/L (ref 3.5–5.1)
Sodium: 139 mmol/L (ref 135–145)
Total Bilirubin: 0.6 mg/dL (ref 0.3–1.2)
Total Protein: 7.6 g/dL (ref 6.5–8.1)

## 2019-02-07 LAB — CBC WITH DIFFERENTIAL/PLATELET
Abs Immature Granulocytes: 0.01 10*3/uL (ref 0.00–0.07)
Basophils Absolute: 0 10*3/uL (ref 0.0–0.1)
Basophils Relative: 0 %
Eosinophils Absolute: 0 10*3/uL (ref 0.0–0.5)
Eosinophils Relative: 0 %
HCT: 40 % (ref 36.0–46.0)
Hemoglobin: 12.6 g/dL (ref 12.0–15.0)
Immature Granulocytes: 0 %
Lymphocytes Relative: 36 %
Lymphs Abs: 2.8 10*3/uL (ref 0.7–4.0)
MCH: 29.1 pg (ref 26.0–34.0)
MCHC: 31.5 g/dL (ref 30.0–36.0)
MCV: 92.4 fL (ref 80.0–100.0)
Monocytes Absolute: 0.5 10*3/uL (ref 0.1–1.0)
Monocytes Relative: 6 %
Neutro Abs: 4.5 10*3/uL (ref 1.7–7.7)
Neutrophils Relative %: 58 %
Platelets: 367 10*3/uL (ref 150–400)
RBC: 4.33 MIL/uL (ref 3.87–5.11)
RDW: 13.3 % (ref 11.5–15.5)
WBC: 7.8 10*3/uL (ref 4.0–10.5)
nRBC: 0 % (ref 0.0–0.2)

## 2019-02-07 LAB — D-DIMER, QUANTITATIVE: D-Dimer, Quant: <0.27 ug/mL-FEU (ref 0.00–0.50)

## 2019-02-07 LAB — LIPASE, BLOOD: Lipase: 35 U/L (ref 11–51)

## 2019-02-07 LAB — TROPONIN I: Troponin I: <0.03 ng/mL (ref <0.03)

## 2019-02-07 MED ORDER — ACETAMINOPHEN 325 MG PO TABS
650.0000 mg | ORAL_TABLET | ORAL | Status: DC | PRN
  Administered 2019-02-07: 650 mg via ORAL
  Filled 2019-02-07: qty 2

## 2019-02-07 NOTE — ED Provider Notes (Signed)
[facility] EMERGENCY DEPARTMENT Provider Note   CSN: [id number] Arrival date & time: 02/07/19  1029    History   Chief Complaint Chief Complaint  Patient presents with   Chest Pain   Back Pain   Hypertension    HPI Susan [name] is a 63 y.o. female.     HPI Patient presents with left-sided chest pain that radiates to her left thoracic back which started yesterday.  States the pain is improved today.  Pain is worse with deep breathing.  She is had mild cough which is also improved.  She denies any fever or shaking chills.  States she took her blood pressure this morning and it was elevated with a systolic of 341.  Denies any new lower extremity swelling or pain.  No recent travel.  No known coronavirus exposure. Past Medical History:  Diagnosis Date   Anemia    Anxiety state, unspecified    Back injury    Benign intracranial hypertension    Borderline diabetes    HLD (hyperlipidemia)    intol of statins - follows with lipid clinic   HTN (hypertension)    Irritable bowel syndrome    Lumbosacral spondylosis without myelopathy    improved with PT   Morbid obesity [facility])     Patient Active Problem List   Diagnosis Date Noted   Acute sinusitis 06/21/2014   Acute bronchitis 06/21/2014   VENOUS INSUFFICIENCY 08/18/2010   GLOBUS HYSTERICUS 11/03/2009   SPONDYLOSIS, LUMBAR 01/17/2009   HYPERLIPIDEMIA 11/26/2007   MORBID OBESITY 11/26/2007   ANXIETY 11/26/2007   PSEUDOTUMOR CEREBRI 11/26/2007   IRRITABLE BOWEL SYNDROME 11/26/2007   HEADACHE 11/26/2007   DIABETES MELLITUS, BORDERLINE 11/26/2007   HYPERTENSION 11/25/2007    Past Surgical History:  Procedure Laterality Date   ABSCESS DRAINAGE     abdominal   CESAREAN SECTION  1977     OB History   No obstetric history on file.      Home Medications    Prior to Admission medications   Medication Sig Start Date End Date Taking? Authorizing Provider    acetaminophen (TYLENOL) 500 MG tablet Take 1,000 mg by mouth every 6 (six) hours as needed for mild pain or moderate pain.   Yes [provider]  candesartan (ATACAND) 32 MG tablet TAKE 1 TABLET DAILY Patient taking differently: Take 32 mg by mouth daily.  08/18/14  Yes Rowe Clack, MD  furosemide (LASIX) 40 MG tablet Take 1 tablet (40 mg total) by mouth daily. 03/22/14  Yes Rowe Clack, MD  orphenadrine (NORFLEX) 100 MG tablet Take 100 mg by mouth as needed for muscle spasms.  01/21/19  Yes [provider]  ALPRAZolam Duanne Moron) 0.5 MG tablet 1/2 - 1 tablet by mouth three times daily as needed for nerves Patient not taking: Reported on 08/14/2017 08/04/14   Noralee Space, MD  HYDROcodone-homatropine [facility]) 5-1.5 MG/5ML syrup Take 5 mLs by mouth every 6 (six) hours as needed for cough. Patient not taking: Reported on 08/14/2017 06/22/14   Noralee Space, MD  levofloxacin (LEVAQUIN) 500 MG tablet Take 1 tablet (500 mg total) by mouth daily. Patient not taking: Reported on 08/14/2017 06/21/14   Noralee Space, MD  levofloxacin (LEVAQUIN) 500 MG tablet Take 1 tablet (500 mg total) by mouth daily. Patient not taking: Reported on 08/14/2017 09/29/15   Noralee Space, MD  meloxicam (MOBIC) 15 MG tablet Take 1 tablet (15 mg total) by mouth daily. Patient not taking:  Reported on 02/07/2019 11/14/17   Marybelle Killings, MD  methylPREDNISolone (MEDROL DOSEPAK) 4 MG TBPK tablet Take as directed Patient not taking: Reported on 08/14/2017 09/29/15   Noralee Space, MD  methylPREDNIsolone (MEDROL [facility]) 4 MG tablet follow package directions Patient not taking: Reported on 08/14/2017 06/21/14   Noralee Space, MD    Family History Family History  Problem Relation Age of Onset   Breast cancer Mother    Diabetes Mother    Hypertension Mother    Diabetes Father        on dialysis   Kidney failure Father    Colon cancer Maternal Grandmother     Social History Social  History   Tobacco Use   Smoking status: Never Smoker   Smokeless tobacco: Never Used  Substance Use Topics   Alcohol use: No   Drug use: No     Allergies   Aspirin; Avelox [moxifloxacin hcl in nacl]; Codeine; Crestor [rosuvastatin calcium]; Penicillins; Pravastatin sodium [pravachol]; Shellfish allergy; and Simvastatin   Review of Systems Review of Systems  Constitutional: Negative for chills and fever.  HENT: Negative for sore throat and trouble swallowing.   Eyes: Negative for visual disturbance.  Respiratory: Positive for cough. Negative for shortness of breath and wheezing.   Cardiovascular: Positive for chest pain. Negative for palpitations and leg swelling.  Gastrointestinal: Negative for abdominal pain, constipation, diarrhea, nausea and vomiting.  Genitourinary: Negative for dysuria, flank pain and frequency.  Musculoskeletal: Positive for back pain. Negative for neck pain and neck stiffness.  Skin: Negative for rash and wound.  Neurological: Negative for dizziness, weakness, light-headedness, numbness and headaches.  All other systems reviewed and are negative.    Physical Exam Updated Vital Signs BP 120/79 (BP Location: Right Arm)    Pulse 62    Temp 98.9 F (37.2 C) (Oral)    Resp 20    SpO2 99%   Physical Exam Vitals signs and nursing note reviewed.  Constitutional:      Appearance: Normal appearance. She is well-developed.  HENT:     Head: Normocephalic and atraumatic.     Nose: Nose normal.     Mouth/Throat:     Mouth: Mucous membranes are moist.  Eyes:     Pupils: Pupils are equal, round, and reactive to light.  Neck:     Musculoskeletal: Normal range of motion and neck supple. No neck rigidity or muscular tenderness.     Vascular: No carotid bruit.  Cardiovascular:     Rate and Rhythm: Normal rate and regular rhythm.     Heart sounds: No murmur. No friction rub. No gallop.   Pulmonary:     Effort: Pulmonary effort is normal. No respiratory  distress.     Breath sounds: Normal breath sounds. No stridor. No wheezing, rhonchi or rales.     Comments: Chest pain reproduced with palpation of the left chest in the parasternal region.  There is no crepitance or deformity. Chest:     Chest wall: Tenderness present.  Abdominal:     General: Bowel sounds are normal.     Palpations: Abdomen is soft.     Tenderness: There is no abdominal tenderness. There is no guarding or rebound.  Musculoskeletal: Normal range of motion.        General: No swelling, tenderness, deformity or signs of injury.     Right lower leg: No edema.     Left lower leg: No edema.     Comments: No midline thoracic  or lumbar tenderness.  No lower extremity swelling, asymmetry or tenderness.  Distal pulses are 2+.  Lymphadenopathy:     Cervical: No cervical adenopathy.  Skin:    General: Skin is warm and dry.     Capillary Refill: Capillary refill takes less than 2 seconds.     Findings: No erythema or rash.  Neurological:     General: No focal deficit present.     Mental Status: She is alert and oriented to person, place, and time.  Psychiatric:        Mood and Affect: Mood normal.        Behavior: Behavior normal.      ED Treatments / Results  Labs (all labs ordered are listed, but only abnormal results are displayed) Labs Reviewed  CBC WITH DIFFERENTIAL/PLATELET  COMPREHENSIVE METABOLIC PANEL  LIPASE, BLOOD  TROPONIN I  D-DIMER, QUANTITATIVE (NOT AT [facility])    EKG EKG Interpretation  Date/Time:  Saturday February 07 2019 10:40:19 EDT Ventricular Rate:  65 PR Interval:    QRS Duration: 90 QT Interval:  409 QTC Calculation: 426 R Axis:   -19 Text Interpretation:  Sinus rhythm Low voltage, precordial leads LVH by voltage Confirmed by Julianne Rice [phone]) on 02/07/2019 11:47:43 AM   Radiology Dg Chest Port 1 View  Result Date: 02/07/2019 CLINICAL DATA:  Chest pain EXAM: PORTABLE CHEST 1 VIEW COMPARISON:  06/21/2014 FINDINGS: The heart size  and mediastinal contours are within normal limits. Both lungs are clear. The visualized skeletal structures are unremarkable. IMPRESSION: No active disease. Electronically Signed   By: Kathreen Devoid   On: 02/07/2019 12:26    Procedures Procedures (including critical care time)  Medications Ordered in ED Medications  acetaminophen (TYLENOL) tablet 650 mg (has no administration in time range)     Initial Impression / Assessment and Plan / ED Course  I have reviewed the triage vital signs and the nursing notes.  Pertinent labs & imaging results that were available during my care of the patient were reviewed by me and considered in my medical decision making (see chart for details).       RIAN KOON was evaluated in Emergency Department on 02/07/2019 for the symptoms described in the history of present illness. She was evaluated in the context of the global COVID-19 pandemic, which necessitated consideration that the patient might be at risk for infection with the SARS-CoV-2 virus that causes COVID-19. Institutional protocols and algorithms that pertain to the evaluation of patients at risk for COVID-19 are in a state of rapid change based on information released by regulatory bodies including the CDC and federal and state organizations. These policies and algorithms were followed during the patient's care in the ED.  Single troponin is sufficient to rule out acute MI given symptoms started yesterday.  D-dimer is normal.  The rest of her laboratory work-up and chest x-ray without acute findings.  Low suspicion for coronavirus.  Given symptoms are reproduced with palpation suspect chest wall pain.  I have advised Tylenol and heating pad.  Advised to follow-up with her primary physician and strict return precautions given.  Final Clinical Impressions(s) / ED Diagnoses   Final diagnoses:  Chest wall pain    ED Discharge Orders    None       Julianne Rice, MD 02/07/19 1409

## 2019-02-07 NOTE — ED Notes (Signed)
Got patient undress on the monitor did ekg shown to er doctor patient is resting with call bell in reach 

## 2019-02-07 NOTE — ED Triage Notes (Addendum)
Pt states she is here for evaluation of chest pain and back pain since yesterday that was worse yesterday. Pt also reports her throat has been sore for several days and sometimes it is hard for her to breathe. Pt has hx of asthma. No fevers. Pt also reports recent headaches. Pt also states her daughter is in the medical field and also traveled to [place] [employer].

## 2019-02-11 DIAGNOSIS — J029 Acute pharyngitis, unspecified: Secondary | ICD-10-CM | POA: Diagnosis not present

## 2019-04-06 DIAGNOSIS — I10 Essential (primary) hypertension: Secondary | ICD-10-CM | POA: Diagnosis not present

## 2019-04-06 DIAGNOSIS — E785 Hyperlipidemia, unspecified: Secondary | ICD-10-CM | POA: Diagnosis not present

## 2019-04-06 LAB — LIPID PANEL
Cholesterol: 251 — AB (ref 0–200)
HDL: 174 — AB (ref 35–70)
LDL Cholesterol: 152
Triglycerides: 109 (ref 40–160)

## 2019-05-06 DIAGNOSIS — E785 Hyperlipidemia, unspecified: Secondary | ICD-10-CM | POA: Diagnosis not present

## 2019-05-22 DIAGNOSIS — I10 Essential (primary) hypertension: Secondary | ICD-10-CM | POA: Diagnosis not present

## 2019-05-22 DIAGNOSIS — E785 Hyperlipidemia, unspecified: Secondary | ICD-10-CM | POA: Diagnosis not present

## 2019-05-22 LAB — BASIC METABOLIC PANEL
BUN: 19 (ref 4–21)
Creatinine: 0.7 (ref 0.5–1.1)
Glucose: 94
Potassium: 4.4 (ref 3.4–5.3)
Sodium: 140 (ref 137–147)

## 2019-05-22 LAB — LIPID PANEL
Cholesterol: 239 — AB (ref 0–200)
HDL: 74 — AB (ref 35–70)
LDL Cholesterol: 142
Triglycerides: 112 (ref 40–160)

## 2019-05-26 DIAGNOSIS — K5900 Constipation, unspecified: Secondary | ICD-10-CM | POA: Diagnosis not present

## 2019-05-26 DIAGNOSIS — K6289 Other specified diseases of anus and rectum: Secondary | ICD-10-CM | POA: Diagnosis not present

## 2019-05-26 DIAGNOSIS — K648 Other hemorrhoids: Secondary | ICD-10-CM | POA: Diagnosis not present

## 2019-06-26 ENCOUNTER — Other Ambulatory Visit: Payer: Self-pay | Admitting: Obstetrics and Gynecology

## 2019-06-26 DIAGNOSIS — Z1231 Encounter for screening mammogram for malignant neoplasm of breast: Secondary | ICD-10-CM

## 2019-07-06 ENCOUNTER — Ambulatory Visit: Payer: Federal, State, Local not specified - PPO

## 2019-07-21 DIAGNOSIS — I10 Essential (primary) hypertension: Secondary | ICD-10-CM | POA: Diagnosis not present

## 2019-07-21 DIAGNOSIS — E785 Hyperlipidemia, unspecified: Secondary | ICD-10-CM | POA: Diagnosis not present

## 2019-07-24 ENCOUNTER — Encounter: Payer: Self-pay | Admitting: Family Medicine

## 2019-08-12 ENCOUNTER — Telehealth: Payer: Self-pay | Admitting: Family Medicine

## 2019-08-12 NOTE — Telephone Encounter (Signed)
See note  Copied from [place] [id number]. Topic: General - Other >> Aug 12, 2019  8:24 AM Keene Breath wrote: Reason for CRM: Patient called to ask the nurse if her medical records were received from her previous physician.  Please advise and call to discuss at [phone]

## 2019-08-12 NOTE — Telephone Encounter (Signed)
See note

## 2019-08-12 NOTE — Telephone Encounter (Signed)
Patient notified no records

## 2019-08-12 NOTE — Telephone Encounter (Signed)
Patient called to say that her old Doctor office say that they faxed the records over on 07/22/2019. Called to inform Susan [name] that this is what she was told today. Please advise Ph#  [phone]

## 2019-08-13 NOTE — Telephone Encounter (Signed)
Patient's records have been received, but have not been abstracted into her chart yet.

## 2019-08-14 NOTE — Telephone Encounter (Signed)
Noted  

## 2019-08-21 ENCOUNTER — Encounter: Payer: Self-pay | Admitting: Family Medicine

## 2019-08-21 ENCOUNTER — Other Ambulatory Visit: Payer: Self-pay

## 2019-08-21 ENCOUNTER — Ambulatory Visit: Payer: Federal, State, Local not specified - PPO | Admitting: Family Medicine

## 2019-08-21 VITALS — BP 144/82 | HR 63 | Temp 98.2°F | Ht 60.0 in | Wt 221.5 lb

## 2019-08-21 DIAGNOSIS — L659 Nonscarring hair loss, unspecified: Secondary | ICD-10-CM

## 2019-08-21 DIAGNOSIS — E785 Hyperlipidemia, unspecified: Secondary | ICD-10-CM

## 2019-08-21 DIAGNOSIS — K649 Unspecified hemorrhoids: Secondary | ICD-10-CM

## 2019-08-21 DIAGNOSIS — K644 Residual hemorrhoidal skin tags: Secondary | ICD-10-CM | POA: Diagnosis not present

## 2019-08-21 DIAGNOSIS — I10 Essential (primary) hypertension: Secondary | ICD-10-CM

## 2019-08-21 MED ORDER — HYDROCHLOROTHIAZIDE 25 MG PO TABS: 25.0000 mg | ORAL_TABLET | Freq: Every day | ORAL | 3 refills | Status: DC

## 2019-08-21 MED ORDER — LOVASTATIN 20 MG PO TABS: 20.0000 mg | ORAL_TABLET | Freq: Every day | ORAL | 3 refills | Status: DC

## 2019-08-21 NOTE — Assessment & Plan Note (Signed)
Has tolerated lovastatin well in the past.  Will restart.  Check lipid panel next blood draw.

## 2019-08-21 NOTE — Assessment & Plan Note (Signed)
Continue management per surgery.

## 2019-08-21 NOTE — Progress Notes (Signed)
Chief Complaint:  Susan [name] is a 63 y.o. female who presents today with a chief complaint of hypertension and to establish care.   Assessment/Plan:  Essential hypertension We will switch to HCTZ 25 mg daily.  Stop diltiazem.  Discussed potential side effects.  She will follow-up with me via MyChart or phone call in the next few weeks.  She will follow-up for her CPE in the next few months.  Dyslipidemia Has tolerated lovastatin well in the past.  Will restart.  Check lipid panel next blood draw.  Alopecia Continue management per dermatology.  Hemorrhoid Continue management per surgery.     Subjective:  HPI:  Her stable, chronic medical conditions are outlined below:  # Essential hypertension Currently on diltiazem 90 mg twice daily.  She has been on this for a few months.  She thinks it is causing her hair to fall out.  She would like to try new medication.  Was previously on candesartan which worked well.  Does not member why she switch.  No reported chest pain or shortness of breath. On diltiazem  For a few months - 2 times per day Was on candesartan  #Dyslipidemia Has been on Crestor however had to stop due to side effects.  Has been on other medications in the past which she did not tolerate.  She thinks that she was on lovastatin in the past and tolerated well.  She would like to restart this.  She is working on eating healthy diet and staying active.  % Alopecia Sees dermatology.  # Hemorrhoid Sees Surgery  ROS: Per HPI, otherwise a complete review of systems was negative.   PMH:  The following were reviewed and entered/updated in epic: Past Medical History:  Diagnosis Date  . Allergy   . Anemia   . Anxiety state, unspecified   . Back injury   . HLD (hyperlipidemia)    intol of statins - follows with lipid clinic  . HTN (hypertension)   . Irritable bowel syndrome   . Lumbosacral spondylosis without myelopathy    improved with PT  . Morbid  obesity [facility])    Patient Active Problem List   Diagnosis Date Noted  . Alopecia 08/21/2019  . Hemorrhoid 08/21/2019  . VENOUS INSUFFICIENCY 08/18/2010  . SPONDYLOSIS, LUMBAR 01/17/2009  . Dyslipidemia 11/26/2007  . Essential hypertension 11/25/2007   Past Surgical History:  Procedure Laterality Date  . ABSCESS DRAINAGE     abdominal  . CESAREAN SECTION  1977    Family History  Problem Relation Age of Onset  . Breast cancer Mother   . Diabetes Mother   . Hypertension Mother   . Diabetes Father        on dialysis  . Kidney failure Father   . Colon cancer Maternal Grandmother     Medications- reviewed and updated Current Outpatient Medications  Medication Sig Dispense Refill  . furosemide (LASIX) 40 MG tablet Take 1 tablet (40 mg total) by mouth daily. 90 tablet 3  . orphenadrine (NORFLEX) 100 MG tablet Take 100 mg by mouth as needed for muscle spasms.     . hydrochlorothiazide (HYDRODIURIL) 25 MG tablet Take 1 tablet (25 mg total) by mouth daily. 90 tablet 3  . lovastatin (MEVACOR) 20 MG tablet Take 1 tablet (20 mg total) by mouth at bedtime. 90 tablet 3   No current facility-administered medications for this visit.     Allergies-reviewed and updated Allergies  Allergen Reactions  . Aspirin  Other reaction(s): Bleeding  . Avelox [Moxifloxacin Hcl In Nacl] Anaphylaxis    Pt c/o swelling of her throat and tongue  . Codeine     REACTION: causes her heart to race  . Crestor [Rosuvastatin Calcium]     Pt states INTOLERANT w/ muscle cramps  . Ibuprofen     Gastric bleeding  . Penicillins     REACTION: rash  . Pravastatin Sodium [Pravachol]     REACTION: intolerant= headache & insomnia  . Shellfish Allergy     Rash and itching  . Simvastatin     REACTION: intolerant= headache & insomnia    Social History   Socioeconomic History  . Marital status: Divorced    Spouse name: Not on file  . Number of children: 1  . Years of education: Not on file  . Highest  education level: Not on file  Occupational History  . 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 Financial resource strain: Not on file  . Food insecurity    Worry: Not on file    Inability: Not on file  . Transportation needs    Medical: Not on file    Non-medical: Not on file  Tobacco Use  . Smoking status: Never Smoker  . Smokeless tobacco: Never Used  Substance and Sexual Activity  . Alcohol use: No  . Drug use: No  . Sexual activity: Not on file  Lifestyle  . Physical activity    Days per week: Not on file    Minutes per session: Not on file  . Stress: Not on file  Relationships  . Social Herbalist on phone: Not on file    Gets together: Not on file    Attends religious service: Not on file    Active member of club or organization: Not on file    Attends meetings of clubs or organizations: Not on file    Relationship status: Not on file  Other Topics Concern  . 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 Soft, Nontender, Nondistended. MSK: No edema, cyanosis, or clubbing noted Skin: Warm, dry Neuro: Grossly normal, moves all extremities Psych: Normal affect and thought content  No results found for this or any previous visit (from the past 24 hour(s)).      Algis Greenhouse. Jerline Pain, MD 08/21/2019 9:18 AM

## 2019-08-21 NOTE — Assessment & Plan Note (Signed)
We will switch to HCTZ 25 mg daily.  Stop diltiazem.  Discussed potential side effects.  She will follow-up with me via MyChart or phone call in the next few weeks.  She will follow-up for her CPE in the next few months.

## 2019-08-21 NOTE — Patient Instructions (Signed)
It was very nice to see you today!  Please start the hydrochlorothiazide and lovastatin.  Keep an eye on your blood pressures and let me know if persistently 150/90 or higher.  Come back to see me when you would be due for your annual wellness visit.  Take care, Dr Jerline Pain  Please try these tips to maintain a healthy lifestyle:   Eat at least 3 REAL meals and 1-2 snacks per day.  Aim for no more than 5 hours between eating.  If you eat breakfast, please do so within one hour of getting up.    Obtain twice as many fruits/vegetables as protein or carbohydrate foods for both lunch and dinner. (Half of each meal should be fruits/vegetables, one quarter protein, and one quarter starchy carbs)   Cut down on sweet beverages. This includes juice, soda, and sweet tea.    Exercise at least 150 minutes every week.

## 2019-08-21 NOTE — Assessment & Plan Note (Signed)
Continue management per dermatology.  

## 2019-08-26 ENCOUNTER — Telehealth: Payer: Self-pay | Admitting: Family Medicine

## 2019-08-26 NOTE — Telephone Encounter (Signed)
Patient was seen 08/21/2019 by PCP and states her BP medication was changed. Patient would like to discuss alternating her medication due to BP being on and off high within the last few days, 148/91 , 178/110 , 142/ 88. Patient would like a follow up call today, please advise

## 2019-08-26 NOTE — Telephone Encounter (Signed)
See below

## 2019-08-26 NOTE — Telephone Encounter (Signed)
Requesting to increase Hydrochlorothiazide,Notified patient to give medication a chance to work monitor BP and let us know next week if any improvement,she voices understanding.Patient had a allergic reaction to Lovastatin,causing her throat to swell she stopped taking Rx used benadryl,symptoms resolved. Please advise

## 2019-08-27 NOTE — Telephone Encounter (Signed)
Patient notified

## 2019-08-27 NOTE — Telephone Encounter (Signed)
Agree with recommendations. Would like for her to give it another week or two before making further adjustments.

## 2019-09-01 ENCOUNTER — Ambulatory Visit: Payer: Federal, State, Local not specified - PPO | Admitting: Family Medicine

## 2019-09-04 ENCOUNTER — Other Ambulatory Visit: Payer: Self-pay

## 2019-09-04 ENCOUNTER — Encounter: Payer: Self-pay | Admitting: Family Medicine

## 2019-09-04 ENCOUNTER — Ambulatory Visit (INDEPENDENT_AMBULATORY_CARE_PROVIDER_SITE_OTHER): Payer: Federal, State, Local not specified - PPO

## 2019-09-04 DIAGNOSIS — Z23 Encounter for immunization: Secondary | ICD-10-CM

## 2019-09-07 ENCOUNTER — Other Ambulatory Visit: Payer: Self-pay

## 2019-09-07 ENCOUNTER — Ambulatory Visit
Admission: RE | Admit: 2019-09-07 | Discharge: 2019-09-07 | Disposition: A | Discharge status: Home / Self Care | Payer: Federal, State, Local not specified - PPO | Source: Ambulatory Visit | Attending: Obstetrics and Gynecology | Admitting: Obstetrics and Gynecology

## 2019-09-07 DIAGNOSIS — Z1231 Encounter for screening mammogram for malignant neoplasm of breast: Secondary | ICD-10-CM

## 2019-09-10 ENCOUNTER — Telehealth: Payer: Self-pay | Admitting: Physical Therapy

## 2019-09-10 NOTE — Telephone Encounter (Signed)
Copied from [id number] [id number]. Topic: General - Inquiry >> Sep 10, 2019 10:30 AM Rayann Heman wrote: Reason for CRM: pt called and stated that she would like a call back from the nurse. Pt states at last appointment she left BP reading and would like to follow up on that. Please advise

## 2019-09-11 NOTE — Telephone Encounter (Signed)
Ok with those range BPs. Would like for her to keep monitoring and let us know if persistently 140/90 or higher.  Algis Greenhouse. Jerline Pain, MD 09/11/2019 3:22 PM

## 2019-09-11 NOTE — Telephone Encounter (Signed)
139/83/ 143/85 / 11/19 --- 11/20 131/88 FYI BP reading

## 2019-09-14 NOTE — Telephone Encounter (Signed)
Patient notified

## 2019-10-27 ENCOUNTER — Encounter: Payer: Self-pay | Admitting: Family Medicine

## 2019-10-27 ENCOUNTER — Other Ambulatory Visit: Payer: Self-pay

## 2019-10-27 ENCOUNTER — Ambulatory Visit (INDEPENDENT_AMBULATORY_CARE_PROVIDER_SITE_OTHER): Payer: Medicare Other | Admitting: Family Medicine

## 2019-10-27 VITALS — BP 138/72 | HR 63 | Temp 98.3°F | Ht 60.0 in | Wt 226.5 lb

## 2019-10-27 DIAGNOSIS — E785 Hyperlipidemia, unspecified: Secondary | ICD-10-CM | POA: Diagnosis not present

## 2019-10-27 DIAGNOSIS — M47817 Spondylosis without myelopathy or radiculopathy, lumbosacral region: Secondary | ICD-10-CM

## 2019-10-27 DIAGNOSIS — R739 Hyperglycemia, unspecified: Secondary | ICD-10-CM | POA: Diagnosis not present

## 2019-10-27 DIAGNOSIS — Z0001 Encounter for general adult medical examination with abnormal findings: Secondary | ICD-10-CM

## 2019-10-27 LAB — COMPREHENSIVE METABOLIC PANEL
ALT: 10 U/L (ref 0–35)
AST: 14 U/L (ref 0–37)
Albumin: 4.2 g/dL (ref 3.5–5.2)
Alkaline Phosphatase: 100 U/L (ref 39–117)
BUN: 9 mg/dL (ref 6–23)
CO2: 30 mEq/L (ref 19–32)
Calcium: 9.5 mg/dL (ref 8.4–10.5)
Chloride: 101 mEq/L (ref 96–112)
Creatinine, Ser: 0.62 mg/dL (ref 0.40–1.20)
GFR: 116.92 mL/min (ref >60.00)
Glucose, Bld: 99 mg/dL (ref 70–99)
Potassium: 3.9 mEq/L (ref 3.5–5.1)
Sodium: 140 mEq/L (ref 135–145)
Total Bilirubin: 0.4 mg/dL (ref 0.2–1.2)
Total Protein: 7 g/dL (ref 6.0–8.3)

## 2019-10-27 LAB — HEMOGLOBIN A1C: Hgb A1c MFr Bld: 6 % (ref 4.6–6.5)

## 2019-10-27 LAB — LIPID PANEL
Cholesterol: 266 mg/dL — ABNORMAL HIGH (ref 0–200)
HDL: 77.3 mg/dL (ref >39.00)
LDL Cholesterol: 167 mg/dL — ABNORMAL HIGH (ref 0–99)
NonHDL: 188.22
Total CHOL/HDL Ratio: 3
Triglycerides: 104 mg/dL (ref 0.0–149.0)
VLDL: 20.8 mg/dL (ref 0.0–40.0)

## 2019-10-27 LAB — TSH: TSH: 1.55 u[IU]/mL (ref 0.35–4.50)

## 2019-10-27 LAB — CBC
HCT: 38.1 % (ref 36.0–46.0)
Hemoglobin: 12.5 g/dL (ref 12.0–15.0)
MCHC: 32.9 g/dL (ref 30.0–36.0)
MCV: 90.7 fl (ref 78.0–100.0)
Platelets: 404 10*3/uL — ABNORMAL HIGH (ref 150.0–400.0)
RBC: 4.2 Mil/uL (ref 3.87–5.11)
RDW: 14.4 % (ref 11.5–15.5)
WBC: 8 10*3/uL (ref 4.0–10.5)

## 2019-10-27 MED ORDER — FLUOCINONIDE 0.05 % EX OINT: TOPICAL_OINTMENT | CUTANEOUS | 3 refills | Status: DC

## 2019-10-27 NOTE — Progress Notes (Signed)
Chief Complaint:  Susan [name] is a 64 y.o. female who presents today for her annual comprehensive physical exam.    Assessment/Plan:  Chronic Problems Addressed Today: # Essential hypertension - On HCTZ 80m daily. - A/P: At goal today however home readings are elevated.  She will bring her cuff in to compare with ours.  If readings match will need to increase therapy.  #Dyslipidemia - Not currently on any medications - A/P: Check lipids, CBC, c-Met.  % Alopecia - Follows with  dermatology. - Uses topical fluocinonide ointment three times weekly - A/P: Stable. Continue current treatment plan.   % Chronic Low Back Pain - Follows with orthopedics - A/P: We will place order for MRI per request.  Preventative Healthcare: UTD on vaccines and screenings.   Patient Counseling(The following topics were reviewed and/or handout was given):  -Nutrition: Stressed importance of moderation in sodium/caffeine intake, saturated fat and cholesterol, caloric balance, sufficient intake of fresh fruits, vegetables, and fiber.  -Stressed the importance of regular exercise.   -Substance Abuse: Discussed cessation/primary prevention of tobacco, alcohol, or other drug use; driving or other dangerous activities under the influence; availability of treatment for abuse.   -Injury prevention: Discussed safety belts, safety helmets, smoke detector, smoking near bedding or upholstery.   -Sexuality: Discussed sexually transmitted diseases, partner selection, use of condoms, avoidance of unintended pregnancy and contraceptive alternatives.   -Dental health: Discussed importance of regular tooth brushing, flossing, and dental visits.  -Health maintenance and immunizations reviewed. Please refer to Health maintenance section.  Return to care in 1 year for next preventative visit.     Subjective:  HPI:  She has no acute complaints today.   Patient has a several year history of lower back  pain.  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  She has been following with orthopedics for the past several years.  Had MRI done several years ago which showed degenerative disc disease.  Pain seems to be worsening.  She needs a repeated MRI.  Her Worker's Comp. told her that it needed to come from her primary doctor.  Patient's home blood pressure [place] in the 140s to 150s.  She has been compliant with her medication without missed doses.  Lifestyle Diet: Tries to eat more fruits and vegetables. Limits salt intake.  Exercise: Limited due to back pain and covid.   Depression screen PHQ 2/9 10/27/2019  Decreased Interest 0  Down, Depressed, Hopeless 0  PHQ - 2 Score 0    Health Maintenance Due  Topic Date Due  . HIV Screening  11/22/1970     PMH:  The following were reviewed and entered/updated in epic: Past Medical History:  Diagnosis Date  . Allergy   . Anemia   . Anxiety state, unspecified   . Back injury   . HLD (hyperlipidemia)    intol of statins - follows with lipid clinic  . HTN (hypertension)   . Irritable bowel syndrome   . Lumbosacral spondylosis without myelopathy    improved with PT  . Morbid obesity [facility]    Patient Active Problem List   Diagnosis Date Noted  . Alopecia 08/21/2019  . Hemorrhoid 08/21/2019  . VENOUS INSUFFICIENCY 08/18/2010  . SPONDYLOSIS, LUMBAR 01/17/2009  . Dyslipidemia 11/26/2007  . Essential hypertension 11/25/2007   Past Surgical History:  Procedure Laterality Date  . ABSCESS DRAINAGE     abdominal  . CESAREAN SECTION  1977    Family History  Problem Relation  Age of Onset  . Breast cancer Mother   . Diabetes Mother   . Hypertension Mother   . Diabetes Father        on dialysis  . Kidney failure Father   . Colon cancer Maternal Grandmother     Medications- reviewed and updated Current Outpatient Medications  Medication Sig Dispense Refill  . fluocinonide ointment (LIDEX) 0.05 % Use three times weekly.  60 g 3  . hydrochlorothiazide (HYDRODIURIL) 25 MG tablet Take 1 tablet (25 mg total) by mouth daily. 90 tablet 3  . orphenadrine (NORFLEX) 100 MG tablet Take 100 mg by mouth as needed for muscle spasms.      No current facility-administered medications for this visit.    Allergies-reviewed and updated Allergies  Allergen Reactions  . Aspirin     Other reaction(s): Bleeding  . Avelox [Moxifloxacin Hcl In Nacl] Anaphylaxis    Pt c/o swelling of her throat and tongue  . Codeine     REACTION: causes her heart to race  . Crestor [Rosuvastatin Calcium]     Pt states INTOLERANT w/ muscle cramps  . Ibuprofen     Gastric bleeding  . Penicillins     REACTION: rash  . Pravastatin Sodium [Pravachol]     REACTION: intolerant= headache & insomnia  . Shellfish Allergy     Rash and itching  . Simvastatin     REACTION: intolerant= headache & insomnia    Social History   Socioeconomic History  . Marital status: Divorced    Spouse name: Not on file  . Number of children: 1  . Years of education: Not on file  . Highest education level: Not on file  Occupational History  . 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 Smoking status: Never Smoker  . Smokeless tobacco: Never Used  Substance and Sexual Activity  . Alcohol use: No  . Drug use: No  . Sexual activity: Not on file  Other Topics Concern  . 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 Difficulty of Paying Living Expenses: Not on file  Food Insecurity:   . Worried About Charity fundraiser in the Last Year: Not on file  . Ran Out of Food in the Last Year: Not on file  Transportation Needs:   . Lack of Transportation (Medical): Not on file  . Lack of Transportation (Non-Medical): Not on file  Physical Activity:   . Days  of Exercise per Week: Not on file  . Minutes of Exercise per Session: Not on file  Stress:   . Feeling of Stress : Not on file  Social Connections:   . Frequency of Communication with Friends and Family: Not on file  . Frequency of Social Gatherings with Friends and Family: Not on file  . Attends Religious Services: Not on file  . Active Member of Clubs or Organizations: Not on file  . Attends Archivist Meetings: Not on file  . Marital Status: Not on file        Objective:  Physical Exam: BP 138/72   Pulse 63   Temp 98.3 F (36.8 C)   Ht 5' (1.524 m)   Wt 226 lb 8 oz (102.7  kg)   SpO2 99%   BMI 44.24 kg/m   Body mass index is 44.24 kg/m. Wt Readings from Last 3 Encounters:  10/27/19 226 lb 8 oz (102.7 kg)  08/21/19 221 lb 8 oz (100.5 kg)  11/12/17 234 lb (106.1 kg)   Gen: NAD, resting comfortably HEENT: TMs normal bilaterally. OP clear. No thyromegaly noted.  CV: RRR with no murmurs appreciated Pulm: NWOB, CTAB with no crackles, wheezes, or rhonchi GI: Normal bowel sounds present. Soft, Nontender, Nondistended. MSK: no edema, cyanosis, or clubbing noted Skin: warm, dry Neuro: CN2-12 grossly intact. Strength 5/5 in upper and lower extremities. Reflexes symmetric and intact bilaterally.  Psych: Normal affect and thought content     Korine Winton M. Jerline Pain, MD 10/27/2019 10:12 AM

## 2019-10-27 NOTE — Patient Instructions (Signed)
It was very nice to see you today!  Please come back in a few weeks with your home blood pressure cuff so that we can compare to ours.  We will place an order for your MRI.  We will check blood work today.  Come back in 1 year for your next checkup, or sooner if needed.  Take care, Dr Jerline Pain  Please try these tips to maintain a healthy lifestyle:   Eat at least 3 REAL meals and 1-2 snacks per day.  Aim for no more than 5 hours between eating.  If you eat breakfast, please do so within one hour of getting up.    Each meal should contain half fruits/vegetables, one quarter protein, and one quarter carbs (no bigger than a computer mouse)   Cut down on sweet beverages. This includes juice, soda, and sweet tea.     Drink at least 1 glass of water with each meal and aim for at least 8 glasses per day   Exercise at least 150 minutes every week.   Preventive Care 65-65 Years Old, Female Preventive care refers to visits with your health care provider and lifestyle choices that can promote health and wellness. This includes:  A yearly physical exam. This may also be called an annual well check.  Regular dental visits and eye exams.  Immunizations.  Screening for certain conditions.  Healthy lifestyle choices, such as eating a healthy diet, getting regular exercise, not using drugs or products that contain nicotine and tobacco, and limiting alcohol use. What can I expect for my preventive care visit? Physical exam Your health care provider will check your:  Height and weight. This may be used to calculate body mass index (BMI), which tells if you are at a healthy weight.  Heart rate and blood pressure.  Skin for abnormal spots. Counseling Your health care provider may ask you questions about your:  Alcohol, tobacco, and drug use.  Emotional well-being.  Home and relationship well-being.  Sexual activity.  Eating habits.  Work and work Statistician.  Method of  birth control.  Menstrual cycle.  Pregnancy history. What immunizations do I need?  Influenza (flu) vaccine  This is recommended every year. Tetanus, diphtheria, and pertussis (Tdap) vaccine  You may need a Td booster every 10 years. Varicella (chickenpox) vaccine  You may need this if you have not been vaccinated. Zoster (shingles) vaccine  You may need this after age 44. Measles, mumps, and rubella (MMR) vaccine  You may need at least one dose of MMR if you were born in 1957 or later. You may also need a second dose. Pneumococcal conjugate (PCV13) vaccine  You may need this if you have certain conditions and were not previously vaccinated. Pneumococcal polysaccharide (PPSV23) vaccine  You may need one or two doses if you smoke cigarettes or if you have certain conditions. Meningococcal conjugate (MenACWY) vaccine  You may need this if you have certain conditions. Hepatitis A vaccine  You may need this if you have certain conditions or if you travel or work in places where you may be exposed to hepatitis A. Hepatitis B vaccine  You may need this if you have certain conditions or if you travel or work in places where you may be exposed to hepatitis B. Haemophilus influenzae type b (Hib) vaccine  You may need this if you have certain conditions. Human papillomavirus (HPV) vaccine  If recommended by your health care provider, you may need three doses over 6 months. You  may receive vaccines as individual doses or as more than one vaccine together in one shot (combination vaccines). Talk with your health care provider about the risks and benefits of combination vaccines. What tests do I need? Blood tests  Lipid and cholesterol levels. These may be checked every 5 years, or more frequently if you are over 65 years old.  Hepatitis C test.  Hepatitis B test. Screening  Lung cancer screening. You may have this screening every year starting at age 65 if you have a  30-pack-year history of smoking and currently smoke or have quit within the past 15 years.  Colorectal cancer screening. All adults should have this screening starting at age 65 and continuing until age 30. Your health care provider may recommend screening at age 65 if you are at increased risk. You will have tests every 1-10 years, depending on your results and the type of screening test.  Diabetes screening. This is done by checking your blood sugar (glucose) after you have not eaten for a while (fasting). You may have this done every 1-3 years.  Mammogram. This may be done every 1-2 years. Talk with your health care provider about when you should start having regular mammograms. This may depend on whether you have a family history of breast cancer.  BRCA-related cancer screening. This may be done if you have a family history of breast, ovarian, tubal, or peritoneal cancers.  Pelvic exam and Pap test. This may be done every 3 years starting at age 65. Starting at age 29, this may be done every 5 years if you have a Pap test in combination with an HPV test. Other tests  Sexually transmitted disease (STD) testing.  Bone density scan. This is done to screen for osteoporosis. You may have this scan if you are at high risk for osteoporosis. Follow these instructions at home: Eating and drinking  Eat a diet that includes fresh fruits and vegetables, whole grains, lean protein, and low-fat dairy.  Take vitamin and mineral supplements as recommended by your health care provider.  Do not drink alcohol if: ? Your health care provider tells you not to drink. ? You are pregnant, may be pregnant, or are planning to become pregnant.  If you drink alcohol: ? Limit how much you have to 0-1 drink a day. ? Be aware of how much alcohol is in your drink. In the [place] one drink equals one 12 oz bottle of beer (355 mL), one 5 oz glass of wine (148 mL), or one 1 oz glass of hard liquor (44  mL). Lifestyle  Take daily care of your teeth and gums.  Stay active. Exercise for at least 30 minutes on 5 or more days each week.  Do not use any products that contain nicotine or tobacco, such as cigarettes, e-cigarettes, and chewing tobacco. If you need help quitting, ask your health care provider.  If you are sexually active, practice safe sex. Use a condom or other form of birth control (contraception) in order to prevent pregnancy and STIs (sexually transmitted infections).  If told by your health care provider, take low-dose aspirin daily starting at age 15. What's next?  Visit your health care provider once a year for a well check visit.  Ask your health care provider how often you should have your eyes and teeth checked.  Stay up to date on all vaccines. This information is not intended to replace advice given to you by your health care provider. Make sure you  discuss any questions you have with your health care provider. Document Revised: 06/19/2018 Document Reviewed: 06/19/2018 Elsevier Patient Education  2020 [employer].

## 2019-10-28 NOTE — Progress Notes (Signed)
Please inform patient of the following:  Cholesterol is elevated since last time but all of her other labs are stable compared to last year. Recommend starting atorvastatin 40mg  daily if she is willing to try another statin to improve cholesterol numbers and lower risk of heart attack and stroke. If not recommend she continue to work on diet and exercise and we can recheck in a year.

## 2019-10-29 ENCOUNTER — Telehealth: Payer: Self-pay | Admitting: Family Medicine

## 2019-10-29 ENCOUNTER — Other Ambulatory Visit: Payer: Self-pay

## 2019-10-29 MED ORDER — ALPRAZOLAM 0.5 MG PO TABS: 0.5000 mg | ORAL_TABLET | Freq: Two times a day (BID) | ORAL | 0 refills | Status: DC | PRN

## 2019-10-29 NOTE — Addendum Note (Signed)
Addended by: Vivi Barrack on: 10/29/2019 01:23 PM   Modules accepted: Orders

## 2019-10-29 NOTE — Telephone Encounter (Signed)
Pt called asking if Dr. Jerline Pain could prescribe her Susan [name] before her MRI on Monday 1/11 due to being claustrophobic. Please advise.

## 2019-10-29 NOTE — Telephone Encounter (Signed)
Please advise 

## 2019-10-29 NOTE — Telephone Encounter (Signed)
Patient notified

## 2019-10-30 ENCOUNTER — Other Ambulatory Visit: Payer: Self-pay

## 2019-10-30 MED ORDER — ATORVASTATIN CALCIUM 40 MG PO TABS: 40.0000 mg | ORAL_TABLET | Freq: Every day | ORAL | 3 refills | Status: DC

## 2019-11-04 ENCOUNTER — Ambulatory Visit: Payer: Medicare Other

## 2019-11-04 VITALS — BP 156/87 | HR 68

## 2019-11-04 DIAGNOSIS — I10 Essential (primary) hypertension: Secondary | ICD-10-CM

## 2019-11-04 NOTE — Progress Notes (Signed)
The home BP readings have been in the 158's / 89's range. Today BP was 156/87 P 68 taken by Braydyn Schultes A. Patient BP home monitor done today in office was 148/91 P 67, was taken 3 minutes after office reading.

## 2019-11-19 ENCOUNTER — Encounter: Payer: Self-pay | Admitting: Family Medicine

## 2019-11-23 ENCOUNTER — Other Ambulatory Visit: Payer: Self-pay

## 2019-11-23 MED ORDER — LOSARTAN POTASSIUM 100 MG PO TABS: 100.0000 mg | ORAL_TABLET | Freq: Every day | ORAL | 2 refills | Status: DC

## 2019-11-23 NOTE — Telephone Encounter (Signed)
Left voice message to call clinic 

## 2019-11-30 ENCOUNTER — Telehealth: Payer: Self-pay | Admitting: Family Medicine

## 2019-11-30 NOTE — Telephone Encounter (Signed)
Please call pt to schedule appt to discuss BP

## 2019-11-30 NOTE — Telephone Encounter (Signed)
Patient stated she stopped taking medication,she is not having symptoms,improved.

## 2019-11-30 NOTE — Telephone Encounter (Signed)
Please ask pt to schedule visit to discuss BP.  Algis Greenhouse. Jerline Pain, MD 11/30/2019 12:28 PM

## 2019-11-30 NOTE — Telephone Encounter (Signed)
Patient is on the schedule for 12/01/19

## 2019-11-30 NOTE — Telephone Encounter (Signed)
Patient called in and stated that the new blood pressure medication losartan (COZAAR) 100 MG tablet she is having side effects from it foggy vision, Swelling in her hand and back pain. Patient would like a call back to discuss a different medication. Please advise.

## 2019-12-01 ENCOUNTER — Other Ambulatory Visit: Payer: Self-pay

## 2019-12-01 ENCOUNTER — Ambulatory Visit (INDEPENDENT_AMBULATORY_CARE_PROVIDER_SITE_OTHER): Payer: Medicare Other | Admitting: Family Medicine

## 2019-12-01 DIAGNOSIS — I10 Essential (primary) hypertension: Secondary | ICD-10-CM | POA: Diagnosis not present

## 2019-12-01 DIAGNOSIS — M47817 Spondylosis without myelopathy or radiculopathy, lumbosacral region: Secondary | ICD-10-CM

## 2019-12-01 MED ORDER — AMLODIPINE BESYLATE 2.5 MG PO TABS: 2.5000 mg | ORAL_TABLET | Freq: Every day | ORAL | 3 refills | Status: DC

## 2019-12-01 NOTE — Assessment & Plan Note (Signed)
Will place referral to orthopedics per request.  Symptoms are stable.

## 2019-12-01 NOTE — Progress Notes (Signed)
   Arissa Ellianna Dudzinski is a 64 y.o. female who presents today for a virtual office visit.  Assessment/Plan:  Chronic Problems Addressed Today: SPONDYLOSIS, LUMBAR Will place referral to orthopedics per request.  Symptoms are stable.  Essential hypertension Was not able to tolerate losartan due to side effects.  Will continue HCTZ 25 mg daily.  Start low-dose amlodipine 2.5 mg daily.  She will follow-up with me in a few weeks via MyChart.     Subjective:  HPI:  Patient seen about a month ago.  Was started losartan due to elevated blood pressure readings.  She has stopped it due to developing side effects including back pain and blurred vision.  She has been checking blood pressures at home and they have been in the 130s over 80s.       Objective/Observations  Physical Exam: Gen: NAD, resting comfortably Pulm: Normal work of breathing Neuro: Grossly normal, moves all extremities Psych: Normal affect and thought content  Virtual Visit via Video   I connected with Sheria Lang on 12/01/19 at 11:00 AM EST by a video enabled telemedicine application and verified that I am speaking with the correct person using two identifiers. The limitations of evaluation and management by telemedicine and the availability of in person appointments were discussed. The patient expressed understanding and agreed to proceed.   Patient location: Home Provider location: [place] participating in the virtual visit: Myself and Patient     Algis Greenhouse. Jerline Pain, MD 12/01/2019 11:33 AM

## 2019-12-01 NOTE — Assessment & Plan Note (Signed)
Was not able to tolerate losartan due to side effects.  Will continue HCTZ 25 mg daily.  Start low-dose amlodipine 2.5 mg daily.  She will follow-up with me in a few weeks via MyChart.

## 2019-12-07 ENCOUNTER — Other Ambulatory Visit: Payer: Self-pay | Admitting: Family Medicine

## 2019-12-07 DIAGNOSIS — M47817 Spondylosis without myelopathy or radiculopathy, lumbosacral region: Secondary | ICD-10-CM

## 2019-12-24 ENCOUNTER — Encounter: Payer: Self-pay | Admitting: Family Medicine

## 2020-01-26 DIAGNOSIS — Z779 Other contact with and (suspected) exposures hazardous to health: Secondary | ICD-10-CM | POA: Diagnosis not present

## 2020-01-26 DIAGNOSIS — Z01419 Encounter for gynecological examination (general) (routine) without abnormal findings: Secondary | ICD-10-CM | POA: Diagnosis not present

## 2020-01-27 ENCOUNTER — Other Ambulatory Visit: Payer: Self-pay | Admitting: Obstetrics and Gynecology

## 2020-01-27 DIAGNOSIS — Z780 Asymptomatic menopausal state: Secondary | ICD-10-CM

## 2020-02-23 ENCOUNTER — Encounter: Payer: Self-pay | Admitting: Family Medicine

## 2020-02-23 ENCOUNTER — Other Ambulatory Visit: Payer: Self-pay

## 2020-02-23 ENCOUNTER — Ambulatory Visit (INDEPENDENT_AMBULATORY_CARE_PROVIDER_SITE_OTHER): Payer: Medicare Other | Admitting: Family Medicine

## 2020-02-23 VITALS — BP 120/82 | HR 70 | Temp 98.3°F | Ht 60.0 in | Wt 224.2 lb

## 2020-02-23 DIAGNOSIS — E785 Hyperlipidemia, unspecified: Secondary | ICD-10-CM

## 2020-02-23 DIAGNOSIS — I10 Essential (primary) hypertension: Secondary | ICD-10-CM

## 2020-02-23 DIAGNOSIS — M47817 Spondylosis without myelopathy or radiculopathy, lumbosacral region: Secondary | ICD-10-CM | POA: Diagnosis not present

## 2020-02-23 MED ORDER — EZETIMIBE 10 MG PO TABS: 10.0000 mg | ORAL_TABLET | Freq: Every day | ORAL | 3 refills | Status: DC

## 2020-02-23 NOTE — Assessment & Plan Note (Signed)
Did not tolerate statin due to headaches.  Will start Zetia.  Recheck lipid panel with next blood draw.

## 2020-02-23 NOTE — Progress Notes (Signed)
   Susan [name] is a 64 y.o. female who presents today for an office visit.  Assessment/Plan:  Chronic Problems Addressed Today: Essential hypertension At goal today.  Her home cuff reads about 30 points higher than our cuff.  Is likely that her home readings are actually at goal and her home cuff is not accurate.  We will continue current regimen of HCTZ 25 mg daily and amlodipine 2.5 mg daily.  Dyslipidemia Did not tolerate statin due to headaches.  Will start Zetia.  Recheck lipid panel with next blood draw.  SPONDYLOSIS, LUMBAR Okay for her to use low-dose gabapentin at night.     Subjective:  HPI:  Patient here for follow up. She was last seen couple months ago.  She had increased her blood pressure medication regimen to include Norvasc 2.5 mg daily.  She has been tolerating this well.  Home readings typically in the 140s to 150s though she is not sure if her cuff is been accurate.  She stopped taking Lipitor due to headaches.  She is interested in possibly starting Zetia as was recommended to her by her insurance representative.  She has also been having some bilateral hand and foot [name].  She is currently seeing [name] management for this.  They recommended she start gabapentin 100 mg nightly.       Objective:  Physical Exam: BP 120/82 (BP Location: Left Leg, Patient Position: Sitting, Cuff Size: Large)   Pulse 70   Temp 98.3 F (36.8 C) (Temporal)   Ht 5' (1.524 m)   Wt 224 lb 3.2 oz (101.7 kg)   SpO2 97%   BMI 43.79 kg/m   Gen: No acute distress, resting comfortably CV: Regular rate and rhythm with no murmurs appreciated Pulm: Normal work of breathing, clear to auscultation bilaterally with no crackles, wheezes, or rhonchi Neuro: Grossly normal, moves all extremities Psych: Normal affect and thought content      Susan [name] M. Jerline Pain, MD 02/23/2020 10:08 AM

## 2020-02-23 NOTE — Assessment & Plan Note (Signed)
At goal today.  Her home cuff reads about 30 points higher than our cuff.  Is likely that her home readings are actually at goal and her home cuff is not accurate.  We will continue current regimen of HCTZ 25 mg daily and amlodipine 2.5 mg daily.

## 2020-02-23 NOTE — Patient Instructions (Signed)
It was very nice to see you today!  We will stop the Lipitor.  You can start Zetia.  I will send in a prescription.  Your blood pressure looks great today.  Please continue your current medications.  I am okay with you trying low-dose gabapentin.  Come back to see me in 6 months for your next checkup, or sooner if needed.  Take care, Dr Jerline Pain  Please try these tips to maintain a healthy lifestyle:   Eat at least 3 REAL meals and 1-2 snacks per day.  Aim for no more than 5 hours between eating.  If you eat breakfast, please do so within one hour of getting up.    Each meal should contain half fruits/vegetables, one quarter protein, and one quarter carbs (no bigger than a computer mouse)   Cut down on sweet beverages. This includes juice, soda, and sweet tea.     Drink at least 1 glass of water with each meal and aim for at least 8 glasses per day   Exercise at least 150 minutes every week.

## 2020-02-23 NOTE — Assessment & Plan Note (Signed)
Okay for her to use low-dose gabapentin at night.

## 2020-03-04 ENCOUNTER — Ambulatory Visit
Admission: RE | Admit: 2020-03-04 | Discharge: 2020-03-04 | Disposition: A | Discharge status: Home / Self Care | Payer: Medicare Other | Source: Ambulatory Visit | Attending: Obstetrics and Gynecology | Admitting: Obstetrics and Gynecology

## 2020-03-04 ENCOUNTER — Other Ambulatory Visit: Payer: Self-pay

## 2020-03-04 DIAGNOSIS — Z780 Asymptomatic menopausal state: Secondary | ICD-10-CM

## 2020-03-04 DIAGNOSIS — M810 Age-related osteoporosis without current pathological fracture: Secondary | ICD-10-CM | POA: Diagnosis not present

## 2020-03-07 NOTE — Progress Notes (Signed)
Please inform patient of the following:  Her bone density scan shows that she has mild osteoporosis - she needs to follow up here or with her OBGYN to discuss treatment options.  Algis Greenhouse. Jerline Pain, MD 03/07/2020 8:05 AM

## 2020-03-15 ENCOUNTER — Encounter: Payer: Self-pay | Admitting: Family Medicine

## 2020-03-15 ENCOUNTER — Ambulatory Visit (INDEPENDENT_AMBULATORY_CARE_PROVIDER_SITE_OTHER): Payer: Medicare Other | Admitting: Family Medicine

## 2020-03-15 ENCOUNTER — Other Ambulatory Visit: Payer: Self-pay

## 2020-03-15 VITALS — BP 122/74 | HR 68 | Temp 98.4°F | Ht 60.0 in | Wt 224.2 lb

## 2020-03-15 DIAGNOSIS — M810 Age-related osteoporosis without current pathological fracture: Secondary | ICD-10-CM | POA: Insufficient documentation

## 2020-03-15 DIAGNOSIS — I10 Essential (primary) hypertension: Secondary | ICD-10-CM | POA: Diagnosis not present

## 2020-03-15 NOTE — Assessment & Plan Note (Signed)
At goal.  Continue HCTZ 25 mg daily and amlodipine 2.5 mg daily.  She has adjusted the way that she checks her home blood pressure readings and has found that her home readings are the same as the readings here.

## 2020-03-15 NOTE — Patient Instructions (Signed)
It was very nice to see you today!  Your blood pressure looks great today.  No changes to your blood pressure meds.  Please keep an eye on periodically at home.  You have a very mild osteoporosis.  Please make sure that you are getting at least 1200 mg of calcium and 800 international units of vitamin D.  We should recheck again in 2 years.  I will see you back in 6 months, or sooner as needed.   Take care, Dr Jerline Pain  Please try these tips to maintain a healthy lifestyle:   Eat at least 3 REAL meals and 1-2 snacks per day.  Aim for no more than 5 hours between eating.  If you eat breakfast, please do so within one hour of getting up.    Each meal should contain half fruits/vegetables, one quarter protein, and one quarter carbs (no bigger than a computer mouse)   Cut down on sweet beverages. This includes juice, soda, and sweet tea.     Drink at least 1 glass of water with each meal and aim for at least 8 glasses per day   Exercise at least 150 minutes every week.

## 2020-03-15 NOTE — Progress Notes (Signed)
   Susan [name] is a 64 y.o. female who presents today for an office visit.  Assessment/Plan:  Chronic Problems Addressed Today: Essential hypertension At goal.  Continue HCTZ 25 mg daily and amlodipine 2.5 mg daily.  She has adjusted the way that she checks her home blood pressure readings and has found that her home readings are the same as the readings here.  Osteoporosis with last DEXA 02/2020 Discussed treatment options.  She does not want to start bisphosphonates at this point.  Recommended optimizing calcium and vitamin D with at least 1200 mg of calcium and 800 international units of vitamin D daily.  We will repeat DEXA in 2023.     Subjective:  HPI:  Patient seen 3 weeks ago.  At that time she was concerned to have elevated blood pressure reading but blood pressure in office was normal.  He continued her medications of HCTZ 25 mg daily and amlodipine 2.5 mg daily.  She would also like to discuss bone density scan.  Recently had DXA scan and found to have T score of -2.5 for the left femur neck.       Objective:  Physical Exam: BP 122/74 (BP Location: Left Arm, Patient Position: Sitting, Cuff Size: Large)   Pulse 68   Temp 98.4 F (36.9 C) (Temporal)   Ht 5' (1.524 m)   Wt 224 lb 3.2 oz (101.7 kg)   SpO2 97%   BMI 43.79 kg/m   Gen: No acute distress, resting comfortably Neuro: Grossly normal, moves all extremities Psych: Normal affect and thought content      Daniyah Fohl M. Jerline Pain, MD 03/15/2020 1:55 PM

## 2020-03-15 NOTE — Assessment & Plan Note (Signed)
Discussed treatment options.  She does not want to start bisphosphonates at this point.  Recommended optimizing calcium and vitamin D with at least 1200 mg of calcium and 800 international units of vitamin D daily.  We will repeat DEXA in 2023.

## 2020-04-13 ENCOUNTER — Other Ambulatory Visit: Payer: Federal, State, Local not specified - PPO

## 2020-07-06 ENCOUNTER — Ambulatory Visit (INDEPENDENT_AMBULATORY_CARE_PROVIDER_SITE_OTHER): Payer: Medicare Other

## 2020-07-06 ENCOUNTER — Other Ambulatory Visit: Payer: Self-pay

## 2020-07-06 DIAGNOSIS — Z23 Encounter for immunization: Secondary | ICD-10-CM

## 2020-07-27 ENCOUNTER — Other Ambulatory Visit: Payer: Self-pay | Admitting: Family Medicine

## 2020-07-27 DIAGNOSIS — Z1231 Encounter for screening mammogram for malignant neoplasm of breast: Secondary | ICD-10-CM

## 2020-08-12 ENCOUNTER — Other Ambulatory Visit: Payer: Self-pay | Admitting: Family Medicine

## 2020-08-12 ENCOUNTER — Telehealth: Payer: Self-pay

## 2020-08-12 NOTE — Telephone Encounter (Signed)
  LAST APPOINTMENT DATE: 03/15/2020  NEXT APPOINTMENT DATE:@11 /01/2020  MEDICATION: hydrochlorothiazide (HYDRODIURIL) 25 MG tablet  PHARMACY: [id number] - [place], [place] - [address]  Please advise

## 2020-08-15 NOTE — Telephone Encounter (Signed)
Rx was send on 08/12/2020

## 2020-08-16 ENCOUNTER — Ambulatory Visit: Payer: Federal, State, Local not specified - PPO

## 2020-08-25 ENCOUNTER — Other Ambulatory Visit: Payer: Self-pay

## 2020-08-25 ENCOUNTER — Encounter: Payer: Self-pay | Admitting: Family Medicine

## 2020-08-25 ENCOUNTER — Ambulatory Visit (INDEPENDENT_AMBULATORY_CARE_PROVIDER_SITE_OTHER): Payer: Medicare Other | Admitting: Family Medicine

## 2020-08-25 VITALS — BP 131/78 | HR 72 | Temp 98.1°F | Ht 60.0 in | Wt 228.6 lb

## 2020-08-25 DIAGNOSIS — E785 Hyperlipidemia, unspecified: Secondary | ICD-10-CM

## 2020-08-25 DIAGNOSIS — I10 Essential (primary) hypertension: Secondary | ICD-10-CM

## 2020-08-25 MED ORDER — AMLODIPINE BESYLATE 2.5 MG PO TABS: 2.5000 mg | ORAL_TABLET | Freq: Every day | ORAL | 3 refills | Status: DC

## 2020-08-25 MED ORDER — FLUOCINOLONE ACETONIDE BODY 0.01 % EX OIL
TOPICAL_OIL | CUTANEOUS | 1 refills | Status: DC
Start: 2020-08-25 — End: 2021-01-30

## 2020-08-25 MED ORDER — EZETIMIBE 10 MG PO TABS: 10.0000 mg | ORAL_TABLET | Freq: Every day | ORAL | 3 refills | Status: DC

## 2020-08-25 MED ORDER — HYDROCHLOROTHIAZIDE 25 MG PO TABS: 25.0000 mg | ORAL_TABLET | Freq: Every day | ORAL | 3 refills | Status: DC

## 2020-08-25 NOTE — Assessment & Plan Note (Signed)
Continue Zetia 10 mg daily.  Check lipids next blood draw.

## 2020-08-25 NOTE — Patient Instructions (Signed)
It was very nice to see you today!  Your blood pressure looks good today.  I will refill your medications.  You have a normal exam today.  Please make sure that you are staying well-hydrated and not going home.  The time between eating.  Please let me know if your headaches persist or if you have worsening abdominal pain.  I would like to see you back in 2 to 3 months to for your annual check up with blood work.  Please come back to see me sooner as needed.  Take care, Dr Jerline Pain  Please try these tips to maintain a healthy lifestyle:   Eat at least 3 REAL meals and 1-2 snacks per day.  Aim for no more than 5 hours between eating.  If you eat breakfast, please do so within one hour of getting up.    Each meal should contain half fruits/vegetables, one quarter protein, and one quarter carbs (no bigger than a computer mouse)   Cut down on sweet beverages. This includes juice, soda, and sweet tea.     Drink at least 1 glass of water with each meal and aim for at least 8 glasses per day   Exercise at least 150 minutes every week.

## 2020-08-25 NOTE — Progress Notes (Signed)
   Susan [name] is a 64 y.o. female who presents today for an office visit.  Assessment/Plan:  New/Acute Problems: Adjustment Reaction Related to recent passing of her mother.  Overall symptoms have been stable.   Abdominal Pain Benign abdominal exam today.  Reassured patient.  Advised her to avoid eating broccoli as this seems to be the only trigger and let me know if symptoms do not resolve.  Headaches No red flags.  Reassuring neuro exam today.  No current symptoms.  Likely secondary to dehydration.  Encourage good oral hydration.  Discussed reasons to return to care.  Chronic Problems Addressed Today: Essential hypertension At goal.  Continue amlodipine 2.5 mg daily and HCTZ 25 mg daily.  Check labs with next blood draw.  Dyslipidemia Continue Zetia 10 mg daily.  Check lipids next blood draw.   She will return in a couple of months for annual checkup with labs.    Subjective:  HPI:  Patient is here for follow-up.  Please see A/P for status of chronic conditions.  She unfortunately has been dealing with the passing of her mother for the past several weeks.  Overall feels like she is handling it well feels had much increased stress at home.  Has good support structure with family.  Symptoms seem to be improving.  She is having occasional lower abdominal pain as well.  Notices this only after eating broccoli.  No nausea or vomiting.  No constipation or diarrhea.  No melena or hematochezia.  She has also had a couple episodes of headaches and slight dizziness.  She usually associates this with being slightly dehydrated or when she has not long periods of time without eating.  No weakness or numbness.       Objective:  Physical Exam: BP 131/78   Pulse 72   Temp 98.1 F (36.7 C) (Temporal)   Ht 5' (1.524 m)   Wt 228 lb 9.6 oz (103.7 kg)   SpO2 99%   BMI 44.65 kg/m   Gen: No acute distress, resting comfortably CV: Regular rate and rhythm with no murmurs  appreciated Pulm: Normal work of breathing, clear to auscultation bilaterally with no crackles, wheezes, or rhonchi GI: s, NT, ND.  Neuro: Grossly normal, moves all extremities. CN 2 through 12 intact.  Strength 5 out of 5 in upper and lower extremities. Psych: Normal affect and thought content      Berkley Cronkright M. Jerline Pain, MD 08/25/2020 10:46 AM

## 2020-08-25 NOTE — Assessment & Plan Note (Signed)
At goal.  Continue amlodipine 2.5 mg daily and HCTZ 25 mg daily.  Check labs with next blood draw.

## 2020-09-08 ENCOUNTER — Ambulatory Visit: Payer: Federal, State, Local not specified - PPO

## 2020-09-08 ENCOUNTER — Other Ambulatory Visit: Payer: Self-pay

## 2020-09-08 ENCOUNTER — Telehealth (INDEPENDENT_AMBULATORY_CARE_PROVIDER_SITE_OTHER): Payer: Medicare Other | Admitting: Family Medicine

## 2020-09-08 DIAGNOSIS — R0981 Nasal congestion: Secondary | ICD-10-CM

## 2020-09-08 DIAGNOSIS — R059 Cough, unspecified: Secondary | ICD-10-CM

## 2020-09-08 MED ORDER — BENZONATATE 100 MG PO CAPS: 100.0000 mg | ORAL_CAPSULE | Freq: Three times a day (TID) | ORAL | 0 refills | Status: DC | PRN

## 2020-09-08 MED ORDER — ALBUTEROL SULFATE HFA 108 (90 BASE) MCG/ACT IN AERS: 2.0000 | INHALATION_SPRAY | Freq: Four times a day (QID) | RESPIRATORY_TRACT | 0 refills | Status: DC | PRN

## 2020-09-08 NOTE — Patient Instructions (Addendum)
 -  please stay home while sick, unless you need to seek medical care  -[place] COVID19 testing information: [email or web address] OR [phone] Most pharmacies also offer testing or home testing kits. Please let your primary doctor know if this test is positive.  -I sent the medication(s) we discussed to your pharmacy: Meds ordered this encounter  Medications  . benzonatate (TESSALON PERLES) 100 MG capsule    Sig: Take 1 capsule (100 mg total) by mouth 3 (three) times daily as needed.    Dispense:  20 capsule    Refill:  0  . albuterol (PROAIR HFA) 108 (90 Base) MCG/ACT inhaler    Sig: Inhale 2 puffs into the lungs every 6 (six) hours as needed for wheezing or shortness of breath.    Dispense:  1 each    Refill:  0    -can use tylenolif needed for fevers, aches and pains per instructions  -can use nasal saline a few times per day if nasal congestion  -stay hydrated, drink plenty of fluids and eat small healthy meals - avoid dairy  -can take 1000 IU Vit D3 and Vit C lozenges per instructions  -follow up with your doctor in 2-3 days unless improving and feeling better   It was nice to meet you today, and I really hope you are feeling better soon. I help [facility] out with telemedicine visits on Tuesdays and Thursdays and am available for visits on those days. If you have any concerns or questions following this visit please schedule a follow up visit with your Primary Care doctor or seek care at a local urgent care clinic to avoid delays in care.    Seek in person care promptly if your symptoms worsen, new concerns arise or you are not improving with treatment. Call 911 and/or seek emergency care if you symptoms are severe or life threatening.

## 2020-09-08 NOTE — Progress Notes (Signed)
Virtual Visit via Telephone Note  I connected with Sheria Lang on 09/08/20 at 11:00 AM EST by telephone and verified that I am speaking with the correct person using two identifiers.   I discussed the limitations, risks, security and privacy concerns of performing an evaluation and management service by telephone and the availability of in person appointments. I also discussed with the patient that there may be a patient responsible charge related to this service. The patient expressed understanding and agreed to proceed.  Location patient: home, [place] Location provider: work or home office Participants present for the call: patient, provider Patient did not have a visit with me in the prior 7 days to address this/these issue(s).   History of Present Illness:  Acute telemedicine visit for sinus issues: -Onset: 09/04/20 -Symptoms include:nasal congestion, sore throat, cough, wheezing one night - now resolved -Denies: fevers, CP, SOB, diarrhea, vomiting, loss of taste or smell, inability to get out of bed/eat or drink -Has tried:gargling, coricidin, throat lozenges -Pertinent past medical history: asthma remotely - does not have an inhaler -Pertinent medication allergies: see allergies -COVID-19 vaccine status: fully vaccinated + booster, had flu shot   Observations/Objective: Patient sounds cheerful and well on the phone. I do not appreciate any SOB. Speech and thought processing are grossly intact. Patient reported vitals:  Assessment and Plan:  Nasal congestion  Cough  -we discussed possible serious and likely etiologies, options for evaluation and workup, limitations of telemedicine visit vs in person visit, treatment, treatment risks and precautions. Pt prefers to treat via telemedicine empirically rather than in person at this moment.  Query viral upper respiratory infection versus other.  Discussed that flu and COVID-19 are less likely, though not impossible, given  her history and vaccination status.  Discussed options for testing.  She opted for treatment with Tessalon for the cough and a refill of albuterol inhaler in case this is needed given her history of use during respiratory illnesses.  Advised to seek prompt follow-up with her primary care doctor or in person care if worsening, new symptoms arise, or if is not improving with treatment. Advised of options for inperson care in case PCP office not available. Did let the patient know that I only do telemedicine shifts for [facility] on Tuesdays and Thursdays and advised a follow up visit with PCP or at an [facility] if has further questions or concerns.   Follow Up Instructions:  I did not refer this patient for an OV with me in the next 24 hours for this/these issue(s).  I discussed the assessment and treatment plan with the patient. The patient was provided an opportunity to ask questions and all were answered. The patient agreed with the plan and demonstrated an understanding of the instructions.   I spent  18  minutes on this encounter.   Lucretia Kern, DO

## 2020-09-09 ENCOUNTER — Other Ambulatory Visit: Payer: Medicare Other

## 2020-09-09 DIAGNOSIS — Z20822 Contact with and (suspected) exposure to covid-19: Secondary | ICD-10-CM

## 2020-09-10 ENCOUNTER — Encounter: Payer: Self-pay | Admitting: Family Medicine

## 2020-09-10 ENCOUNTER — Other Ambulatory Visit: Payer: Self-pay

## 2020-09-10 ENCOUNTER — Ambulatory Visit
Admission: EM | Admit: 2020-09-10 | Discharge: 2020-09-10 | Disposition: A | Discharge status: Home / Self Care | Payer: Medicare Other | Attending: Family Medicine | Admitting: Family Medicine

## 2020-09-10 DIAGNOSIS — J0101 Acute recurrent maxillary sinusitis: Secondary | ICD-10-CM | POA: Diagnosis not present

## 2020-09-10 LAB — SARS-COV-2, NAA 2 DAY TAT

## 2020-09-10 LAB — NOVEL CORONAVIRUS, NAA: SARS-CoV-2, NAA: NOT DETECTED

## 2020-09-10 MED ORDER — AZITHROMYCIN 250 MG PO TABS: ORAL_TABLET | ORAL | 0 refills | Status: DC

## 2020-09-10 NOTE — ED Provider Notes (Signed)
[name]    CSN: [id number] Arrival date & time: 09/10/20  1349      History   Chief Complaint No chief complaint on file.   HPI Susan [name] is a 64 y.o. female.   Initial [facility] visit  This is a 64 year old woman with sinus congestion.  She was evaluated through telemedicine just 2 days ago.  Onset of symptoms one week ago.  Patient reports yearly sinus infection at this time of year.  She usually needs an antibiotic to clear it up.  Has not been able to sleep well x 2 nights because of congestion.   Note from televisit two days ago: Acute telemedicine visit for sinus issues: -Onset: 09/04/20 -Symptoms include:nasal congestion, sore throat, cough, wheezing one night - now resolved -Denies: fevers, CP, SOB, diarrhea, vomiting, loss of taste or smell, inability to get out of bed/eat or drink -Has tried:gargling, coricidin, throat lozenges -Pertinent past medical history: asthma remotely - does not have an inhaler -Pertinent medication allergies: see allergies -COVID-19 vaccine status: fully vaccinated + booster, had flu shot  Patient was treated with Tessalon and inhaler.     Past Medical History:  Diagnosis Date  . Allergy   . Anemia   . Anxiety state, unspecified   . Back injury   . HLD (hyperlipidemia)    intol of statins - follows with lipid clinic  . HTN (hypertension)   . Irritable bowel syndrome   . Lumbosacral spondylosis without myelopathy    improved with PT  . Morbid obesity [facility])     Patient Active Problem List   Diagnosis Date Noted  . Osteoporosis with last DEXA 02/2020 03/15/2020  . Alopecia 08/21/2019  . Hemorrhoid 08/21/2019  . VENOUS INSUFFICIENCY 08/18/2010  . SPONDYLOSIS, LUMBAR 01/17/2009  . Dyslipidemia 11/26/2007  . Essential hypertension 11/25/2007    Past Surgical History:  Procedure Laterality Date  . ABSCESS DRAINAGE     abdominal  . CESAREAN SECTION  1977    OB History   No obstetric history  on file.      Home Medications    Prior to Admission medications   Medication Sig Start Date End Date Taking? Authorizing Provider  albuterol (PROAIR HFA) 108 (90 Base) MCG/ACT inhaler Inhale 2 puffs into the lungs every 6 (six) hours as needed for wheezing or shortness of breath. 09/08/20   Lucretia Kern, DO  amLODipine (NORVASC) 2.5 MG tablet Take 1 tablet (2.5 mg total) by mouth daily. 08/25/20   Vivi Barrack, MD  benzonatate (TESSALON PERLES) 100 MG capsule Take 1 capsule (100 mg total) by mouth 3 (three) times daily as needed. 09/08/20   Lucretia Kern, DO  diclofenac Sodium (VOLTAREN) 1 % GEL Apply 4 g to affected area up to 4 times daily. 08/24/19   [provider]  ezetimibe (ZETIA) 10 MG tablet Take 1 tablet (10 mg total) by mouth daily. 08/25/20   Vivi Barrack, MD  Fluocinolone Acetonide Body 0.01 % OIL Apply to scalp 3 times per week 08/25/20   Vivi Barrack, MD  hydrochlorothiazide (HYDRODIURIL) 25 MG tablet Take 1 tablet (25 mg total) by mouth daily. 08/25/20   Vivi Barrack, MD  orphenadrine (NORFLEX) 100 MG tablet Take 100 mg by mouth as needed for muscle spasms.  01/21/19   [provider]    Family History Family History  Problem Relation Age of Onset  . Breast cancer Mother   . Diabetes Mother   . Hypertension  Mother   . Multiple myeloma Mother   . Diabetes Father        on dialysis  . Kidney failure Father   . Colon cancer Maternal Grandmother     Social History Social History   Tobacco Use  . Smoking status: Never Smoker  . Smokeless tobacco: Never Used  Substance Use Topics  . Alcohol use: No  . Drug use: No     Allergies   Aspirin, Avelox [moxifloxacin hcl in nacl], Codeine, Crestor [rosuvastatin calcium], Ibuprofen, Penicillins, Pravastatin sodium [pravachol], Shellfish allergy, and Simvastatin   Review of Systems Review of Systems  Constitutional: Positive for fatigue. Negative for fever.  HENT: Positive for congestion and  sore throat.   Respiratory: Positive for cough.   Gastrointestinal: Negative.   Genitourinary: Negative.      Physical Exam Triage Vital Signs ED Triage Vitals  Enc Vitals Group     BP      Pulse      Resp      Temp      Temp src      SpO2      Weight      Height      Head Circumference      Peak Flow      Pain Score      Pain Loc      Pain Edu?      Excl. in [place]?    No data found.  Updated Vital Signs Pulse 65   Temp 97.9 F (36.6 C) (Oral)   Resp 20   SpO2 98%    Physical Exam Vitals and nursing note reviewed.  Constitutional:      Appearance: Normal appearance. She is obese.  HENT:     Head: Normocephalic.     Nose: Congestion present.     Mouth/Throat:     Mouth: Mucous membranes are moist.     Pharynx: Oropharynx is clear.  Eyes:     Conjunctiva/sclera: Conjunctivae normal.  Pulmonary:     Effort: Pulmonary effort is normal.  Musculoskeletal:        General: Normal range of motion.     Cervical back: Normal range of motion and neck supple.  Skin:    General: Skin is warm and dry.  Neurological:     General: No focal deficit present.     Mental Status: She is alert and oriented to person, place, and time.  Psychiatric:        Mood and Affect: Mood normal.        Behavior: Behavior normal.        Thought Content: Thought content normal.        Judgment: Judgment normal.      UC Treatments / Results  Labs (all labs ordered are listed, but only abnormal results are displayed) Labs Reviewed - No data to display  EKG   Radiology No results found.  Procedures Procedures (including critical care time)  Medications Ordered in UC Medications - No data to display  Initial Impression / Assessment and Plan / UC Course  I have reviewed the triage vital signs and the nursing notes.  Pertinent labs & imaging results that were available during my care of the patient were reviewed by me and considered in my medical decision making (see chart  for details).    Final Clinical Impressions(s) / UC Diagnoses   Final diagnoses:  None   Discharge Instructions   None    ED Prescriptions  None     I have reviewed the PDMP during this encounter.   Robyn Haber, MD 09/10/20 1408

## 2020-09-10 NOTE — ED Triage Notes (Signed)
Pt is here with sinus congestion that started a week ago, pt has taken OTC meds to relieve discomfort.

## 2020-09-10 NOTE — Discharge Instructions (Addendum)
Take Afrin (oxymetazoline) nasal spray one side or the other at night to open sinus passages.

## 2020-09-20 ENCOUNTER — Ambulatory Visit
Admission: RE | Admit: 2020-09-20 | Discharge: 2020-09-20 | Disposition: A | Discharge status: Home / Self Care | Payer: Medicare Other | Source: Ambulatory Visit | Attending: Family Medicine | Admitting: Family Medicine

## 2020-09-20 ENCOUNTER — Other Ambulatory Visit: Payer: Self-pay

## 2020-09-20 DIAGNOSIS — Z1231 Encounter for screening mammogram for malignant neoplasm of breast: Secondary | ICD-10-CM

## 2020-10-16 ENCOUNTER — Other Ambulatory Visit: Payer: Self-pay

## 2020-10-16 ENCOUNTER — Ambulatory Visit (INDEPENDENT_AMBULATORY_CARE_PROVIDER_SITE_OTHER): Payer: Medicare Other

## 2020-10-16 ENCOUNTER — Ambulatory Visit
Admission: EM | Admit: 2020-10-16 | Discharge: 2020-10-16 | Disposition: A | Discharge status: Home / Self Care | Payer: Medicare Other | Attending: Emergency Medicine | Admitting: Emergency Medicine

## 2020-10-16 DIAGNOSIS — M79644 Pain in right finger(s): Secondary | ICD-10-CM

## 2020-10-16 DIAGNOSIS — S62524A Nondisplaced fracture of distal phalanx of right thumb, initial encounter for closed fracture: Secondary | ICD-10-CM

## 2020-10-16 MED ORDER — TRAMADOL HCL 50 MG PO TABS: 50.0000 mg | ORAL_TABLET | Freq: Four times a day (QID) | ORAL | 0 refills | Status: DC | PRN

## 2020-10-16 NOTE — ED Triage Notes (Signed)
Patient states she was standing on the side of her bathtub to wipe something off and fell injuring her right thumb. Pt is aox4 and ambulatory.

## 2020-10-16 NOTE — Discharge Instructions (Addendum)
There is a small fracture at the base of your thumb Wear splint to limit movement Tylenol for mild to moderate pain Tramadol for severe pain Ice Follow-up with sports medicine to monitor healing

## 2020-10-16 NOTE — ED Provider Notes (Signed)
[name]    CSN: [id number] Arrival date & time: 10/16/20  4268      History   Chief Complaint Chief Complaint  Patient presents with   Finger Injury    Occurred on friday    HPI Susan [name] is a 64 y.o. female presenting today for evaluation of right hand injury.  Reports 2 days ago fell and injured her right thumb.  Believes that she jammed it, but since has had difficulty bending her thumb.  Denies pain in any other fingers.  Denies hitting head or other injury with fall.  Denies numbness or tingling.  Denies history of prior injury/fracture  HPI  Past Medical History:  Diagnosis Date   Allergy    Anemia    Anxiety state, unspecified    Back injury    HLD (hyperlipidemia)    intol of statins - follows with lipid clinic   HTN (hypertension)    Irritable bowel syndrome    Lumbosacral spondylosis without myelopathy    improved with PT   Morbid obesity [facility])     Patient Active Problem List   Diagnosis Date Noted   Osteoporosis with last DEXA 02/2020 03/15/2020   Alopecia 08/21/2019   Hemorrhoid 08/21/2019   VENOUS INSUFFICIENCY 08/18/2010   SPONDYLOSIS, LUMBAR 01/17/2009   Dyslipidemia 11/26/2007   Essential hypertension 11/25/2007    Past Surgical History:  Procedure Laterality Date   ABSCESS DRAINAGE     abdominal   CESAREAN SECTION  1977    OB History   No obstetric history on file.      Home Medications    Prior to Admission medications   Medication Sig Start Date End Date Taking? Authorizing Provider  albuterol (PROAIR HFA) 108 (90 Base) MCG/ACT inhaler Inhale 2 puffs into the lungs every 6 (six) hours as needed for wheezing or shortness of breath. 09/08/20  Yes Lucretia Kern, DO  orphenadrine (NORFLEX) 100 MG tablet Take 100 mg by mouth as needed for muscle spasms.  01/21/19  Yes [provider]  amLODipine (NORVASC) 2.5 MG tablet Take 1 tablet (2.5 mg total) by mouth daily. 08/25/20    Vivi Barrack, MD  diclofenac Sodium (VOLTAREN) 1 % GEL Apply 4 g to affected area up to 4 times daily. 08/24/19   [provider]  ezetimibe (ZETIA) 10 MG tablet Take 1 tablet (10 mg total) by mouth daily. 08/25/20   Vivi Barrack, MD  finasteride (PROSCAR) 5 MG tablet Take by mouth. 08/22/20   [provider]  Fluocinolone Acetonide Body 0.01 % OIL Apply to scalp 3 times per week 08/25/20   Vivi Barrack, MD  hydrochlorothiazide (HYDRODIURIL) 25 MG tablet Take 1 tablet (25 mg total) by mouth daily. 08/25/20   Vivi Barrack, MD  traMADol (ULTRAM) 50 MG tablet Take 1 tablet (50 mg total) by mouth every 6 (six) hours as needed. 10/16/20   Krystena Reitter, Elesa Hacker, PA-C    Family History Family History  Problem Relation Age of Onset   Breast cancer Mother    Diabetes Mother    Hypertension Mother    Multiple myeloma Mother    Diabetes Father        on dialysis   Kidney failure Father    Colon cancer Maternal Grandmother     Social History Social History   Tobacco Use   Smoking status: Never Smoker   Smokeless tobacco: Never Used  Scientific laboratory technician Use: Never used  Substance  Use Topics   Alcohol use: No   Drug use: No     Allergies   Aspirin, Avelox [moxifloxacin hcl in nacl], Shellfish allergy, Ibuprofen, Codeine, Crestor [rosuvastatin calcium], Penicillins, Pravastatin sodium [pravachol], and Simvastatin   Review of Systems Review of Systems  Constitutional: Negative for fatigue and fever.  Eyes: Negative for visual disturbance.  Respiratory: Negative for shortness of breath.   Cardiovascular: Negative for chest pain.  Gastrointestinal: Negative for abdominal pain, nausea and vomiting.  Musculoskeletal: Positive for arthralgias and joint swelling.  Skin: Negative for color change, rash and wound.  Neurological: Negative for dizziness, weakness, light-headedness and headaches.     Physical Exam Triage Vital Signs ED Triage Vitals  [10/16/20 1053]  Enc Vitals Group     BP (!) 155/87     Pulse Rate 73     Resp 16     Temp 98 F (36.7 C)     Temp Source Oral     SpO2 96 %     Weight      Height      Head Circumference      Peak Flow      Pain Score      Pain Loc      Pain Edu?      Excl. in [place]?    No data found.  Updated Vital Signs BP (!) 155/87 (BP Location: Left Arm)    Pulse 73    Temp 98 F (36.7 C) (Oral)    Resp 16    SpO2 96%   Visual Acuity Right Eye Distance:   Left Eye Distance:   Bilateral Distance:    Right Eye Near:   Left Eye Near:    Bilateral Near:     Physical Exam Vitals and nursing note reviewed.  Constitutional:      Appearance: She is well-developed and well-nourished.     Comments: No acute distress  HENT:     Head: Normocephalic and atraumatic.     Ears:     Comments:      Nose: Nose normal.  Eyes:     Conjunctiva/sclera: Conjunctivae normal.  Cardiovascular:     Rate and Rhythm: Normal rate.  Pulmonary:     Effort: Pulmonary effort is normal. No respiratory distress.  Abdominal:     General: There is no distension.  Musculoskeletal:        General: Normal range of motion.     Cervical back: Neck supple.     Comments: Right thumb mild swelling and ecchymosis noted to interphalangeal joint, nontender to palpation in this area, nontender to palpation of distal finger pad, sensation intact distally, mild tenderness to palpation at MCP joint, nontender throughout first through fifth metacarpal, radial pulse 2+, no snuffbox tenderness  Skin:    General: Skin is warm and dry.  Neurological:     Mental Status: She is alert and oriented to person, place, and time.  Psychiatric:        Mood and Affect: Mood and affect normal.      UC Treatments / Results  Labs (all labs ordered are listed, but only abnormal results are displayed) Labs Reviewed - No data to display  EKG   Radiology DG Finger Thumb Right  Result Date: 10/16/2020 CLINICAL DATA:  Fall 2 days  ago with persistent thumb pain, initial encounter EXAM: RIGHT THUMB 2+V COMPARISON:  None. FINDINGS: Fracture is noted through the base of the first distal phalanx extending into the articular surface. Mild  soft tissue swelling is noted. IMPRESSION: Fracture at the base of the first distal phalanx. Electronically Signed   By: Inez Catalina M.D.   On: 10/16/2020 11:36    Procedures Procedures (including critical care time)  Medications Ordered in UC Medications - No data to display  Initial Impression / Assessment and Plan / UC Course  I have reviewed the triage vital signs and the nursing notes.  Pertinent labs & imaging results that were available during my care of the patient were reviewed by me and considered in my medical decision making (see chart for details).     Fracture to proximal distal phalanx of right thumb.  Placing an static splint and will have follow-up with sports medicine for further monitoring of healing.  Tylenol for pain, tramadol for severe pain.  Ice.  Discussed strict return precautions. Patient verbalized understanding and is agreeable with plan.  Final Clinical Impressions(s) / UC Diagnoses   Final diagnoses:  Closed nondisplaced fracture of distal phalanx of right thumb, initial encounter     Discharge Instructions     There is a small fracture at the base of your thumb Wear splint to limit movement Tylenol for mild to moderate pain Tramadol for severe pain Ice Follow-up with sports medicine to monitor healing    ED Prescriptions    Medication Sig Dispense Auth. Provider   traMADol (ULTRAM) 50 MG tablet Take 1 tablet (50 mg total) by mouth every 6 (six) hours as needed. 10 tablet Iyesha Such, [place] C, PA-C     I have reviewed the PDMP during this encounter.   Janith Lima, PA-C 10/16/20 1145

## 2020-10-19 ENCOUNTER — Ambulatory Visit (INDEPENDENT_AMBULATORY_CARE_PROVIDER_SITE_OTHER): Payer: Medicare Other | Admitting: Family Medicine

## 2020-10-19 ENCOUNTER — Other Ambulatory Visit: Payer: Self-pay

## 2020-10-19 VITALS — BP 154/83 | Ht 61.0 in | Wt 223.0 lb

## 2020-10-19 DIAGNOSIS — S62524A Nondisplaced fracture of distal phalanx of right thumb, initial encounter for closed fracture: Secondary | ICD-10-CM

## 2020-10-19 NOTE — Patient Instructions (Signed)
Nice to meet you Please continue the splint   Please send me a message in MyChart with any questions or updates.  Please see me back in 7 days.   --Dr. Jordan Likes

## 2020-10-19 NOTE — Progress Notes (Signed)
Susan [name] - 64 y.o. female MRN [id number]  Date of birth: 1956-05-10  SUBJECTIVE:  Including CC & ROS.  No chief complaint on file.   Susan [name] is a 64 y.o. female that is presenting with right thumb pain.  She had a fall on 12/24 while working in the bathroom.  Unsure of the exact mechanism of the injury.  She has been using a splint since the day after Christmas.  Has some swelling and pain around the interphalangeal joint..  Independent review of the right thumb x-ray from 12/26 shows a fracture through the base of the first distal phalanx extending into the articular surface.   Review of Systems See HPI   HISTORY: Past Medical, Surgical, Social, and Family History Reviewed & Updated per EMR.   Pertinent Historical Findings include:  Past Medical History:  Diagnosis Date  . Allergy   . Anemia   . Anxiety state, unspecified   . Back injury   . HLD (hyperlipidemia)    intol of statins - follows with lipid clinic  . HTN (hypertension)   . Irritable bowel syndrome   . Lumbosacral spondylosis without myelopathy    improved with PT  . Morbid obesity ([place])     Past Surgical History:  Procedure Laterality Date  . ABSCESS DRAINAGE     abdominal  . CESAREAN SECTION  1977    Family History  Problem Relation Age of Onset  . Breast cancer Mother   . Diabetes Mother   . Hypertension Mother   . Multiple myeloma Mother   . Diabetes Father        on dialysis  . Kidney failure Father   . Colon cancer Maternal Grandmother     Social History   Socioeconomic History  . Marital status: Divorced    Spouse name: Not on file  . Number of children: 1  . Years of education: Not on file  . Highest education level: Not on file  Occupational History  . 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 Smoking status: Never Smoker  . Smokeless tobacco: Never Used  Vaping Use  . Vaping Use:  Never used  Substance and Sexual Activity  . Alcohol use: No  . Drug use: No  . Sexual activity: Not Currently  Other Topics Concern  . 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 Normal flexion extension of the joint. Tenderness palpation of the interphalangeal joint. Neurovascular intact   ASSESSMENT & PLAN:   Closed nondisplaced fracture of distal phalanx of right thumb Injury occurred on 12/24.  Does have extension into the joint but the joint stable on exam. -Counseled on splinting. -Counseled supportive care. -Follow-up in 1  week and reimage.

## 2020-10-19 NOTE — Assessment & Plan Note (Signed)
Injury occurred on 12/24.  Does have extension into the joint but the joint stable on exam. -Counseled on splinting. -Counseled supportive care. -Follow-up in 1 week and reimage.

## 2020-10-26 ENCOUNTER — Ambulatory Visit: Payer: Medicare Other | Admitting: Family Medicine

## 2020-10-26 ENCOUNTER — Ambulatory Visit (INDEPENDENT_AMBULATORY_CARE_PROVIDER_SITE_OTHER): Payer: Medicare Other | Admitting: Family Medicine

## 2020-10-26 ENCOUNTER — Ambulatory Visit
Admission: RE | Admit: 2020-10-26 | Discharge: 2020-10-26 | Disposition: A | Discharge status: Home / Self Care | Payer: Medicare Other | Source: Ambulatory Visit | Attending: Family Medicine | Admitting: Family Medicine

## 2020-10-26 ENCOUNTER — Encounter: Payer: Self-pay | Admitting: Family Medicine

## 2020-10-26 ENCOUNTER — Other Ambulatory Visit: Payer: Self-pay

## 2020-10-26 VITALS — BP 147/83 | Ht 60.0 in | Wt 222.0 lb

## 2020-10-26 DIAGNOSIS — S62524D Nondisplaced fracture of distal phalanx of right thumb, subsequent encounter for fracture with routine healing: Secondary | ICD-10-CM

## 2020-10-26 NOTE — Progress Notes (Addendum)
   PCP: Ardith Dark, MD  Subjective:   HPI: Patient is a 65 y.o. female here for follow-up on right thumb distal phalanx fracture.  She had a fall on 12/24 while working the bathroom, unclear mechanism of injury.  She initially presented to urgent care where x-rays showed a nondisplaced distal phalanx fracture with intra-articular involvement.  She was then placed in a splint and followed up with Dr. Jordan Likes on 12/29.  She was continued in a U-shaped protective aluminum splint with the thumb held in extension with plan to follow-up today for repeat x-rays.  Today, patient states that she has been using the splint, feels that her swelling and pain is significantly improved.  No new trauma or injury, no new concerns.  No numbness or tingling, no weakness.   Review of Systems:  Per HPI.   PMFSH, medications and smoking status reviewed.      Objective:  Physical Exam:  No flowsheet data found.   Gen: awake, alert, NAD, comfortable in exam room Pulm: breathing unlabored  Right thumb: Inspection: Mild edema around the IP joint with some ecchymoses, no erythema or warmth. Palpation: Mildly tender to palpation especially at the dorsum of the distal phalanx. ROM: Intact flexion and extension at the IP joint Strength: 5 out of 5 strength with flexion and extension of the IP joint with isolation of the IP joint Special tests: Normal varus and valgus stress testing Assessment & Plan:  1.  Closed nondisplaced fracture of the distal phalanx of the right thumb  Patient with improvement in symptoms today.  Given intra-articular involvement, will plan to obtain an x-ray to ensure no displacement.  Assuming this is the case, will plan to continue in the U-shaped splint for the next 2 to 3 weeks, follow-up at that time to reevaluate.  If pain has resolved, would transition out of the splint.   Guy Sandifer, MD [facility] Fellow 10/26/2020 3:15 PM  Addendum:  I was the preceptor for  this visit and available for immediate consultation.  Norton Blizzard MD Marrianne Mood

## 2020-10-27 ENCOUNTER — Ambulatory Visit: Payer: Federal, State, Local not specified - PPO

## 2020-11-03 ENCOUNTER — Encounter: Payer: Self-pay | Admitting: Family Medicine

## 2020-11-03 ENCOUNTER — Other Ambulatory Visit: Payer: Self-pay

## 2020-11-03 ENCOUNTER — Ambulatory Visit (INDEPENDENT_AMBULATORY_CARE_PROVIDER_SITE_OTHER): Payer: Medicare Other | Admitting: Family Medicine

## 2020-11-03 VITALS — BP 137/84 | HR 71 | Temp 98.1°F | Ht 60.0 in | Wt 226.8 lb

## 2020-11-03 DIAGNOSIS — M810 Age-related osteoporosis without current pathological fracture: Secondary | ICD-10-CM | POA: Diagnosis not present

## 2020-11-03 DIAGNOSIS — E785 Hyperlipidemia, unspecified: Secondary | ICD-10-CM | POA: Diagnosis not present

## 2020-11-03 DIAGNOSIS — R739 Hyperglycemia, unspecified: Secondary | ICD-10-CM | POA: Diagnosis not present

## 2020-11-03 DIAGNOSIS — I10 Essential (primary) hypertension: Secondary | ICD-10-CM | POA: Diagnosis not present

## 2020-11-03 DIAGNOSIS — Z0001 Encounter for general adult medical examination with abnormal findings: Secondary | ICD-10-CM | POA: Diagnosis not present

## 2020-11-03 DIAGNOSIS — W19XXXA Unspecified fall, initial encounter: Secondary | ICD-10-CM

## 2020-11-03 LAB — COMPREHENSIVE METABOLIC PANEL
ALT: 11 U/L (ref 0–35)
AST: 16 U/L (ref 0–37)
Albumin: 4.4 g/dL (ref 3.5–5.2)
Alkaline Phosphatase: 78 U/L (ref 39–117)
BUN: 14 mg/dL (ref 6–23)
CO2: 31 mEq/L (ref 19–32)
Calcium: 10.2 mg/dL (ref 8.4–10.5)
Chloride: 101 mEq/L (ref 96–112)
Creatinine, Ser: 0.71 mg/dL (ref 0.40–1.20)
GFR: 88.9 mL/min (ref >60.00)
Glucose, Bld: 91 mg/dL (ref 70–99)
Potassium: 3.7 mEq/L (ref 3.5–5.1)
Sodium: 137 mEq/L (ref 135–145)
Total Bilirubin: 0.3 mg/dL (ref 0.2–1.2)
Total Protein: 7.8 g/dL (ref 6.0–8.3)

## 2020-11-03 LAB — LIPID PANEL
Cholesterol: 214 mg/dL — ABNORMAL HIGH (ref 0–200)
HDL: 79.8 mg/dL (ref >39.00)
LDL Cholesterol: 110 mg/dL — ABNORMAL HIGH (ref 0–99)
NonHDL: 134.15
Total CHOL/HDL Ratio: 3
Triglycerides: 119 mg/dL (ref 0.0–149.0)
VLDL: 23.8 mg/dL (ref 0.0–40.0)

## 2020-11-03 LAB — CBC
HCT: 39.7 % (ref 36.0–46.0)
Hemoglobin: 13.2 g/dL (ref 12.0–15.0)
MCHC: 33.3 g/dL (ref 30.0–36.0)
MCV: 90.1 fl (ref 78.0–100.0)
Platelets: 437 10*3/uL — ABNORMAL HIGH (ref 150.0–400.0)
RBC: 4.41 Mil/uL (ref 3.87–5.11)
RDW: 14.1 % (ref 11.5–15.5)
WBC: 8.2 10*3/uL (ref 4.0–10.5)

## 2020-11-03 LAB — HEMOGLOBIN A1C: Hgb A1c MFr Bld: 6 % (ref 4.6–6.5)

## 2020-11-03 LAB — TSH: TSH: 0.47 u[IU]/mL (ref 0.35–4.50)

## 2020-11-03 LAB — VITAMIN D 25 HYDROXY (VIT D DEFICIENCY, FRACTURES): VITD: 28.6 ng/mL — ABNORMAL LOW (ref 30.00–100.00)

## 2020-11-03 NOTE — Patient Instructions (Signed)
It was very nice to see you today!  We will check blood work today.  Also place referral for you to see the physical therapist.  I will see back in year for your next physical.  Please convert to see me sooner if needed.  Take care, Dr Jerline Pain  Please try these tips to maintain a healthy lifestyle:   Eat at least 3 REAL meals and 1-2 snacks per day.  Aim for no more than 5 hours between eating.  If you eat breakfast, please do so within one hour of getting up.    Each meal should contain half fruits/vegetables, one quarter protein, and one quarter carbs (no bigger than a computer mouse)   Cut down on sweet beverages. This includes juice, soda, and sweet tea.     Drink at least 1 glass of water with each meal and aim for at least 8 glasses per day   Exercise at least 150 minutes every week.    Preventive Care 6 Years and Older, Female Preventive care refers to lifestyle choices and visits with your health care provider that can promote health and wellness. This includes:  A yearly physical exam. This is also called an annual wellness visit.  Regular dental and eye exams.  Immunizations.  Screening for certain conditions.  Healthy lifestyle choices, such as: ? Eating a healthy diet. ? Getting regular exercise. ? Not using drugs or products that contain nicotine and tobacco. ? Limiting alcohol use. What can I expect for my preventive care visit? Physical exam Your health care provider will check your:  Height and weight. These may be used to calculate your BMI (body mass index). BMI is a measurement that tells if you are at a healthy weight.  Heart rate and blood pressure.  Body temperature.  Skin for abnormal spots. Counseling Your health care provider may ask you questions about your:  Past medical problems.  Family's medical history.  Alcohol, tobacco, and drug use.  Emotional well-being.  Home life and relationship well-being.  Sexual  activity.  Diet, exercise, and sleep habits.  History of falls.  Memory and ability to understand (cognition).  Work and work Statistician.  Pregnancy and menstrual history.  Access to firearms. What immunizations do I need? Vaccines are usually given at various ages, according to a schedule. Your health care provider will recommend vaccines for you based on your age, medical history, and lifestyle or other factors, such as travel or where you work.   What tests do I need? Blood tests  Lipid and cholesterol levels. These may be checked every 5 years, or more often depending on your overall health.  Hepatitis C test.  Hepatitis B test. Screening  Lung cancer screening. You may have this screening every year starting at age 30 if you have a 30-pack-year history of smoking and currently smoke or have quit within the past 15 years.  Colorectal cancer screening. ? All adults should have this screening starting at age 31 and continuing until age 33. ? Your health care provider may recommend screening at age 12 if you are at increased risk. ? You will have tests every 1-10 years, depending on your results and the type of screening test.  Diabetes screening. ? This is done by checking your blood sugar (glucose) after you have not eaten for a while (fasting). ? You may have this done every 1-3 years.  Mammogram. ? This may be done every 1-2 years. ? Talk with your health care provider  about how often you should have regular mammograms.  Abdominal aortic aneurysm (AAA) screening. You may need this if you are a current or former smoker.  BRCA-related cancer screening. This may be done if you have a family history of breast, ovarian, tubal, or peritoneal cancers. Other tests  STD (sexually transmitted disease) testing, if you are at risk.  Bone density scan. This is done to screen for osteoporosis. You may have this done starting at age 20. Talk with your health care provider about  your test results, treatment options, and if necessary, the need for more tests. Follow these instructions at home: Eating and drinking  Eat a diet that includes fresh fruits and vegetables, whole grains, lean protein, and low-fat dairy products. Limit your intake of foods with high amounts of sugar, saturated fats, and salt.  Take vitamin and mineral supplements as recommended by your health care provider.  Do not drink alcohol if your health care provider tells you not to drink.  If you drink alcohol: ? Limit how much you have to 0-1 drink a day. ? Be aware of how much alcohol is in your drink. In the [place] one drink equals one 12 oz bottle of beer (355 mL), one 5 oz glass of wine (148 mL), or one 1 oz glass of hard liquor (44 mL).   Lifestyle  Take daily care of your teeth and gums. Brush your teeth every morning and night with fluoride toothpaste. Floss one time each day.  Stay active. Exercise for at least 30 minutes 5 or more days each week.  Do not use any products that contain nicotine or tobacco, such as cigarettes, e-cigarettes, and chewing tobacco. If you need help quitting, ask your health care provider.  Do not use drugs.  If you are sexually active, practice safe sex. Use a condom or other form of protection in order to prevent STIs (sexually transmitted infections).  Talk with your health care provider about taking a low-dose aspirin or statin.  Find healthy ways to cope with stress, such as: ? Meditation, yoga, or listening to music. ? Journaling. ? Talking to a trusted person. ? Spending time with friends and family. Safety  Always wear your seat belt while driving or riding in a vehicle.  Do not drive: ? If you have been drinking alcohol. Do not ride with someone who has been drinking. ? When you are tired or distracted. ? While texting.  Wear a helmet and other protective equipment during sports activities.  If you have firearms in your house, make sure  you follow all gun safety procedures. What's next?  Visit your health care provider once a year for an annual wellness visit.  Ask your health care provider how often you should have your eyes and teeth checked.  Stay up to date on all vaccines. This information is not intended to replace advice given to you by your health care provider. Make sure you discuss any questions you have with your health care provider. Document Revised: 09/28/2020 Document Reviewed: 10/02/2018 Elsevier Patient Education  2021 [employer].

## 2020-11-03 NOTE — Assessment & Plan Note (Signed)
At goal.  Continue amlodipine 2.5 mg daily and HCTZ 25 mg daily. 

## 2020-11-03 NOTE — Assessment & Plan Note (Signed)
Check lipids.  Continue Zetia 10 mg daily.

## 2020-11-03 NOTE — Assessment & Plan Note (Signed)
Check vitamin D level.  Continue calcium and vitamin D supplementation.  Repeat DEXA 2023.

## 2020-11-03 NOTE — Progress Notes (Signed)
Chief Complaint:  Susan [name] is a 65 y.o. female who presents today for her annual comprehensive physical exam.    Assessment/Plan:  New/Acute Problems: Fall Patient recently fell and broke her thumb.  This is being managed by sports medicine.  She has noticed a little bit unsteady on her feet lately.  Will place referral to physical therapy for further evaluation and management.  Chronic Problems Addressed Today: Essential hypertension At goal.  Continue amlodipine 2.5 mg daily and HCTZ 25 mg daily.  Dyslipidemia Check lipids.  Continue Zetia 10 mg daily.  Osteoporosis with last DEXA 02/2020 Check vitamin D level.  Continue calcium and vitamin D supplementation.  Repeat DEXA 2023.  Preventative Healthcare: Up-to-date on vaccines and screenings.  Patient Counseling(The following topics were reviewed and/or handout was given):  -Nutrition: Stressed importance of moderation in sodium/caffeine intake, saturated fat and cholesterol, caloric balance, sufficient intake of fresh fruits, vegetables, and fiber.  -Stressed the importance of regular exercise.   -Substance Abuse: Discussed cessation/primary prevention of tobacco, alcohol, or other drug use; driving or other dangerous activities under the influence; availability of treatment for abuse.   -Injury prevention: Discussed safety belts, safety helmets, smoke detector, smoking near bedding or upholstery.   -Sexuality: Discussed sexually transmitted diseases, partner selection, use of condoms, avoidance of unintended pregnancy and contraceptive alternatives.   -Dental health: Discussed importance of regular tooth brushing, flossing, and dental visits.  -Health maintenance and immunizations reviewed. Please refer to Health maintenance section.  Return to care in 1 year for next preventative visit.     Subjective:  HPI:  She has no acute complaints today. See A/p for status of chronic conditions.   Lifestyle Diet:  Cutting down on sugars.  Exercise: Limited.   Depression screen PHQ 2/9 11/03/2020  Decreased Interest 0  Down, Depressed, Hopeless 0  PHQ - 2 Score 0    Health Maintenance Due  Topic Date Due  . HIV Screening  Never done     ROS: Per HPI, otherwise a complete review of systems was negative.   PMH:  The following were reviewed and entered/updated in epic: Past Medical History:  Diagnosis Date  . Allergy   . Anemia   . Anxiety state, unspecified   . Back injury   . HLD (hyperlipidemia)    intol of statins - follows with lipid clinic  . HTN (hypertension)   . Irritable bowel syndrome   . Lumbosacral spondylosis without myelopathy    improved with PT  . Morbid obesity [facility])    Patient Active Problem List   Diagnosis Date Noted  . Closed nondisplaced fracture of distal phalanx of right thumb 10/19/2020  . Osteoporosis with last DEXA 02/2020 03/15/2020  . Alopecia 08/21/2019  . Hemorrhoid 08/21/2019  . VENOUS INSUFFICIENCY 08/18/2010  . SPONDYLOSIS, LUMBAR 01/17/2009  . Dyslipidemia 11/26/2007  . Essential hypertension 11/25/2007   Past Surgical History:  Procedure Laterality Date  . ABSCESS DRAINAGE     abdominal  . CESAREAN SECTION  1977    Family History  Problem Relation Age of Onset  . Breast cancer Mother   . Diabetes Mother   . Hypertension Mother   . Multiple myeloma Mother   . Diabetes Father        on dialysis  . Kidney failure Father   . Colon cancer Maternal Grandmother     Medications- reviewed and updated Current Outpatient Medications  Medication Sig Dispense Refill  . albuterol (PROAIR HFA) 108 (90 Base) MCG/ACT  inhaler Inhale 2 puffs into the lungs every 6 (six) hours as needed for wheezing or shortness of breath. 1 each 0  . amLODipine (NORVASC) 2.5 MG tablet Take 1 tablet (2.5 mg total) by mouth daily. 90 tablet 3  . calcium citrate (CALCITRATE - DOSED IN MG ELEMENTAL CALCIUM) 950 (200 Ca) MG tablet Take 200 mg of elemental calcium by  mouth daily.    . diclofenac Sodium (VOLTAREN) 1 % GEL Apply 4 g to affected area up to 4 times daily.    Marland Kitchen docusate sodium (COLACE) 100 MG capsule Take 100 mg by mouth 2 (two) times daily.    Marland Kitchen ezetimibe (ZETIA) 10 MG tablet Take 1 tablet (10 mg total) by mouth daily. 90 tablet 3  . finasteride (PROSCAR) 5 MG tablet Take by mouth.    . Fluocinolone Acetonide Body 0.01 % OIL Apply to scalp 3 times per week 120 mL 1  . hydrochlorothiazide (HYDRODIURIL) 25 MG tablet Take 1 tablet (25 mg total) by mouth daily. 90 tablet 3  . orphenadrine (NORFLEX) 100 MG tablet Take 100 mg by mouth as needed for muscle spasms.     . traMADol (ULTRAM) 50 MG tablet Take 1 tablet (50 mg total) by mouth every 6 (six) hours as needed. 10 tablet 0   No current facility-administered medications for this visit.    Allergies-reviewed and updated Allergies  Allergen Reactions  . Aspirin     Other reaction(s): Bleeding  . Avelox [Moxifloxacin Hcl In Nacl] Anaphylaxis    Pt c/o swelling of her throat and tongue  . Shellfish Allergy Anaphylaxis and Hives    Rash and itching Rash and itching  . Ibuprofen Other (See Comments)    Gastric bleeding Gastric bleeding  . Codeine Other (See Comments)    REACTION: causes her heart to race  . Crestor [Rosuvastatin Calcium]     Pt states INTOLERANT w/ muscle cramps  . Penicillins Hives    REACTION: rash  . Pravastatin Sodium [Pravachol]     REACTION: intolerant= headache & insomnia  . Simvastatin     REACTION: intolerant= headache & insomnia    Social History   Socioeconomic History  . Marital status: Divorced    Spouse name: Not on file  . Number of children: 1  . Years of education: Not on file  . Highest education level: Not on file  Occupational History  . 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 Smoking status: Never Smoker  . Smokeless tobacco: Never Used  Vaping Use  . Vaping Use:  Never used  Substance and Sexual Activity  . Alcohol use: No  . Drug use: No  . Sexual activity: Not Currently  Other Topics Concern  . 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 Wt Readings from Last 3 Encounters:  11/03/20 226 lb 12.8 oz (102.9 kg)  10/26/20 222 lb (100.7 kg)  10/19/20 223 lb (101.2 kg)   Gen: NAD, resting comfortably HEENT: TMs normal bilaterally. OP clear. No thyromegaly noted.  CV: RRR with no murmurs appreciated Pulm: NWOB, CTAB with no crackles, wheezes, or rhonchi GI: Normal bowel sounds present. Soft, Nontender, Nondistended. MSK: no edema, cyanosis, or clubbing noted Skin: warm, dry Neuro: CN2-12 grossly intact. Strength 5/5 in upper and lower extremities. Reflexes symmetric and intact bilaterally.  Psych: Normal affect and thought content     Rmani Kapusta M. Jerline Pain, MD 11/03/2020 11:09 AM

## 2020-11-04 NOTE — Progress Notes (Signed)
Please inform patient of the following:  Vitamin D is a bit low. Recommend she take 2000IU daily if she is not doing so already.  Cholesterol numbers look much better. Blood sugar borderline but stable. Everything else is NORMAL.  Do not need to make any other changes to her treatment plan at this time. Would like for her to keep working on diet and exercise and we can recheck in a year.  Algis Greenhouse. Jerline Pain, MD 11/04/2020 1:02 PM

## 2020-11-14 ENCOUNTER — Other Ambulatory Visit: Payer: Self-pay | Admitting: *Deleted

## 2020-11-14 ENCOUNTER — Telehealth: Payer: Self-pay

## 2020-11-14 MED ORDER — AMLODIPINE BESYLATE 2.5 MG PO TABS: 2.5000 mg | ORAL_TABLET | Freq: Every day | ORAL | 3 refills | Status: DC

## 2020-11-14 MED ORDER — EZETIMIBE 10 MG PO TABS: 10.0000 mg | ORAL_TABLET | Freq: Every day | ORAL | 3 refills | Status: DC

## 2020-11-14 MED ORDER — HYDROCHLOROTHIAZIDE 25 MG PO TABS: 25.0000 mg | ORAL_TABLET | Freq: Every day | ORAL | 3 refills | Status: DC

## 2020-11-14 NOTE — Telephone Encounter (Signed)
Refill sent to pharmacy.   

## 2020-11-14 NOTE — Telephone Encounter (Signed)
MEDICATION: hydrochlorothiazide (HYDRODIURIL) 25 MG tablet  ezetimibe (ZETIA) 10 MG tablet  amLODipine (NORVASC) 2.5 MG tablet  PHARMACY: [facility]  Comments:   **Let patient know to contact pharmacy at the end of the day to make sure medication is ready. **  ** Please notify patient to allow 48-72 hours to process**  **Encourage patient to contact the pharmacy for refills or they can request refills through [facility]**

## 2020-11-15 MED ORDER — EZETIMIBE 10 MG PO TABS: 10.0000 mg | ORAL_TABLET | Freq: Every day | ORAL | 3 refills | Status: DC

## 2020-11-15 NOTE — Addendum Note (Signed)
Addended by: Marian Sorrow on: 11/15/2020 03:17 PM   Modules accepted: Orders

## 2020-11-16 ENCOUNTER — Encounter: Payer: Self-pay | Admitting: Family Medicine

## 2020-11-16 ENCOUNTER — Other Ambulatory Visit: Payer: Self-pay

## 2020-11-16 ENCOUNTER — Ambulatory Visit (INDEPENDENT_AMBULATORY_CARE_PROVIDER_SITE_OTHER): Payer: Medicare Other | Admitting: Family Medicine

## 2020-11-16 VITALS — BP 140/78 | Ht 60.0 in | Wt 226.0 lb

## 2020-11-16 DIAGNOSIS — S62524D Nondisplaced fracture of distal phalanx of right thumb, subsequent encounter for fracture with routine healing: Secondary | ICD-10-CM | POA: Diagnosis present

## 2020-11-16 NOTE — Progress Notes (Addendum)
   PCP: Vivi Barrack, MD  Subjective:   HPI: Patient is a 65 y.o. 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  X-rays initially showed nondisplaced distal phalanx fracture with intra-articular involvement.  She has been in a U-shaped protective aluminum splint with the thumb held in extension.  Today, patient reports that she has had continued improvement in her symptoms.  She does continue to have some pain on the dorsal aspect of the thumb just distal to the IP joint.  No swelling.  No new injury or trauma.  She is mostly using the aluminum splint when she is out and about, but is taking it off at home sometimes.  She has not noticed any weakness, numbness or tingling.  Review of Systems:  Per HPI.   [place], medications and smoking status reviewed.      Objective:  Physical Exam:  No flowsheet data found.   Gen: awake, alert, NAD, comfortable in exam room Pulm: breathing unlabored  Right thumb: Inspection: No edema, ecchymoses, erythema or warmth. Palpation: Mildly tender to palpation over the dorsum of the distal phalanx. ROM: Intact flexion and extension at the IP joint Strength: 5 out of 5 strength with flexion and extension of the IP joint with isolation of the IP joint Special tests: Normal varus and valgus stress testing   Assessment & Plan:  1.  Closed nondisplaced fracture of the distal phalanx of the right thumb  Patient now 4 weeks out from initial injury.  Symptoms continue to improve and she has normal strength with flexion and extension of the IP joint.  Normal range of motion.  Given that she continues to have some pain over the area of the fracture, recommend continuing with splint for 2 more weeks, for total of 6 weeks, and then she can transition out.  Follow-up as needed if she does not continue to improve or any new concerns.  Dagoberto Ligas, MD [facility] 11/16/2020 1:10 PM  Addendum:  I was the preceptor for this visit and available for immediate consultation.  Karlton Lemon MD Kirt Boys

## 2021-01-03 ENCOUNTER — Ambulatory Visit: Payer: Federal, State, Local not specified - PPO

## 2021-01-09 ENCOUNTER — Other Ambulatory Visit: Payer: Self-pay | Admitting: Physician Assistant

## 2021-01-09 DIAGNOSIS — R1084 Generalized abdominal pain: Secondary | ICD-10-CM

## 2021-01-09 LAB — IRON,TIBC AND FERRITIN PANEL
%SAT: 10
Ferritin: 100.3
Iron: 36
TIBC: 359

## 2021-01-09 LAB — BASIC METABOLIC PANEL
BUN: 12 (ref 4–21)
CO2: 33 — AB (ref 13–22)
Chloride: 99 (ref 99–108)
Creatinine: 0.7 (ref 0.5–1.1)
Glucose: 89
Potassium: 3.7 (ref 3.4–5.3)
Sodium: 139 (ref 137–147)

## 2021-01-09 LAB — HEPATIC FUNCTION PANEL
ALT: 11 (ref 7–35)
AST: 17 (ref 13–35)
Alkaline Phosphatase: 70 (ref 25–125)
Bilirubin, Total: 0.3

## 2021-01-09 LAB — COMPREHENSIVE METABOLIC PANEL
Albumin: 4.2 (ref 3.5–5.0)
Calcium: 10 (ref 8.7–10.7)
GFR calc Af Amer: 96

## 2021-01-09 LAB — CBC AND DIFFERENTIAL
HCT: 38 (ref 36–46)
Hemoglobin: 12.5 (ref 12.0–16.0)
Platelets: 398 (ref 150–399)
WBC: 8.2

## 2021-01-09 LAB — CBC: RBC: 4.18 (ref 3.87–5.11)

## 2021-01-26 ENCOUNTER — Ambulatory Visit
Admission: RE | Admit: 2021-01-26 | Discharge: 2021-01-26 | Disposition: A | Discharge status: Home / Self Care | Payer: Medicare Other | Source: Ambulatory Visit | Attending: Physician Assistant | Admitting: Physician Assistant

## 2021-01-26 DIAGNOSIS — R1084 Generalized abdominal pain: Secondary | ICD-10-CM

## 2021-01-26 MED ORDER — IOPAMIDOL (ISOVUE-300) INJECTION 61%
100.0000 mL | Freq: Once | INTRAVENOUS | Status: AC | PRN
  Administered 2021-01-26: 100 mL via INTRAVENOUS

## 2021-01-30 ENCOUNTER — Telehealth: Payer: Self-pay

## 2021-01-30 ENCOUNTER — Ambulatory Visit: Payer: Medicare Other | Admitting: Family Medicine

## 2021-01-30 ENCOUNTER — Other Ambulatory Visit: Payer: Self-pay

## 2021-01-30 ENCOUNTER — Encounter: Payer: Self-pay | Admitting: Family Medicine

## 2021-01-30 ENCOUNTER — Ambulatory Visit (INDEPENDENT_AMBULATORY_CARE_PROVIDER_SITE_OTHER): Payer: Medicare Other | Admitting: Family Medicine

## 2021-01-30 VITALS — BP 129/75 | HR 62 | Temp 98.1°F | Ht 60.0 in | Wt 228.8 lb

## 2021-01-30 DIAGNOSIS — I10 Essential (primary) hypertension: Secondary | ICD-10-CM

## 2021-01-30 DIAGNOSIS — D219 Benign neoplasm of connective and other soft tissue, unspecified: Secondary | ICD-10-CM | POA: Diagnosis not present

## 2021-01-30 MED ORDER — FLUOCINOLONE ACETONIDE BODY 0.01 % EX OIL
TOPICAL_OIL | CUTANEOUS | 1 refills | Status: DC
Start: 2021-01-30 — End: 2022-02-23

## 2021-01-30 NOTE — Assessment & Plan Note (Signed)
Will place referral to OB/GYN for further management. Found on CT scan last week. Symptomatic.

## 2021-01-30 NOTE — Assessment & Plan Note (Signed)
At goal.  Continue amlodipine 2.5 mg daily and HCTZ 25 mg daily.

## 2021-01-30 NOTE — Progress Notes (Signed)
   Susan [name] is a 65 y.o. female who presents today for an office visit.  Assessment/Plan:  Chronic Problems Addressed Today: Essential hypertension At goal.  Continue amlodipine 2.5 mg daily and HCTZ 25 mg daily.  Fibroids Will place referral to OB/GYN for further management. Found on CT scan last week. Symptomatic.      Subjective:  HPI:  See A/P.  Patient here for a fibroid follow-up.  She has been following with GI with concerns for low abdominal cramping and pain.  Had a CT scan done last week which showed probable 2.3 cm exophytic fibroid.  She was advised to follow-up with her PCP discuss management.       Objective:  Physical Exam: BP 129/75   Pulse 62   Temp 98.1 F (36.7 C) (Temporal)   Ht 5' (1.524 m)   Wt 228 lb 12.8 oz (103.8 kg)   SpO2 98%   BMI 44.68 kg/m   Gen: No acute distress, resting comfortably Neuro: Grossly normal, moves all extremities Psych: Normal affect and thought content      Charlyne Robertshaw M. Jerline Pain, MD 01/30/2021 10:09 AM

## 2021-01-30 NOTE — Patient Instructions (Signed)
It was very nice to see you today!  I will refer you to gynecology.  I will refill your medications.  Take care, Dr Jerline Pain  PLEASE NOTE:  If you had any lab tests please let us know if you have not heard back within a few days. You may see your results on mychart before we have a chance to review them but we will give you a call once they are reviewed by [place]. If we ordered any referrals today, please let us know if you have not heard from their office within the next week.   Please try these tips to maintain a healthy lifestyle:   Eat at least 3 REAL meals and 1-2 snacks per day.  Aim for no more than 5 hours between eating.  If you eat breakfast, please do so within one hour of getting up.    Each meal should contain half fruits/vegetables, one quarter protein, and one quarter carbs (no bigger than a computer mouse)   Cut down on sweet beverages. This includes juice, soda, and sweet tea.     Drink at least 1 glass of water with each meal and aim for at least 8 glasses per day   Exercise at least 150 minutes every week.

## 2021-01-30 NOTE — Telephone Encounter (Signed)
Immunization record updated.

## 2021-01-30 NOTE — Telephone Encounter (Signed)
Pt received second dose of Shringrix on March 18

## 2021-02-01 ENCOUNTER — Encounter: Payer: Self-pay | Admitting: Family Medicine

## 2021-02-08 ENCOUNTER — Ambulatory Visit: Payer: Medicare Other | Admitting: Family Medicine

## 2021-02-14 ENCOUNTER — Encounter: Payer: Self-pay | Admitting: Obstetrics and Gynecology

## 2021-02-14 ENCOUNTER — Other Ambulatory Visit: Payer: Self-pay

## 2021-02-14 ENCOUNTER — Ambulatory Visit (INDEPENDENT_AMBULATORY_CARE_PROVIDER_SITE_OTHER): Payer: Medicare Other | Admitting: Obstetrics and Gynecology

## 2021-02-14 VITALS — BP 130/78 | HR 58 | Ht 60.0 in | Wt 232.0 lb

## 2021-02-14 DIAGNOSIS — R1032 Left lower quadrant pain: Secondary | ICD-10-CM

## 2021-02-14 DIAGNOSIS — R1900 Intra-abdominal and pelvic swelling, mass and lump, unspecified site: Secondary | ICD-10-CM | POA: Diagnosis not present

## 2021-02-14 DIAGNOSIS — K5900 Constipation, unspecified: Secondary | ICD-10-CM

## 2021-02-14 NOTE — Progress Notes (Signed)
65 y.o. No obstetric history on file. [id number] Divorced Black or [place] American Not Hispanic or Latino female here for fibroids. She had a CT and that showed a 2cm fibroid.    The patient is sent by Dr Jerline Pain for evaluation of pelvic lesion noted on CT which was done for abdominal pain.   01/26/21 CT: Reproductive: The uterus is anteverted and grossly unremarkable. A 2.3 cm rounded hypodense lesion in the posterior pelvis to the right of the midline is not characterized but may represent an exophytic fibroid or an adnexal lesion. Further evaluation with ultrasound is Recommended.  IMPRESSION: 1. No acute intra-abdominal or pelvic pathology. No bowel obstruction. Normal appendix. 2. A 2.3 cm rounded hypodense lesion in the right posterior pelvis may represent an exophytic fibroid. Further evaluation with ultrasound recommended. 3. Aortic Atherosclerosis (ICD10-I70.0).  Labs 01/09/21: Normal liver panel Normal creatinine Normal CBC  Labs 11/03/20 HgbA1C 6% Normal TSH of 0.47  She c/o a 3 year h/o intermittent LLQ pain. She notices the pain 3-4 x a week, lasts for 2-3 hours. The pain is a throbbing ache, up to an 8/10 in severity. She has long term constipation and feels the pain improves with a good BM. She has a small, firm BM 1-2 x a day. Sometimes needs to strain. She had a normal colonoscopy in 2019.  She is taking colace for her constipation.   No vaginal bleeding. Not sexually active. No urinary c/o.   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     No LMP recorded. Patient is postmenopausal.          Sexually active: No.  The current method of family planning is post menopausal status.    Exercising: Yes.    walking and PT Smoker:  no  Health Maintenance: Pap:  01/2020  Normal  History of abnormal Pap:  no MMG:  09/20/20 density A Bi-rads 1 neg BMD:   03/04/20 Osteoporotic  Colonoscopy: 2019 f/u 10 TDaP:  2017  Gardasil: NA   reports that she  has never smoked. She has never used smokeless tobacco. She reports that she does not drink alcohol and does not use drugs. Daughter lives in [place], 2 grandchildren (20 and 20).   Past Medical History:  Diagnosis Date  . Allergy   . Anemia   . Anxiety state, unspecified   . Back injury   . HLD (hyperlipidemia)    intol of statins - follows with lipid clinic  . HTN (hypertension)   . Irritable bowel syndrome   . Lumbosacral spondylosis without myelopathy    improved with PT  . Morbid obesity ([place])     Past Surgical History:  Procedure Laterality Date  . ABSCESS DRAINAGE     abdominal  . CESAREAN SECTION  1977    Current Outpatient Medications  Medication Sig Dispense Refill  . amLODipine (NORVASC) 2.5 MG tablet Take 1 tablet (2.5 mg total) by mouth daily. 90 tablet 3  . calcium citrate (CALCITRATE - DOSED IN MG ELEMENTAL CALCIUM) 950 (200 Ca) MG tablet Take 200 mg of elemental calcium by mouth daily.    . diclofenac Sodium (VOLTAREN) 1 % GEL Apply 4 g to affected area up to 4 times daily.    Marland Kitchen docusate sodium (COLACE) 100 MG capsule Take 100 mg by mouth 2 (two) times daily.    Marland Kitchen ezetimibe (ZETIA) 10 MG tablet Take 1 tablet (10 mg total) by mouth daily. 90 tablet 3  . finasteride (  PROSCAR) 5 MG tablet Take by mouth.    . Fluocinolone Acetonide Body 0.01 % OIL Apply to scalp 3 times per week 120 mL 1  . hydrochlorothiazide (HYDRODIURIL) 25 MG tablet Take 1 tablet (25 mg total) by mouth daily. 90 tablet 3  . orphenadrine (NORFLEX) 100 MG tablet Take 100 mg by mouth as needed for muscle spasms.     Marland Kitchen albuterol (PROAIR HFA) 108 (90 Base) MCG/ACT inhaler Inhale 2 puffs into the lungs every 6 (six) hours as needed for wheezing or shortness of breath. 1 each 0   No current facility-administered medications for this visit.    Family History  Problem Relation Age of Onset  . Breast cancer Mother   . Diabetes Mother   . Hypertension Mother   . Multiple myeloma Mother   . Diabetes  Father        on dialysis  . Kidney failure Father   . Colon cancer Maternal Grandmother     Review of Systems  All other systems reviewed and are negative.   Exam:   BP 130/78   Pulse (!) 58   Ht 5' (1.524 m)   Wt 232 lb (105.2 kg)   SpO2 99%   BMI 45.31 kg/m   Weight change: _0 @ Height:   Height: 5' (152.4 cm)  Ht Readings from Last 3 Encounters:  02/14/21 5' (1.524 m)  01/30/21 5' (1.524 m)  11/16/20 5' (1.524 m)    General appearance: alert, cooperative and appears stated age Head: Normocephalic, without obvious abnormality, atraumatic Neck: no adenopathy, supple, symmetrical, trachea midline and thyroid normal to inspection and palpation Lungs: clear to auscultation bilaterally Cardiovascular: regular rate and rhythm Abdomen: soft, non-tender; non distended,  no masses,  no organomegaly Extremities: extremities normal, atraumatic, no cyanosis or edema Skin: Skin color, texture, turgor normal. No rashes or lesions Lymph nodes: Cervical, supraclavicular, and axillary nodes normal. No abnormal inguinal nodes palpated Neurologic: Grossly normal   Pelvic: External genitalia:  no lesions              Urethra:  normal appearing urethra with no masses, tenderness or lesions              Bartholins and Skenes: normal                 Vagina: normal appearing vagina with normal color and discharge, no lesions              Cervix: no lesions               Bimanual Exam:  Uterus:  uterus not appreciably enlarged, mobile, not tender.               Adnexa: no mass, fullness, tenderness               Rectovaginal: Confirms               Anus:  normal sphincter tone, no lesions  Gae Dry chaperoned for the exam.  1. Pelvic mass Noted on CT, suspected exophytic fibroid. Not felt on exam. CT report and images reviewed. Return for ultrasound - US PELVIS TRANSVAGINAL NON-OB (TV ONLY); Future  2. LLQ abdominal pain Labs and CT reviewed Suspect from  constipation  3. Constipation, unspecified constipation type Discussed fruit, fiber and fluid Suggested she try magnesium 500 mg a day. If that doesn't help, she can try miralax or metamucil.   CC: Dr Jerline Pain

## 2021-02-14 NOTE — Patient Instructions (Signed)
Try Magnesium 500 mg a day for constipation If that doesn't work you can take miralax or metamucil  About Constipation  Constipation Overview Constipation is the most common gastrointestinal complaint -- about 4 million Americans experience constipation and make 2.5 million physician visits a year to get help for the problem.  Constipation can occur when the colon absorbs too much water, the colon's muscle contraction is slow or sluggish, and/or there is delayed transit time through the colon.  The result is stool that is hard and dry.  Indicators of constipation include straining during bowel movements greater than 25% of the time, having fewer than three bowel movements per week, and/or the feeling of incomplete evacuation.  There are established guidelines ([place] II ) for defining constipation. A person needs to have two or more of the following symptoms for at least 12 weeks (not necessarily consecutive) in the preceding 12 months: . Straining in  greater than 25% of bowel movements . Lumpy or hard stools in greater than 25% of bowel movements . Sensation of incomplete emptying in greater than 25% of bowel movements . Sensation of anorectal obstruction/blockade in greater than 25% of bowel movements . Manual maneuvers to help empty greater than 25% of bowel movements (e.g., digital evacuation, support of the pelvic floor)  . Less than  3 bowel movements/week . Loose stools are not present, and criteria for irritable bowel syndrome are insufficient  Common Causes of Constipation . Lack of fiber in your diet . Lack of physical activity . Medications, including iron and calcium supplements  . Dairy intake . Dehydration . Abuse of laxatives  Travel  Irritable Bowel Syndrome  Pregnancy  Luteal phase of menstruation (after ovulation and before menses)  Colorectal problems  Intestinal Dysfunction  Treating Constipation  There are several ways of treating constipation, including  changes to diet and exercise, use of laxatives, adjustments to the pelvic floor, and scheduled toileting.  These treatments include: . increasing fiber and fluids in the diet  . increasing physical activity . learning muscle coordination   learning proper toileting techniques and toileting modifications   designing and sticking  to a toileting schedule     2007, [employer] Doc.22

## 2021-03-07 ENCOUNTER — Other Ambulatory Visit: Payer: Self-pay

## 2021-03-07 ENCOUNTER — Ambulatory Visit (INDEPENDENT_AMBULATORY_CARE_PROVIDER_SITE_OTHER): Payer: Medicare Other

## 2021-03-07 ENCOUNTER — Ambulatory Visit (INDEPENDENT_AMBULATORY_CARE_PROVIDER_SITE_OTHER): Payer: Medicare Other | Admitting: Obstetrics and Gynecology

## 2021-03-07 ENCOUNTER — Encounter: Payer: Self-pay | Admitting: Obstetrics and Gynecology

## 2021-03-07 ENCOUNTER — Other Ambulatory Visit: Payer: Self-pay | Admitting: Obstetrics and Gynecology

## 2021-03-07 ENCOUNTER — Encounter: Payer: Self-pay | Admitting: Family Medicine

## 2021-03-07 DIAGNOSIS — R1900 Intra-abdominal and pelvic swelling, mass and lump, unspecified site: Secondary | ICD-10-CM

## 2021-03-07 DIAGNOSIS — D251 Intramural leiomyoma of uterus: Secondary | ICD-10-CM

## 2021-03-07 NOTE — Progress Notes (Signed)
GYNECOLOGY  VISIT   HPI: 65 y.o.   Divorced Black or [place] American Not Hispanic or Latino  female   [phone] with No LMP recorded. Patient is postmenopausal.   here for an ultrasound.     01/26/21 CT: Reproductive: The uterus is anteverted and grossly unremarkable. A 2.3 cm rounded hypodense lesion in the posterior pelvis to the right of the midline is not characterized but may represent an exophytic fibroid or an adnexal lesion. Further evaluation with ultrasound is Recommended.  IMPRESSION: 1. No acute intra-abdominal or pelvic pathology. No bowel obstruction. Normal appendix. 2. A 2.3 cm rounded hypodense lesion in the right posterior pelvis may represent an exophytic fibroid. Further evaluation with ultrasound recommended. 3. Aortic Atherosclerosis (ICD10-I70.0).  GYNECOLOGIC HISTORY: No LMP recorded. Patient is postmenopausal. Contraception: PMP Menopausal hormone therapy: none        OB History    Gravida  2   Para  1   Term  1   Preterm      AB  1   Living  1     SAB  1   IAB      Ectopic      Multiple      Live Births  1              Patient Active Problem List   Diagnosis Date Noted  . Fibroids 01/30/2021  . Closed nondisplaced fracture of distal phalanx of right thumb 10/19/2020  . Osteoporosis with last DEXA 02/2020 03/15/2020  . Alopecia 08/21/2019  . Hemorrhoid 08/21/2019  . 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 Skin inflammation 04/21/2015  . VENOUS INSUFFICIENCY 08/18/2010  . SPONDYLOSIS, LUMBAR 01/17/2009  . Dyslipidemia 11/26/2007  . Essential hypertension 11/25/2007    Past Medical History:  Diagnosis Date  . Allergy   . Anemia   . Anxiety state, unspecified   . Back injury   . HLD (hyperlipidemia)    intol of statins - follows with lipid clinic  . HTN (hypertension)   . Irritable bowel syndrome   . Lumbosacral spondylosis without myelopathy    improved with PT  . Morbid obesity ([place])     Past Surgical History:  Procedure  Laterality Date  . ABSCESS DRAINAGE     abdominal  . CESAREAN SECTION  1977    Current Outpatient Medications  Medication Sig Dispense Refill  . albuterol (PROAIR HFA) 108 (90 Base) MCG/ACT inhaler Inhale 2 puffs into the lungs every 6 (six) hours as needed for wheezing or shortness of breath. 1 each 0  . amLODipine (NORVASC) 2.5 MG tablet Take 1 tablet (2.5 mg total) by mouth daily. 90 tablet 3  . calcium citrate (CALCITRATE - DOSED IN MG ELEMENTAL CALCIUM) 950 (200 Ca) MG tablet Take 200 mg of elemental calcium by mouth daily.    . diclofenac Sodium (VOLTAREN) 1 % GEL Apply 4 g to affected area up to 4 times daily.    Marland Kitchen docusate sodium (COLACE) 100 MG capsule Take 100 mg by mouth 2 (two) times daily.    Marland Kitchen ezetimibe (ZETIA) 10 MG tablet Take 1 tablet (10 mg total) by mouth daily. 90 tablet 3  . finasteride (PROSCAR) 5 MG tablet Take by mouth.    . Fluocinolone Acetonide Body 0.01 % OIL Apply to scalp 3 times per week 120 mL 1  . hydrochlorothiazide (HYDRODIURIL) 25 MG tablet Take 1 tablet (25 mg total) by mouth daily. 90 tablet 3  . orphenadrine (NORFLEX) 100 MG tablet Take 100 mg by  mouth as needed for muscle spasms.      No current facility-administered medications for this visit.     ALLERGIES: Aspirin, Avelox [moxifloxacin hcl in nacl], Shellfish allergy, Ibuprofen, Codeine, Crestor [rosuvastatin calcium], Penicillins, Pravastatin sodium [pravachol], and Simvastatin  Family History  Problem Relation Age of Onset  . Breast cancer Mother   . Diabetes Mother   . Hypertension Mother   . Multiple myeloma Mother   . Diabetes Father        on dialysis  . Kidney failure Father   . Colon cancer Maternal Grandmother     Social History   Socioeconomic History  . Marital status: Divorced    Spouse name: Not on file  . Number of children: 1  . Years of education: Not on file  . Highest education level: Not on file  Occupational History  . 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 Smoking status: Never Smoker  . Smokeless tobacco: Never Used  Vaping Use  . Vaping Use: Never used  Substance and Sexual Activity  . Alcohol use: No  . Drug use: No  . Sexual activity: Not Currently  Other Topics Concern  . 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    General appearance: alert, cooperative and appears stated age  Pelvic ultrasound  Indications: pelvic mass seen on CT  Findings:  Uterus 5.6 x 3.66 x 2.74 cm  Fibroid: 1.18 x 1.13 cm, intramural  Endometrium 2.06 mm, symmetric  Left ovary 1.78 x 1.17 x 0.88 cm  Right ovary 1.68 x 1.05 x 0.88 cm   No free fluid   Impression:  Normal sized anteverted uterus with one small intramural myoma Thin, symmetrical endometrium Normal ovaries bilaterally No pelvic mass seen No free fluid  Images from CT were reviewed.  I was present for her ultrasound  1. Pelvic mass Pelvic ultrasound with normal uterus with a small intramural fibroid. Normal ovaries bilaterally, no adnexal mass seen.  Will reach out to the Radiologist for recommendations (a staff message was sent)

## 2021-03-12 ENCOUNTER — Telehealth: Payer: Self-pay | Admitting: Obstetrics and Gynecology

## 2021-03-12 NOTE — Telephone Encounter (Signed)
Please let the patient know that I was reviewing her GYN records from [facility] and noticed that she was due for a colonoscopy in 11/18 secondary to precancerous lesion noted in 11/13. I don't see that she had that done in the epic system. Can you please check with her if she had the colonoscopy, if not we can place a referral for her.

## 2021-03-13 NOTE — Telephone Encounter (Addendum)
Patient informed with below note, patient said she had colonoscopy done in 2019 with Dr. Wilford Corner with [facility] in 2019 I asked her to have his office fax results to our office.

## 2021-04-30 ENCOUNTER — Telehealth: Payer: Self-pay | Admitting: Obstetrics and Gynecology

## 2021-04-30 DIAGNOSIS — R1900 Intra-abdominal and pelvic swelling, mass and lump, unspecified site: Secondary | ICD-10-CM

## 2021-04-30 NOTE — Telephone Encounter (Addendum)
The patient had a CT in 4/22 that showed a pelvic mass, u/s didn't reveal a mass. Staff message to Radiologist requesting recommendations was sent.

## 2021-05-13 NOTE — Telephone Encounter (Signed)
Can you please call the Radiologist Dr Cleophus Molt. I've sent a staff message with no response. A pelvic mass was noted on CT, not seen on U/S, I need her recommendations for f/u imaging.  Thanks

## 2021-05-15 NOTE — Telephone Encounter (Signed)
Call placed to [facility] Radiology line.   Dr. Kris Hartmann reviewed, recommended MRI pelvis with and without contrast.   Routing to Dr. Talbert Nan.

## 2021-05-16 ENCOUNTER — Telehealth: Payer: Self-pay

## 2021-05-16 MED ORDER — ALPRAZOLAM 0.5 MG PO TABS: 0.5000 mg | ORAL_TABLET | ORAL | 0 refills | Status: DC | PRN

## 2021-05-16 NOTE — Telephone Encounter (Signed)
Left message for patient to call.

## 2021-05-16 NOTE — Telephone Encounter (Signed)
Per Dr. Talbert Nan "Please let the patient know that the radiologist recommended that she have a pelvic MRI since her CT showed a pelvic mass and it wasn't seen on ultrasound.  Please schedule with and without contrast.  Thanks,  Sharee Pimple "   Patient aware, order placed at [facility] patient aware she can call to schedule.

## 2021-05-16 NOTE — Telephone Encounter (Signed)
Patient states she is scheduled for a MRI on 8/4.  Would like to know if Dr. Jerline Pain could send a script for xanax 0.'5mg'$  to [facility] on [address].

## 2021-05-16 NOTE — Telephone Encounter (Signed)
See note

## 2021-05-22 NOTE — Telephone Encounter (Signed)
Patient scheduled on 05/25/21 @ 4:10pm at [facility] imaging for MRI pelvis w/wo  contrast

## 2021-05-25 ENCOUNTER — Other Ambulatory Visit: Payer: Self-pay

## 2021-05-25 ENCOUNTER — Ambulatory Visit
Admission: RE | Admit: 2021-05-25 | Discharge: 2021-05-25 | Disposition: A | Discharge status: Home / Self Care | Payer: Medicare Other | Source: Ambulatory Visit | Attending: Obstetrics and Gynecology | Admitting: Obstetrics and Gynecology

## 2021-05-25 DIAGNOSIS — R1900 Intra-abdominal and pelvic swelling, mass and lump, unspecified site: Secondary | ICD-10-CM

## 2021-05-25 MED ORDER — GADOBUTROL 1 MMOL/ML IV SOLN
20.0000 mL | Freq: Once | INTRAVENOUS | Status: AC | PRN
  Administered 2021-05-25: 20 mL via INTRAVENOUS

## 2021-05-27 ENCOUNTER — Encounter: Payer: Self-pay | Admitting: Family Medicine

## 2021-05-30 ENCOUNTER — Other Ambulatory Visit: Payer: Self-pay

## 2021-05-30 ENCOUNTER — Other Ambulatory Visit: Payer: Medicare Other

## 2021-05-30 DIAGNOSIS — R1903 Right lower quadrant abdominal swelling, mass and lump: Secondary | ICD-10-CM

## 2021-05-30 DIAGNOSIS — N949 Unspecified condition associated with female genital organs and menstrual cycle: Secondary | ICD-10-CM

## 2021-05-31 LAB — CA 125: CA 125: 4 U/mL (ref <35)

## 2021-07-03 ENCOUNTER — Encounter: Payer: Self-pay | Admitting: Physician Assistant

## 2021-07-03 ENCOUNTER — Ambulatory Visit (INDEPENDENT_AMBULATORY_CARE_PROVIDER_SITE_OTHER): Payer: Medicare Other | Admitting: Physician Assistant

## 2021-07-03 ENCOUNTER — Other Ambulatory Visit: Payer: Self-pay

## 2021-07-03 VITALS — BP 135/84 | HR 65 | Temp 97.8°F | Ht 60.0 in | Wt 233.0 lb

## 2021-07-03 DIAGNOSIS — L659 Nonscarring hair loss, unspecified: Secondary | ICD-10-CM | POA: Diagnosis not present

## 2021-07-03 MED ORDER — SPIRONOLACTONE 25 MG PO TABS: 25.0000 mg | ORAL_TABLET | Freq: Every day | ORAL | 2 refills | Status: DC

## 2021-07-03 NOTE — Progress Notes (Signed)
Acute Office Visit  Subjective:    Patient ID: Susan [name], female    DOB: [birth year], 65 y.o.   MRN: [id number]  Chief Complaint  Patient presents with   Alopecia    HPI Patient is in today for changes in hair, worse in the last year. Coming out in patches. She thinks it has been worse since starting on the Norvasc and HCTZ for her blood pressure. She has tried some ointment on her hair from her dermatologist, Dr. Lois Huxley (fluocinonide 0.05% three times per week). Last saw her in May 2022 this year, next appt in Nov. Denies any family members with early hair loss. She has not had COVID-19 illness.   Past Medical History:  Diagnosis Date   Allergy    Anemia    Anxiety state, unspecified    Back injury    HLD (hyperlipidemia)    intol of statins - follows with lipid clinic   HTN (hypertension)    Irritable bowel syndrome    Lumbosacral spondylosis without myelopathy    improved with PT   Morbid obesity ([facility])     Past Surgical History:  Procedure Laterality Date   ABSCESS DRAINAGE     abdominal   CESAREAN SECTION  1977    Family History  Problem Relation Age of Onset   Breast cancer Mother    Diabetes Mother    Hypertension Mother    Multiple myeloma Mother    Diabetes Father        on dialysis   Kidney failure Father    Colon cancer Maternal Grandmother     Social History   Socioeconomic History   Marital status: Divorced    Spouse name: Not on file   Number of children: 1   Years of education: Not on file   Highest education level: Not on file  Occupational History   Occupation: Retired    Comment: Restaurant manager, 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   Tobacco Use   Smoking status: Never   Smokeless tobacco: Never  Vaping Use   Vaping Use: Never used  Substance and Sexual Activity   Alcohol use: No   Drug use: No   Sexual activity: Not Currently  Other Topics Concern   Not on file  Social History Narrative   Lives  alone, divorced since 42   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 1 each 0   amLODipine (NORVASC) 2.5 MG tablet Take 1 tablet (2.5 mg total) by mouth daily. 90 tablet 3   calcium citrate (CALCITRATE - DOSED IN MG ELEMENTAL CALCIUM) 950 (200 Ca) MG tablet Take 200 mg of elemental calcium by mouth daily.     diclofenac Sodium (VOLTAREN) 1 % GEL Apply 4 g to affected area up to 4 times daily.     docusate sodium (COLACE) 100 MG capsule Take 100 mg by mouth 2 (two) times daily.  ezetimibe (ZETIA) 10 MG tablet Take 1 tablet (10 mg total) by mouth daily. 90 tablet 3   finasteride (PROSCAR) 5 MG tablet Take by mouth. Takes half a pill daily     Fluocinolone Acetonide Body 0.01 % OIL Apply to scalp 3 times per week 120 mL 1   hydrochlorothiazide (HYDRODIURIL) 25 MG tablet Take 1 tablet (25 mg total) by mouth daily. 90 tablet 3   orphenadrine (NORFLEX) 100 MG tablet Take 100 mg by mouth as needed for muscle spasms.      ALPRAZolam (XANAX) 0.5 MG tablet Take 1 tablet (0.5 mg total) by mouth as needed for anxiety (prior to MRI). 2 tablet 0   No facility-administered medications prior to visit.    Allergies  Allergen Reactions   Aspirin     Other reaction(s): Bleeding   Avelox [Moxifloxacin Hcl In Nacl] Anaphylaxis    Pt c/o swelling of her throat and tongue   Shellfish Allergy Anaphylaxis and Hives    Rash and itching Rash and itching   Ibuprofen Other (See Comments)    Gastric bleeding Gastric  bleeding   Codeine Other (See Comments)    REACTION: causes her heart to race   Crestor [Rosuvastatin Calcium]     Pt states INTOLERANT w/ muscle cramps   Penicillins Hives    REACTION: rash   Pravastatin Sodium [Pravachol]     REACTION: intolerant= headache & insomnia   Simvastatin     REACTION: intolerant= headache & insomnia    Review of Systems REFER TO HPI FOR PERTINENT POSITIVES AND NEGATIVES     Objective:    Physical Exam Constitutional:      Appearance: Normal appearance.  Skin:    Comments: Several scattered patches on scalp of alopecia. No signs of infection or tinea.   Neurological:     Mental Status: She is alert.    BP 135/84   Pulse 65   Temp 97.8 F (36.6 C)   Ht 5' (1.524 m)   Wt 233 lb (105.7 kg)   SpO2 98%   BMI 45.50 kg/m  Wt Readings from Last 3 Encounters:  07/03/21 233 lb (105.7 kg)  02/14/21 232 lb (105.2 kg)  01/30/21 228 lb 12.8 oz (103.8 kg)    Health Maintenance Due  Topic Date Due   Zoster Vaccines- Shingrix (2 of 2) 08/31/2020   INFLUENZA VACCINE  05/22/2021   COVID-19 Vaccine (5 - Booster for Pfizer series) 06/30/2021    There are no preventive care reminders to display for this patient.   Lab Results  Component Value Date   TSH 0.47 11/03/2020   Lab Results  Component Value Date   WBC 8.2 01/09/2021   HGB 12.5 01/09/2021   HCT 38 01/09/2021   MCV 90.1 11/03/2020   PLT 398 01/09/2021   Lab Results  Component Value Date   NA 139 01/09/2021   K 3.7 01/09/2021   CO2 33 (A) 01/09/2021   GLUCOSE 91 11/03/2020   BUN 12 01/09/2021   CREATININE 0.7 01/09/2021   BILITOT 0.3 11/03/2020   ALKPHOS 70 01/09/2021   AST 17 01/09/2021   ALT 11 01/09/2021   PROT 7.8 11/03/2020   ALBUMIN 4.2 01/09/2021   CALCIUM 10.0 01/09/2021   ANIONGAP 13 02/07/2019   GFR 88.90 11/03/2020   Lab Results  Component Value Date   CHOL 214 (H) 11/03/2020   Lab Results  Component Value Date   HDL 79.80 11/03/2020   Lab Results   Component Value Date  [place] 110 (H) 11/03/2020   Lab Results  Component Value Date   TRIG 119.0 11/03/2020   Lab Results  Component Value Date   CHOLHDL 3 11/03/2020   Lab Results  Component Value Date   HGBA1C 6.0 11/03/2020       Assessment & Plan:   Problem List Items Addressed This Visit   None   1. Alopecia -Discussed with PCP Dr. Jerline Pain today -Will stop the Amlodipine and HCTZ and instead start Spironolactone for her BP -She will cont f/u with Dr. Leonie Green for ongoing hair loss issues   Diamante Truszkowski M Terrion Poblano, PA-C

## 2021-07-03 NOTE — Patient Instructions (Signed)
-  Continue your current regimen from dermatology and f/up with them as scheduled -STOP the amlodipine and HCTZ -START Spironolactone once daily -Monitor BP -Don't stress, be blessed!  -Call if any problems

## 2021-07-12 ENCOUNTER — Encounter: Payer: Self-pay | Admitting: Cardiology

## 2021-07-12 ENCOUNTER — Ambulatory Visit (INDEPENDENT_AMBULATORY_CARE_PROVIDER_SITE_OTHER): Payer: Medicare Other | Admitting: Cardiology

## 2021-07-12 DIAGNOSIS — Z0000 Encounter for general adult medical examination without abnormal findings: Secondary | ICD-10-CM | POA: Diagnosis not present

## 2021-07-12 NOTE — Progress Notes (Signed)
Subjective:   Susan [name] is a 65 y.o. female who presents for an Initial Medicare Annual Wellness Visit.  I connected with  Susan [name] on 07/12/21 by a video enabled telemedicine application and verified that I am speaking with the correct person using two identifiers.   I discussed the limitations of evaluation and management by telemedicine. The patient expressed understanding and agreed to proceed.   Location of Patient; home, Location of provider Person participating in visit, [name] and [name], RN  Review of Systems    Defer to PCP Cardiac Risk Factors include: advanced age (>81mn, >>29women)     Objective:    There were no vitals filed for this visit. There is no height or weight on file to calculate BMI.  Advanced Directives 07/12/2021  Does Patient Have a Medical Advance Directive? Yes  Type of [place]  Does patient want to make changes to medical advance directive? No - Patient declined  Copy of H[place]ain Chart? No - copy requested    Current Medications (verified) Outpatient Encounter Medications as of 07/12/2021  Medication Sig   albuterol (PROAIR HFA) 108 (90 Base) MCG/ACT inhaler Inhale 2 puffs into the lungs every 6 (six) hours as needed for wheezing or shortness of breath.   calcium citrate (CALCITRATE - DOSED IN MG ELEMENTAL CALCIUM) 950 (200 Ca) MG tablet Take 200 mg of elemental calcium by mouth daily.   diclofenac Sodium (VOLTAREN) 1 % GEL Apply 4 g to affected area up to 4 times daily.   docusate sodium (COLACE) 100 MG capsule Take 100 mg by mouth 2 (two) times daily.   ezetimibe (ZETIA) 10 MG tablet Take 1 tablet (10 mg total) by mouth daily.   Fluocinolone Acetonide Body 0.01 % OIL Apply to scalp 3 times per week   orphenadrine (NORFLEX) 100 MG tablet Take 100 mg by mouth as needed for muscle spasms.    spironolactone (ALDACTONE) 25  MG tablet Take 1 tablet (25 mg total) by mouth daily.   finasteride (PROSCAR) 5 MG tablet Take by mouth. Takes half a pill daily (Patient not taking: Reported on 07/12/2021)   No facility-administered encounter medications on file as of 07/12/2021.    Allergies (verified) Aspirin, Avelox [moxifloxacin hcl in nacl], Shellfish allergy, Ibuprofen, Codeine, Crestor [rosuvastatin calcium], Penicillins, Pravastatin sodium [pravachol], and Simvastatin   History: Past Medical History:  Diagnosis Date   Allergy    Anemia    Anxiety state, unspecified    Back injury    HLD (hyperlipidemia)    intol of statins - follows with lipid clinic   HTN (hypertension)    Irritable bowel syndrome    Lumbosacral spondylosis without myelopathy    improved with PT   Morbid obesity (HCC)    Past Surgical History:  Procedure Laterality Date   ABSCESS DRAINAGE     abdominal   CESAREAN SECTION  1977   Family History  Problem Relation Age of Onset   Breast cancer Mother    Diabetes Mother    Hypertension Mother    Multiple myeloma Mother    Diabetes Father        on dialysis   Kidney failure Father    Colon cancer Maternal Grandmother    Social History   Socioeconomic History   Marital status: Divorced    Spouse name: Not on file   Number of children: 1   Years of education: Not on  file   Highest education level: Not on file  Occupational History   Occupation: Retired    Comment: Restaurant manager, 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   Tobacco Use   Smoking status: Never   Smokeless tobacco: Never  Vaping Use   Vaping Use: Never used  Substance and Sexual Activity   Alcohol use: No   Drug use: No   Sexual activity: Not Currently  Other Topics Concern   Not on file  Social History Narrative   Lives alone, divorced since 34   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, clinical (histocompatibility and immunogenetics)   Social Determinants of Radio broadcast assistant Strain: Low Risk    Difficulty of Paying Living  Expenses: Not hard at all  Food Insecurity: No Food Insecurity   Worried About Charity fundraiser in the Last Year: Never true   Arboriculturist in the Last Year: Never true  Transportation Needs: No Transportation Needs   Lack of Transportation (Medical): No   Lack of Transportation (Non-Medical): No  Physical Activity: Insufficiently Active   Days of Exercise per Week: 4 days   Minutes of Exercise per Session: 30 min  Stress: Not on file  Social Connections: Socially Isolated   Frequency of Communication with Friends and Family: More than three times a week   Frequency of Social Gatherings with Friends and Family: Once a week   Attends Religious Services: Never   Marine scientist or Organizations: No   Attends Music therapist: Not on file   Marital Status: Divorced    Tobacco Counseling Counseling given: Not Answered   Clinical Intake:  Pre-visit preparation completed: No  Pain : No/denies pain     Nutritional Risks: Unintentional weight gain, Unintentional weight loss Diabetes: No  How often do you need to have someone help you when you read instructions, pamphlets, or other written materials from your doctor or pharmacy?: 1 - Never What is the last grade level you completed in school?: 15 years  Diabetic?No  Interpreter Needed?: No  Information entered by :: [name]   Activities of Daily Living In your present state of health, do you have any difficulty performing the following activities: 07/12/2021 11/03/2020  Hearing? N N  Vision? N N  Difficulty concentrating or making decisions? - N  Walking or climbing stairs? N N  Dressing or bathing? N N  Doing errands, shopping? N N  Preparing Food and eating ? N -  Using the Toilet? N -  In the past six months, have you accidently leaked urine? N -  Do you have problems with loss of bowel control? N -  Managing your Finances? N -  Housekeeping or managing your Housekeeping? N -  Some  recent data might be hidden    Patient Care Team: Vivi Barrack, MD as PCP - General (Family Medicine) Berle Mull, MD (Sports Medicine) Carol Ada, MD (Gastroenterology) Brien Few, MD (Obstetrics and Gynecology)  Indicate any recent Medical Services you may have received from other than [facility] providers in the past year (date may be approximate).     Assessment:   This is a routine wellness examination for Reegan.  Hearing/Vision screen No results found.  Dietary issues and exercise activities discussed: Current Exercise Habits: Home exercise routine, Type of exercise: walking, Time (Minutes): 30, Frequency (Times/Week): 3, Weekly Exercise (Minutes/Week): 90, Intensity: Moderate, Exercise limited by: None identified   Goals  Addressed   None   Depression Screen PHQ 2/9 Scores 07/12/2021 07/12/2021 11/03/2020 10/27/2019 01/29/2013  PHQ - 2 Score 0 0 0 0 1    Fall Risk Fall Risk  07/12/2021  Falls in the past year? 1  Number falls in past yr: 0  Injury with Fall? 0  Risk for fall due to : No Fall Risks    FALL RISK PREVENTION PERTAINING TO THE HOME:  Any stairs in or around the home? Yes  If so, are there any without handrails? No  Home free of loose throw rugs in walkways, pet beds, electrical cords, etc? No  Adequate lighting in your home to reduce risk of falls? Yes   ASSISTIVE DEVICES UTILIZED TO PREVENT FALLS:  Life alert? No  Use of a cane, walker or w/c? No  Grab bars in the bathroom? Yes  Shower chair or bench in shower? Yes  Elevated toilet seat or a handicapped toilet? Yes   TIMED UP AND GO:  Was the test performed?  N/A .  Length of time to ambulate 10 feet:  N /A sec.     Cognitive Function:     6CIT Screen 07/12/2021  What month? 0 points  What time? 0 points  Count back from 20 0 points  Months in reverse 0 points  Repeat phrase 0 points    Immunizations Immunization History  Administered Date(s) Administered   Fluad Quad(high  Dose 65+) 08/09/2020   Influenza,inj,Quad PF,6+ Mos 09/04/2019   PFIZER(Purple Top)SARS-COV-2 Vaccination 12/09/2019, 12/30/2019, 07/23/2020, 02/27/2021   Tdap 02/05/2014, 02/27/2016   Zoster Recombinat (Shingrix) 07/06/2020   Zoster, Live 01/06/2021    TDAP status: Due, Education has been provided regarding the importance of this vaccine. Advised may receive this vaccine at local pharmacy or Health Dept. Aware to provide a copy of the vaccination record if obtained from local pharmacy or Health Dept. Verbalized acceptance and understanding.  Flu Vaccine status: Up to date 06/2021 at [facility] Pneumococcal vaccine status: Due, Education has been provided regarding the importance of this vaccine. Advised may receive this vaccine at local pharmacy or Health Dept. Aware to provide a copy of the vaccination record if obtained from local pharmacy or Health Dept. Verbalized acceptance and understanding.  Covid-19 vaccine status: Completed vaccines  Qualifies for Shingles Vaccine? Yes   Zostavax completed Yes   Shingrix Completed?: No.    Education has been provided regarding the importance of this vaccine. Patient has been advised to call insurance company to determine out of pocket expense if they have not yet received this vaccine. Advised may also receive vaccine at local pharmacy or Health Dept. Verbalized acceptance and understanding.  Screening Tests Health Maintenance  Topic Date Due   Zoster Vaccines- Shingrix (2 of 2) 08/31/2020   INFLUENZA VACCINE  05/22/2021   COVID-19 Vaccine (5 - Booster for Pfizer series) 06/30/2021   MAMMOGRAM  09/20/2022   COLONOSCOPY (Pts 45-52yr Insurance coverage will need to be confirmed)  03/22/2023   TETANUS/TDAP  02/26/2026   DEXA SCAN  Completed   Hepatitis C Screening  Completed   HPV VACCINES  Aged Out    Health Maintenance  Health Maintenance Due  Topic Date Due   Zoster Vaccines- Shingrix (2 of 2) 08/31/2020   INFLUENZA VACCINE  05/22/2021    COVID-19 Vaccine (5 - Booster for Pfizer series) 06/30/2021    Colorectal cancer screening: Type of screening: Colonoscopy. Completed 2019. Repeat every 5 years  Mammogram status: Completed 2021. Repeat every year 2  Bone Density status: Completed 2021. Results reflect: Bone density results: NORMAL. Repeat every 0 years.  Lung Cancer Screening: (Low Dose CT Chest recommended if Age 62-80 years, 30 pack-year currently smoking OR have quit w/in 15years.) does not qualify.   Lung Cancer Screening Referral: N/A  Additional Screening:  Hepatitis C Screening: does qualify; Completed 2019  Vision Screening: Recommended annual ophthalmology exams for early detection of glaucoma and other disorders of the eye. Is the patient up to date with their annual eye exam?  Yes  Who is the provider or what is the name of the office in which the patient attends annual eye exams? [facility] If pt is not established with a provider, would they like to be referred to a provider to establish care? No .   Dental Screening: Recommended annual dental exams for proper oral hygiene  Community Resource Referral / Chronic Care Management: CRR required this visit?  No   CCM required this visit?  No      Plan:     I have personally reviewed and noted the following in the patient's chart:   Medical and social history Use of alcohol, tobacco or illicit drugs  Current medications and supplements including opioid prescriptions. Patient is not currently taking opioid prescriptions. Functional ability and status Nutritional status Physical activity Advanced directives List of other physicians Hospitalizations, surgeries, and ER visits in previous 12 months Vitals Screenings to include cognitive, depression, and falls Referrals and appointments  In addition, I have reviewed and discussed with patient certain preventive protocols, quality metrics, and best practice recommendations. A written personalized care  plan for preventive services as well as general preventive health recommendations were provided to patient.     [name], RN   07/12/2021   Nurse Notes:   Non-Face to Face 40 minutes visit   Ms. Maricela Bo , Thank you for taking time to come for your Medicare Wellness Visit. I appreciate your ongoing commitment to your health goals. Please review the following plan we discussed and let me know if I can assist you in the future.   These are the goals we discussed:  Goals      LDL CALC < 130        This is a list of the screening recommended for you and due dates:  Health Maintenance  Topic Date Due   Zoster (Shingles) Vaccine (2 of 2) 08/31/2020   Flu Shot  05/22/2021   COVID-19 Vaccine (5 - Booster for Pfizer series) 06/30/2021   Mammogram  09/20/2022   Colon Cancer Screening  03/22/2023   Tetanus Vaccine  02/26/2026   DEXA scan (bone density measurement)  Completed   Hepatitis C Screening: USPSTF Recommendation to screen - Ages 83-79 yo.  Completed   HPV Vaccine  Aged Out

## 2021-07-12 NOTE — Patient Instructions (Signed)
Health Maintenance, Female Adopting a healthy lifestyle and getting preventive care are important in promoting health and wellness. Ask your health care provider about: The right schedule for you to have regular tests and exams. Things you can do on your own to prevent diseases and keep yourself healthy. What should I know about diet, weight, and exercise? Eat a healthy diet  Eat a diet that includes plenty of vegetables, fruits, low-fat dairy products, and lean protein. Do not eat a lot of foods that are high in solid fats, added sugars, or sodium. Maintain a healthy weight Body mass index (BMI) is used to identify weight problems. It estimates body fat based on height and weight. Your health care provider can help determine your BMI and help you achieve or maintain a healthy weight. Get regular exercise Get regular exercise. This is one of the most important things you can do for your health. Most adults should: Exercise for at least 150 minutes each week. The exercise should increase your heart rate and make you sweat (moderate-intensity exercise). Do strengthening exercises at least twice a week. This is in addition to the moderate-intensity exercise. Spend less time sitting. Even light physical activity can be beneficial. Watch cholesterol and blood lipids Have your blood tested for lipids and cholesterol at 66 years of age, then have this test every 5 years. Have your cholesterol levels checked more often if: Your lipid or cholesterol levels are high. You are older than 66 years of age. You are at high risk for heart disease. What should I know about cancer screening? Depending on your health history and family history, you may need to have cancer screening at various ages. This may include screening for: Breast cancer. Cervical cancer. Colorectal cancer. Skin cancer. Lung cancer. What should I know about heart disease, diabetes, and high blood pressure? Blood pressure and heart  disease High blood pressure causes heart disease and increases the risk of stroke. This is more likely to develop in people who have high blood pressure readings, are of African descent, or are overweight. Have your blood pressure checked: Every 3-5 years if you are 18-39 years of age. Every year if you are 40 years old or older. Diabetes Have regular diabetes screenings. This checks your fasting blood sugar level. Have the screening done: Once every three years after age 40 if you are at a normal weight and have a low risk for diabetes. More often and at a younger age if you are overweight or have a high risk for diabetes. What should I know about preventing infection? Hepatitis B If you have a higher risk for hepatitis B, you should be screened for this virus. Talk with your health care provider to find out if you are at risk for hepatitis B infection. Hepatitis C Testing is recommended for: Everyone born from 1945 through 1965. Anyone with known risk factors for hepatitis C. Sexually transmitted infections (STIs) Get screened for STIs, including gonorrhea and chlamydia, if: You are sexually active and are younger than 66 years of age. You are older than 66 years of age and your health care provider tells you that you are at risk for this type of infection. Your sexual activity has changed since you were last screened, and you are at increased risk for chlamydia or gonorrhea. Ask your health care provider if you are at risk. Ask your health care provider about whether you are at high risk for HIV. Your health care provider may recommend a prescription medicine   to help prevent HIV infection. If you choose to take medicine to prevent HIV, you should first get tested for HIV. You should then be tested every 3 months for as long as you are taking the medicine. Pregnancy If you are about to stop having your period (premenopausal) and you may become pregnant, seek counseling before you get  pregnant. Take 400 to 800 micrograms (mcg) of folic acid every day if you become pregnant. Ask for birth control (contraception) if you want to prevent pregnancy. Osteoporosis and menopause Osteoporosis is a disease in which the bones lose minerals and strength with aging. This can result in bone fractures. If you are 65 years old or older, or if you are at risk for osteoporosis and fractures, ask your health care provider if you should: Be screened for bone loss. Take a calcium or vitamin D supplement to lower your risk of fractures. Be given hormone replacement therapy (HRT) to treat symptoms of menopause. Follow these instructions at home: Lifestyle Do not use any products that contain nicotine or tobacco, such as cigarettes, e-cigarettes, and chewing tobacco. If you need help quitting, ask your health care provider. Do not use street drugs. Do not share needles. Ask your health care provider for help if you need support or information about quitting drugs. Alcohol use Do not drink alcohol if: Your health care provider tells you not to drink. You are pregnant, may be pregnant, or are planning to become pregnant. If you drink alcohol: Limit how much you use to 0-1 drink a day. Limit intake if you are breastfeeding. Be aware of how much alcohol is in your drink. In the [place] one drink equals one 12 oz bottle of beer (355 mL), one 5 oz glass of wine (148 mL), or one 1 oz glass of hard liquor (44 mL). General instructions Schedule regular health, dental, and eye exams. Stay current with your vaccines. Tell your health care provider if: You often feel depressed. You have ever been abused or do not feel safe at home. Summary Adopting a healthy lifestyle and getting preventive care are important in promoting health and wellness. Follow your health care provider's instructions about healthy diet, exercising, and getting tested or screened for diseases. Follow your health care provider's  instructions on monitoring your cholesterol and blood pressure. This information is not intended to replace advice given to you by your health care provider. Make sure you discuss any questions you have with your health care provider. Document Revised: 12/16/2020 Document Reviewed: 10/01/2018 Elsevier Patient Education  2022 [employer]vier Inc.  

## 2021-07-26 ENCOUNTER — Telehealth: Payer: Self-pay

## 2021-07-26 MED ORDER — SPIRONOLACTONE 25 MG PO TABS: 25.0000 mg | ORAL_TABLET | Freq: Every day | ORAL | 2 refills | Status: DC

## 2021-07-26 NOTE — Telephone Encounter (Signed)
PATIENT IS REQUESTING A 90 DAY SUPPLY     Encourage patient to contact the pharmacy for refills or they can request refills through [place]:  Please schedule appointment if longer than 1 year  NEXT APPOINTMENT DATE:  MEDICATION:spironolactone (ALDACTONE) 25 MG tablet   Is the patient out of medication?   PHARMACY:[id number] - [place], [place]  Let patient know to contact pharmacy at the end of the day to make sure medication is ready.  Please notify patient to allow 48-72 hours to process  CLINICAL FILLS OUT ALL BELOW:   LAST REFILL:  QTY:  REFILL DATE:    OTHER COMMENTS:    Okay for refill?  Please advise

## 2021-07-26 NOTE — Telephone Encounter (Signed)
Refill sent.

## 2021-08-07 ENCOUNTER — Other Ambulatory Visit: Payer: Self-pay | Admitting: Obstetrics and Gynecology

## 2021-08-07 DIAGNOSIS — Z1231 Encounter for screening mammogram for malignant neoplasm of breast: Secondary | ICD-10-CM

## 2021-08-20 ENCOUNTER — Encounter: Payer: Self-pay | Admitting: Family Medicine

## 2021-08-29 ENCOUNTER — Encounter (HOSPITAL_BASED_OUTPATIENT_CLINIC_OR_DEPARTMENT_OTHER): Payer: Self-pay

## 2021-08-29 ENCOUNTER — Encounter: Payer: Self-pay | Admitting: Physician Assistant

## 2021-08-29 ENCOUNTER — Other Ambulatory Visit: Payer: Self-pay

## 2021-08-29 ENCOUNTER — Ambulatory Visit (INDEPENDENT_AMBULATORY_CARE_PROVIDER_SITE_OTHER): Payer: Medicare Other | Admitting: Physician Assistant

## 2021-08-29 ENCOUNTER — Ambulatory Visit (HOSPITAL_BASED_OUTPATIENT_CLINIC_OR_DEPARTMENT_OTHER)
Admission: RE | Admit: 2021-08-29 | Discharge: 2021-08-29 | Disposition: A | Discharge status: Home / Self Care | Payer: Medicare Other | Source: Ambulatory Visit | Attending: Physician Assistant | Admitting: Physician Assistant

## 2021-08-29 VITALS — BP 161/94 | HR 58 | Temp 97.6°F | Ht 60.0 in | Wt 237.2 lb

## 2021-08-29 DIAGNOSIS — S0990XA Unspecified injury of head, initial encounter: Secondary | ICD-10-CM | POA: Insufficient documentation

## 2021-08-29 DIAGNOSIS — M25511 Pain in right shoulder: Secondary | ICD-10-CM

## 2021-08-29 DIAGNOSIS — I10 Essential (primary) hypertension: Secondary | ICD-10-CM

## 2021-08-29 DIAGNOSIS — G8929 Other chronic pain: Secondary | ICD-10-CM

## 2021-08-29 DIAGNOSIS — M25512 Pain in left shoulder: Secondary | ICD-10-CM

## 2021-08-29 LAB — CBC WITH DIFFERENTIAL/PLATELET
Basophils Absolute: 0.1 10*3/uL (ref 0.0–0.1)
Basophils Relative: 0.7 % (ref 0.0–3.0)
Eosinophils Absolute: 0 10*3/uL (ref 0.0–0.7)
Eosinophils Relative: 0.5 % (ref 0.0–5.0)
HCT: 38.2 % (ref 36.0–46.0)
Hemoglobin: 12.6 g/dL (ref 12.0–15.0)
Lymphocytes Relative: 37.4 % (ref 12.0–46.0)
Lymphs Abs: 3 10*3/uL (ref 0.7–4.0)
MCHC: 33.1 g/dL (ref 30.0–36.0)
MCV: 90.6 fl (ref 78.0–100.0)
Monocytes Absolute: 0.5 10*3/uL (ref 0.1–1.0)
Monocytes Relative: 5.7 % (ref 3.0–12.0)
Neutro Abs: 4.5 10*3/uL (ref 1.4–7.7)
Neutrophils Relative %: 55.7 % (ref 43.0–77.0)
Platelets: 366 10*3/uL (ref 150.0–400.0)
RBC: 4.22 Mil/uL (ref 3.87–5.11)
RDW: 14.6 % (ref 11.5–15.5)
WBC: 8 10*3/uL (ref 4.0–10.5)

## 2021-08-29 LAB — COMPREHENSIVE METABOLIC PANEL
ALT: 9 U/L (ref 0–35)
AST: 15 U/L (ref 0–37)
Albumin: 4.3 g/dL (ref 3.5–5.2)
Alkaline Phosphatase: 76 U/L (ref 39–117)
BUN: 14 mg/dL (ref 6–23)
CO2: 30 mEq/L (ref 19–32)
Calcium: 10.1 mg/dL (ref 8.4–10.5)
Chloride: 100 mEq/L (ref 96–112)
Creatinine, Ser: 0.63 mg/dL (ref 0.40–1.20)
GFR: 92.22 mL/min (ref >60.00)
Glucose, Bld: 85 mg/dL (ref 70–99)
Potassium: 4.1 mEq/L (ref 3.5–5.1)
Sodium: 136 mEq/L (ref 135–145)
Total Bilirubin: 0.4 mg/dL (ref 0.2–1.2)
Total Protein: 7.7 g/dL (ref 6.0–8.3)

## 2021-08-29 NOTE — Progress Notes (Signed)
Susan [name] is a 65 y.o. female here for headaches and shoulder pain.  History of Present Illness:   Chief Complaint  Patient presents with  . Headache    Pt said she hit her forehead 4 days ago on the car, and now has headache x 3 days, sharp pain. She is Tylenol with little relief.  . Shoulder Pain    Pt c/o bilateral shoulder pain x several months.    HPI  Bilateral Shoulder Pain Susan [name] has been experiencing constant bilateral shoulder pain for several months. She is currently seeing Dr. Wess Botts for pain management of her lower back regularly. States she occasionally uses voltaren gel to manage he pain as well as norflex 100 mg which has provided minor relief. She is interested in undergoing pain management with this issue as well.   Headaches Susan [name] states she has been experiencing headaches for the past three days. Pt reports these started a day after hitting her forehead on her car, initially not thinking anything of it until it started to worsen. Susan [name] has described these headaches are sharp pains in her head and currently rates her pain a 6 out of 10.   She has been taking tylenol to manage her pain as well as resting which has provided minor relief.  Denies nausea, vomiting, blurred vision, double vision, or light sensitivity  HTN Currently compliant with taking aldactone 25 mg with no adverse effects. Patient denies chest pain, SOB, blurred vision, dizziness, unusual headaches, lower leg swelling. Denies excessive caffeine intake, stimulant usage, excessive alcohol intake, or increase in salt consumption.  BP Readings from Last 3 Encounters:  08/29/21 (!) 160/90  07/03/21 135/84  02/14/21 130/78    Past Medical History:  Diagnosis Date  . Allergy   . Anemia   . Anxiety state, unspecified   . Back injury   . HLD (hyperlipidemia)    intol of statins - follows with lipid clinic  . HTN (hypertension)   . Irritable bowel syndrome   .  Lumbosacral spondylosis without myelopathy    improved with PT  . Morbid obesity ([place])      Social History   Tobacco Use  . Smoking status: Never  . Smokeless tobacco: Never  Vaping Use  . Vaping Use: Never used  Substance Use Topics  . Alcohol use: No  . Drug use: No    Past Surgical History:  Procedure Laterality Date  . ABSCESS DRAINAGE     abdominal  . CESAREAN SECTION  1977    Family History  Problem Relation Age of Onset  . Breast cancer Mother   . Diabetes Mother   . Hypertension Mother   . Multiple myeloma Mother   . Diabetes Father        on dialysis  . Kidney failure Father   . Colon cancer Maternal Grandmother     Allergies  Allergen Reactions  . Aspirin     Other reaction(s): Bleeding  . Avelox [Moxifloxacin Hcl In Nacl] Anaphylaxis    Pt c/o swelling of her throat and tongue  . Shellfish Allergy Anaphylaxis and Hives    Rash and itching Rash and itching  . Ibuprofen Other (See Comments)    Gastric bleeding Gastric bleeding  . Codeine Other (See Comments)    REACTION: causes her heart to race  . Crestor [Rosuvastatin Calcium]     Pt states INTOLERANT w/ muscle cramps  . Penicillins Hives    REACTION: rash  . Pravastatin Sodium [Pravachol]  REACTION: intolerant= headache & insomnia  . Simvastatin     REACTION: intolerant= headache & insomnia    Current Medications:   Current Outpatient Medications:  .  albuterol (PROAIR HFA) 108 (90 Base) MCG/ACT inhaler, Inhale 2 puffs into the lungs every 6 (six) hours as needed for wheezing or shortness of breath., Disp: 1 each, Rfl: 0 .  calcium citrate (CALCITRATE - DOSED IN MG ELEMENTAL CALCIUM) 950 (200 Ca) MG tablet, Take 200 mg of elemental calcium by mouth daily., Disp: , Rfl:  .  diclofenac Sodium (VOLTAREN) 1 % GEL, Apply 4 g to affected area up to 4 times daily., Disp: , Rfl:  .  docusate sodium (COLACE) 100 MG capsule, Take 100 mg by mouth daily., Disp: , Rfl:  .  ezetimibe (ZETIA) 10 MG  tablet, Take 1 tablet (10 mg total) by mouth daily., Disp: 90 tablet, Rfl: 3 .  finasteride (PROSCAR) 5 MG tablet, Take by mouth. Takes half a pill daily, Disp: , Rfl:  .  Fluocinolone Acetonide Body 0.01 % OIL, Apply to scalp 3 times per week, Disp: 120 mL, Rfl: 1 .  orphenadrine (NORFLEX) 100 MG tablet, Take 100 mg by mouth as needed for muscle spasms. , Disp: , Rfl:  .  spironolactone (ALDACTONE) 25 MG tablet, Take 1 tablet (25 mg total) by mouth daily., Disp: 90 tablet, Rfl: 2   Review of Systems:   ROS Negative unless otherwise specified per HPI.  Vitals:   Vitals:   08/29/21 1310  BP: (!) 160/90  Pulse: (!) 58  Temp: 97.6 F (36.4 C)  TempSrc: Temporal  SpO2: 98%  Weight: 237 lb 4 oz (107.6 kg)  Height: 5' (1.524 m)     Body mass index is 46.33 kg/m.  Physical Exam:   Physical Exam Vitals and nursing note reviewed.  Constitutional:      General: She is not in acute distress.    Appearance: She is well-developed. She is not ill-appearing or toxic-appearing.  HENT:     Head:     Comments: Tenderness to right frontal area w/o obvious swelling or skin changes Cardiovascular:     Rate and Rhythm: Normal rate and regular rhythm.     Pulses: Normal pulses.     Heart sounds: Normal heart sounds, S1 normal and S2 normal.  Pulmonary:     Effort: Pulmonary effort is normal.     Breath sounds: Normal breath sounds.  Skin:    General: Skin is warm and dry.  Neurological:     General: No focal deficit present.     Mental Status: She is alert.     GCS: GCS eye subscore is 4. GCS verbal subscore is 5. GCS motor subscore is 6.     Cranial Nerves: Cranial nerves 2-12 are intact.     Sensory: Sensation is intact.     Motor: Motor function is intact.     Coordination: Coordination is intact.     Gait: Gait is intact.  Psychiatric:        Speech: Speech normal.        Behavior: Behavior normal. Behavior is cooperative.    Assessment and Plan:   Traumatic injury of head,  initial encounter Neuro exam benign Due to worsening HA and recent trauma, will obtain stat head CT Discussed that if sx worsen in the meantime, she needs to go to the ER  Chronic pain of both shoulders No red flags Already seeing pain management Will refer to physical therapy  for further evaluation Consider imaging if symptoms worsen/persist  Primary hypertension Uncontrolled Possibly due to pain Recommend follow-up with Dr. Jerline Pain for further evaluation if home BP remains consistently > 150/90   I,Havlyn C Ratchford,acting as a scribe for [employer], PA.,have documented all relevant documentation on the behalf of Inda Coke, PA,as directed by  Inda Coke, PA while in the presence of Inda Coke, [place].   I, Inda Coke, [place], have reviewed all documentation for this visit. The documentation on 08/29/21 for the exam, diagnosis, procedures, and orders are all accurate and complete.   Inda Coke, PA-C

## 2021-08-29 NOTE — Patient Instructions (Addendum)
It was great to see you!  We are ordering a stat head CT and blood work today  Please schedule an appointment with Lyndee Hensen here at our office at your convenience for physical therapy of your shoulder  Please keep an eye on your blood pressure, if remains >150/90, please make an appointment to discuss with Dr. Jerline Pain  If any worsening headache or other symptoms in the meantime, please go to the ER  Take care,  Inda Coke PA-C

## 2021-09-22 ENCOUNTER — Ambulatory Visit
Admission: RE | Admit: 2021-09-22 | Discharge: 2021-09-22 | Disposition: A | Discharge status: Home / Self Care | Payer: Medicare Other | Source: Ambulatory Visit | Attending: Obstetrics and Gynecology | Admitting: Obstetrics and Gynecology

## 2021-09-22 ENCOUNTER — Ambulatory Visit: Payer: Medicare Other

## 2021-09-22 DIAGNOSIS — Z1231 Encounter for screening mammogram for malignant neoplasm of breast: Secondary | ICD-10-CM

## 2021-09-25 ENCOUNTER — Other Ambulatory Visit: Payer: Self-pay

## 2021-09-25 ENCOUNTER — Ambulatory Visit (INDEPENDENT_AMBULATORY_CARE_PROVIDER_SITE_OTHER): Payer: Medicare Other | Admitting: Physical Therapy

## 2021-09-25 ENCOUNTER — Encounter: Payer: Self-pay | Admitting: Physical Therapy

## 2021-09-25 DIAGNOSIS — G8929 Other chronic pain: Secondary | ICD-10-CM

## 2021-09-25 DIAGNOSIS — M25511 Pain in right shoulder: Secondary | ICD-10-CM

## 2021-09-25 DIAGNOSIS — M25512 Pain in left shoulder: Secondary | ICD-10-CM

## 2021-09-25 NOTE — Therapy (Signed)
[facility] [address] [place], [place], [address] Phone: [phone]   Fax:  [phone]  Physical Therapy Evaluation  Patient Details  Name: Susan [name] MRN: [id number] Date of Birth: November 21, 1955 Referring Provider (PT): Inda Coke   Encounter Date: 09/25/2021   PT End of Session - 09/25/21 1300     Visit Number 1    Number of Visits 16    Date for PT Re-Evaluation 11/20/21    Authorization Type Medicare    PT Start Time 1211    PT Stop Time 1250    PT Time Calculation (min) 39 min    Activity Tolerance Patient tolerated treatment well    Behavior During Therapy [facility] for tasks assessed/performed             Past Medical History:  Diagnosis Date   Allergy    Anemia    Anxiety state, unspecified    Back injury    HLD (hyperlipidemia)    intol of statins - follows with lipid clinic   HTN (hypertension)    Irritable bowel syndrome    Lumbosacral spondylosis without myelopathy    improved with PT   Morbid obesity ([place])     Past Surgical History:  Procedure Laterality Date   ABSCESS DRAINAGE     abdominal   CESAREAN SECTION  1977    There were no vitals filed for this visit.    Subjective Assessment - 09/25/21 1213     Subjective Bil shoulder pain for about 2 years, on/off pain. Did have previous R pain, but now both sore. Increased pain with reaching, sleeping. Tightness in UTs as well.    Pertinent History HTN, Osteoporosis    Limitations Lifting;House hold activities    Patient Stated Goals decreased pain    Currently in Pain? Yes    Pain Score 6     Pain Location Shoulder    Pain Orientation Right;Left    Pain Descriptors / Indicators Aching    Pain Type Chronic pain    Pain Onset More than a month ago    Pain Frequency Intermittent    Aggravating Factors  reaching, overhead activity, sleeping on shoulders.                [facility] PT Assessment - 09/25/21 0001       Assessment    Medical Diagnosis Bil shoulder pain    Referring Provider (PT) Inda Coke    Prior Therapy no      Precautions   Precautions None      Balance Screen   Has the patient fallen in the past 6 months No      Prior Function   Level of Independence Independent      Cognition   Overall Cognitive Status Within Functional Limits for tasks assessed      ROM / Strength   AROM / PROM / Strength AROM;PROM;Strength      AROM   Overall AROM Comments R shoulder: mild hypomobility and limitation for flexion and abd, other motions WFL.      PROM   Overall PROM Comments mild limitation and pain with flexion on R; other motions WNL bil, ER painful bil      Strength   Overall Strength Comments shoulder flex: 4-/5, abd: 4-/5, IR/ER: 4/5, ER painful      Palpation   Palpation comment painful and weak elevation with strength testing, minimal weakness with ER, but is painful to test bil;  Objective measurements completed on examination: See above findings.       [facility] Adult PT Treatment/Exercise - 09/25/21 0001       Exercises   Exercises Shoulder      Shoulder Exercises: Supine   Flexion 10 reps;AAROM    Flexion Limitations cane      Shoulder Exercises: Seated   Other Seated Exercises scap retract x 10;      Shoulder Exercises: Sidelying   External Rotation 15 reps;Both      Shoulder Exercises: Stretch   Other Shoulder Stretches upper trap stretch 20 sec x 3 bil;      Manual Therapy   Manual Therapy Passive ROM    Passive ROM PROM for bil shoulders, flex, abd, IR/ER                     PT Education - 09/25/21 1259     Education Details PT POC, Exam findings, HEP    Person(s) Educated Patient    Methods Explanation;Demonstration;Tactile cues;Verbal cues;Handout    Comprehension Verbalized understanding;Returned demonstration;Verbal cues required;Tactile cues required;Need further instruction              PT  Short Term Goals - 09/25/21 1303       PT SHORT TERM GOAL #1   Title Pt to be independent with initial HEP    Time 2    Period Weeks    Status New    Target Date 10/09/21               PT Long Term Goals - 09/25/21 1303       PT LONG TERM GOAL #1   Title Pt to be independent with final HEP    Time 8    Period Weeks    Status New    Target Date 11/20/21      PT LONG TERM GOAL #2   Title Pt to report decreased pain in bil shoulders to 0-2/10  with activity and ADLs.    Time 8    Period Weeks    Status New    Target Date 11/20/21      PT LONG TERM GOAL #3   Title Pt to demo ability for overhead reaching, lifting, and sleeping with shoulder pain no greater than 2/10.    Time 8    Period Weeks    Status New    Target Date 11/20/21      PT LONG TERM GOAL #4   Title Pt to demo improved strength of bil shoulders to at least 4+/5 to improve ability for reaching, lifting, carrying and IADLs.    Time 8    Period Weeks    Status New    Target Date 11/20/21                    Plan - 09/25/21 1308     Clinical Impression Statement Pt presents with primary complaint of increased pain in bilateral shoulders. She has pain, with mild stiffness and limitation with full flexion R>L. She has pain with resisted ER bilaterally, and weakness for elevation and repeated motions for elevation. She also has pain and tenderness into bil UTs, as well as difficulty sleeping due to pain. Pt with decreased ability for full functional activities due to pain and deficit. Pt to benefit from skilled PT to improve deficits and pain.    Personal Factors and Comorbidities Time since onset of injury/illness/exacerbation    Examination-Activity Limitations Reach Overhead;Lift;Carry;Dressing  Examination-Participation Restrictions Cleaning;Meal Prep;Community Activity;Laundry    Stability/Clinical Decision Making Stable/Uncomplicated    Clinical Decision Making Low    Rehab Potential Good     PT Frequency 2x / week    PT Duration 8 weeks    PT Treatment/Interventions ADLs/Self Care Home Management;Cryotherapy;Electrical Stimulation;Iontophoresis 4mg /ml Dexamethasone;Moist Heat;Ultrasound;Functional mobility training;Therapeutic activities;Therapeutic exercise;Neuromuscular re-education;Manual techniques;Patient/family education;Passive range of motion;Dry needling;Taping;Vasopneumatic Device;Spinal Manipulations;Joint Manipulations    PT Home Exercise Plan [email or web address]    Consulted and Agree with Plan of Care Patient             Patient will benefit from skilled therapeutic intervention in order to improve the following deficits and impairments:  Decreased range of motion, Increased muscle spasms, Decreased activity tolerance, Pain, Hypomobility, Decreased mobility, Decreased strength  Visit Diagnosis: Chronic left shoulder pain  Chronic right shoulder pain     Problem List Patient Active Problem List   Diagnosis Date Noted   Fibroids 01/30/2021   Closed nondisplaced fracture of distal phalanx of right thumb 10/19/2020   Osteoporosis with last DEXA 02/2020 03/15/2020   Alopecia 08/21/2019   Hemorrhoid 08/21/2019   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

## 2021-09-25 NOTE — Patient Instructions (Signed)
Access Code: [email or web address] URL: [email or web address] Date: 09/25/2021 Prepared by: Lyndee Hensen  Exercises Supine Shoulder Flexion Extension AAROM with [place]el - 1 x daily - 1 sets - 10 reps Sidelying Shoulder External Rotation - 1 x daily - 1-2 sets - 10 reps Seated Scapular Retraction - 1-2 x daily - 1 sets - 10 reps Seated Cervical Sidebending Stretch - 2 x daily - 3 reps - 15-20 hold

## 2021-10-05 ENCOUNTER — Ambulatory Visit (INDEPENDENT_AMBULATORY_CARE_PROVIDER_SITE_OTHER): Payer: Medicare Other | Admitting: Physical Therapy

## 2021-10-05 ENCOUNTER — Other Ambulatory Visit: Payer: Self-pay

## 2021-10-05 ENCOUNTER — Encounter: Payer: Self-pay | Admitting: Physical Therapy

## 2021-10-05 DIAGNOSIS — M25512 Pain in left shoulder: Secondary | ICD-10-CM

## 2021-10-05 DIAGNOSIS — M25511 Pain in right shoulder: Secondary | ICD-10-CM

## 2021-10-05 DIAGNOSIS — G8929 Other chronic pain: Secondary | ICD-10-CM

## 2021-10-05 NOTE — Therapy (Signed)
[facility] [address] [place], [place], [address] Phone: [phone]   Fax:  [phone]  Physical Therapy Treatment  Patient Details  Name: Susan [name] MRN: [id number] Date of Birth: 19-Aug-1956 Referring Provider (PT): Inda Coke   Encounter Date: 10/05/2021   PT End of Session - 10/05/21 1420     Visit Number 2    Number of Visits 16    Date for PT Re-Evaluation 11/20/21    Authorization Type Medicare    PT Start Time 1408    PT Stop Time 1448    PT Time Calculation (min) 40 min    Activity Tolerance Patient tolerated treatment well    Behavior During Therapy [facility] for tasks assessed/performed             Past Medical History:  Diagnosis Date   Allergy    Anemia    Anxiety state, unspecified    Back injury    HLD (hyperlipidemia)    intol of statins - follows with lipid clinic   HTN (hypertension)    Irritable bowel syndrome    Lumbosacral spondylosis without myelopathy    improved with PT   Morbid obesity ([place])     Past Surgical History:  Procedure Laterality Date   ABSCESS DRAINAGE     abdominal   CESAREAN SECTION  1977    There were no vitals filed for this visit.   Subjective Assessment - 10/05/21 1419     Subjective Pt states mild soreness in shoulders, thinks they are doing a bit better, has been doing HEP.    Currently in Pain? Yes    Pain Score 4     Pain Location Shoulder    Pain Orientation Right;Left    Pain Descriptors / Indicators Aching    Pain Type Chronic pain    Pain Onset More than a month ago    Pain Frequency Intermittent                               [facility] Adult PT Treatment/Exercise - 10/05/21 0001       Shoulder Exercises: Supine   Flexion AAROM;15 reps    Flexion Limitations cane      Shoulder Exercises: Sidelying   External Rotation 15 reps;Both    External Rotation Weight (lbs) 2      Shoulder Exercises: Standing   External  Rotation 15 reps    Theraband Level (Shoulder External Rotation) Level 1 (Yellow)    Flexion AROM;15 reps    Flexion Limitations scaption    Other Standing Exercises Wall slides 1 UE x 15 on L and R;    Other Standing Exercises Scap squeeze x 15;      Shoulder Exercises: Pulleys   Flexion 3 minutes      Shoulder Exercises: Stretch   Other Shoulder Stretches upper trap stretch 20 sec x 3 bil;      Manual Therapy   Manual Therapy Joint mobilization;Soft tissue mobilization    Manual therapy comments ,    Joint Mobilization Post mobs for bil GHJ, LAD    Soft tissue mobilization DTM/TPR to R U T    Passive ROM PROM for bil shoulders, flex, abd, IR/ER, manual stretching for bil UTs.                       PT Short Term Goals - 09/25/21 1303  PT SHORT TERM GOAL #1   Title Pt to be independent with initial HEP    Time 2    Period Weeks    Status New    Target Date 10/09/21               PT Long Term Goals - 09/25/21 1303       PT LONG TERM GOAL #1   Title Pt to be independent with final HEP    Time 8    Period Weeks    Status New    Target Date 11/20/21      PT LONG TERM GOAL #2   Title Pt to report decreased pain in bil shoulders to 0-2/10  with activity and ADLs.    Time 8    Period Weeks    Status New    Target Date 11/20/21      PT LONG TERM GOAL #3   Title Pt to demo ability for overhead reaching, lifting, and sleeping with shoulder pain no greater than 2/10.    Time 8    Period Weeks    Status New    Target Date 11/20/21      PT LONG TERM GOAL #4   Title Pt to demo improved strength of bil shoulders to at least 4+/5 to improve ability for reaching, lifting, carrying and IADLs.    Time 8    Period Weeks    Status New    Target Date 11/20/21                   Plan - 10/05/21 1453     Clinical Impression Statement Pt with good ability for ther ex today, with minimal pain. She has mild sorness at end range for flexion, and  with repeated motions for ER. She has tightness and soreness in R UT, addressed wtih DTM/TPR today for decreased tension and pain. Pt to benefit from continued strengthening and postural education.    Personal Factors and Comorbidities Time since onset of injury/illness/exacerbation    Examination-Activity Limitations Reach Overhead;Lift;Carry;Dressing    Examination-Participation Restrictions Cleaning;Meal Prep;Community Activity;Laundry    Stability/Clinical Decision Making Stable/Uncomplicated    Rehab Potential Good    PT Frequency 2x / week    PT Duration 8 weeks    PT Treatment/Interventions ADLs/Self Care Home Management;Cryotherapy;Electrical Stimulation;Iontophoresis 4mg /ml Dexamethasone;Moist Heat;Ultrasound;Functional mobility training;Therapeutic activities;Therapeutic exercise;Neuromuscular re-education;Manual techniques;Patient/family education;Passive range of motion;Dry needling;Taping;Vasopneumatic Device;Spinal Manipulations;Joint Manipulations    PT Home Exercise Plan [email or web address]    Consulted and Agree with Plan of Care Patient             Patient will benefit from skilled therapeutic intervention in order to improve the following deficits and impairments:  Decreased range of motion, Increased muscle spasms, Decreased activity tolerance, Pain, Hypomobility, Decreased mobility, Decreased strength  Visit Diagnosis: Chronic left shoulder pain  Chronic right shoulder pain     Problem List Patient Active Problem List   Diagnosis Date Noted   Fibroids 01/30/2021   Closed nondisplaced fracture of distal phalanx of right thumb 10/19/2020   Osteoporosis with last DEXA 02/2020 03/15/2020   Alopecia 08/21/2019   Hemorrhoid 08/21/2019   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

## 2021-10-09 ENCOUNTER — Ambulatory Visit (INDEPENDENT_AMBULATORY_CARE_PROVIDER_SITE_OTHER): Payer: Medicare Other | Admitting: Physical Therapy

## 2021-10-09 ENCOUNTER — Other Ambulatory Visit: Payer: Self-pay

## 2021-10-09 DIAGNOSIS — M25511 Pain in right shoulder: Secondary | ICD-10-CM | POA: Diagnosis not present

## 2021-10-09 DIAGNOSIS — G8929 Other chronic pain: Secondary | ICD-10-CM | POA: Diagnosis not present

## 2021-10-09 DIAGNOSIS — M25512 Pain in left shoulder: Secondary | ICD-10-CM

## 2021-10-09 NOTE — Therapy (Signed)
[facility] [address] [place], [place], [address] Phone: [phone]   Fax:  [phone]  Physical Therapy Treatment  Patient Details  Name: Susan [name] MRN: [id number] Date of Birth: Mar 10, 1956 Referring Provider (PT): Inda Coke   Encounter Date: 10/09/2021   PT End of Session - 10/09/21 1433     Visit Number 3    Number of Visits 16    Date for PT Re-Evaluation 11/20/21    Authorization Type Medicare    PT Start Time 1749    PT Stop Time 1513    PT Time Calculation (min) 44 min    Activity Tolerance Patient tolerated treatment well    Behavior During Therapy [facility] for tasks assessed/performed             Past Medical History:  Diagnosis Date   Allergy    Anemia    Anxiety state, unspecified    Back injury    HLD (hyperlipidemia)    intol of statins - follows with lipid clinic   HTN (hypertension)    Irritable bowel syndrome    Lumbosacral spondylosis without myelopathy    improved with PT   Morbid obesity ([place])     Past Surgical History:  Procedure Laterality Date   ABSCESS DRAINAGE     abdominal   CESAREAN SECTION  1977    There were no vitals filed for this visit.   Subjective Assessment - 10/09/21 1417     Subjective Patient states that her shoulders are better today. States that she has changed her pillows to one instead of 2 and that helped. Reports that she was really sore after last session. Reports 3/10 pain in her shoulders right.    Currently in Pain? Yes    Pain Score 3     Pain Location Shoulder    Pain Orientation Left;Right    Pain Descriptors / Indicators Aching;Sore    Pain Type Chronic pain    Pain Onset More than a month ago                [facility] PT Assessment - 10/09/21 0001       Assessment   Medical Diagnosis Bil shoulder pain    Referring Provider (PT) Inda Coke                           [facility] Adult PT Treatment/Exercise -  10/09/21 0001       Shoulder Exercises: Supine   Protraction AAROM   with dowel x2 reps at 1 minute   Flexion AAROM;Both   with dowel x2 reps 1 minute     Shoulder Exercises: Seated   Other Seated Exercises shoulder rolls fwd and bkwd x10 Bil and Each      Shoulder Exercises: Sidelying   External Rotation 10 reps;Both   3 sets   External Rotation Weight (lbs) 2    Other Sidelying Exercises book stretch x15 5" holds B      Shoulder Exercises: Standing   External Rotation AROM   4x6 3 holds B with towel behind shoulders at wall   Theraband Level (Shoulder External Rotation) Level 2 (Red)    Flexion AROM;Strengthening;Both;12 reps   2 sets with perpendicular pull into ADD for ER activation     Modalities   Modalities Moist Heat      Moist Heat Therapy   Number Minutes Moist Heat 10 Minutes    Moist Heat Location  Shoulder   bilateral     Manual Therapy   Manual Therapy Manual Traction;Soft tissue mobilization    Manual therapy comments all manual interventions performed independent of other interventions    Joint Mobilization PA to GHJ B, traction of shoulders -felt good -    Soft tissue mobilization STM to posterior cuff R - not tolerated well - stopped                     PT Education - 10/09/21 1446     Education Details on form and rationale for exercises    Person(s) Educated Patient    Methods Explanation    Comprehension Verbalized understanding              PT Short Term Goals - 09/25/21 1303       PT SHORT TERM GOAL #1   Title Pt to be independent with initial HEP    Time 2    Period Weeks    Status New    Target Date 10/09/21               PT Long Term Goals - 09/25/21 1303       PT LONG TERM GOAL #1   Title Pt to be independent with final HEP    Time 8    Period Weeks    Status New    Target Date 11/20/21      PT LONG TERM GOAL #2   Title Pt to report decreased pain in bil shoulders to 0-2/10  with activity and ADLs.     Time 8    Period Weeks    Status New    Target Date 11/20/21      PT LONG TERM GOAL #3   Title Pt to demo ability for overhead reaching, lifting, and sleeping with shoulder pain no greater than 2/10.    Time 8    Period Weeks    Status New    Target Date 11/20/21      PT LONG TERM GOAL #4   Title Pt to demo improved strength of bil shoulders to at least 4+/5 to improve ability for reaching, lifting, carrying and IADLs.    Time 8    Period Weeks    Status New    Target Date 11/20/21                   Plan - 10/09/21 1441     Clinical Impression Statement Overall patient tolerated session very well. Able to progress exercises without increase in pain. Tactile and verbal cues for form throughout. Tolerated heat well reporting reduction in soreness after heat application which was donned during supine ROM exercises. Will continue with current POC as tolerated. Added book stretch to HEP.    Personal Factors and Comorbidities Time since onset of injury/illness/exacerbation    Examination-Activity Limitations Reach Overhead;Lift;Carry;Dressing    Examination-Participation Restrictions Cleaning;Meal Prep;Community Activity;Laundry    Stability/Clinical Decision Making Stable/Uncomplicated    Rehab Potential Good    PT Frequency 2x / week    PT Duration 8 weeks    PT Treatment/Interventions ADLs/Self Care Home Management;Cryotherapy;Electrical Stimulation;Iontophoresis 4mg /ml Dexamethasone;Moist Heat;Ultrasound;Functional mobility training;Therapeutic activities;Therapeutic exercise;Neuromuscular re-education;Manual techniques;Patient/family education;Passive range of motion;Dry needling;Taping;Vasopneumatic Device;Spinal Manipulations;Joint Manipulations    PT Home Exercise Plan [id number]    Consulted and Agree with Plan of Care Patient             Patient will benefit from skilled therapeutic intervention in order  to improve the following deficits and impairments:  Decreased  range of motion, Increased muscle spasms, Decreased activity tolerance, Pain, Hypomobility, Decreased mobility, Decreased strength  Visit Diagnosis: Chronic left shoulder pain  Chronic right shoulder pain     Problem List Patient Active Problem List   Diagnosis Date Noted   Fibroids 01/30/2021   Closed nondisplaced fracture of distal phalanx of right thumb 10/19/2020   Osteoporosis with last DEXA 02/2020 03/15/2020   Alopecia 08/21/2019   Hemorrhoid 08/21/2019   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   Skin inflammation 04/21/2015   VENOUS INSUFFICIENCY 08/18/2010   SPONDYLOSIS, LUMBAR 01/17/2009   Dyslipidemia 11/26/2007   Essential hypertension 11/25/2007   3:23 PM, 10/09/21 Jerene Pitch, DPT Physical Therapy with [facility]  [phone] office   [place], [place], [address] Phone: [phone]   Fax:  [phone]  Name: Susan [name] MRN: [id number] Date of Birth: 02-18-1956

## 2021-10-19 ENCOUNTER — Ambulatory Visit (INDEPENDENT_AMBULATORY_CARE_PROVIDER_SITE_OTHER): Payer: Medicare Other | Admitting: Physical Therapy

## 2021-10-19 ENCOUNTER — Other Ambulatory Visit: Payer: Self-pay

## 2021-10-19 ENCOUNTER — Encounter: Payer: Self-pay | Admitting: Physical Therapy

## 2021-10-19 DIAGNOSIS — G8929 Other chronic pain: Secondary | ICD-10-CM | POA: Diagnosis not present

## 2021-10-19 DIAGNOSIS — M25512 Pain in left shoulder: Secondary | ICD-10-CM | POA: Diagnosis not present

## 2021-10-19 DIAGNOSIS — M25511 Pain in right shoulder: Secondary | ICD-10-CM | POA: Diagnosis not present

## 2021-10-19 NOTE — Therapy (Signed)
[facility] [address] [place], [place], [address] Phone: [phone]   Fax:  [phone]  Physical Therapy Treatment  Patient Details  Name: Susan [name] MRN: [id number] Date of Birth: Dec 07, 1955 Referring Provider (PT): Inda Coke   Encounter Date: 10/19/2021   PT End of Session - 10/19/21 1421     Visit Number 4    Number of Visits 16    Date for PT Re-Evaluation 11/20/21    Authorization Type Medicare    PT Start Time 1430    PT Stop Time 1513    PT Time Calculation (min) 43 min    Activity Tolerance Patient tolerated treatment well    Behavior During Therapy [facility] for tasks assessed/performed             Past Medical History:  Diagnosis Date   Allergy    Anemia    Anxiety state, unspecified    Back injury    HLD (hyperlipidemia)    intol of statins - follows with lipid clinic   HTN (hypertension)    Irritable bowel syndrome    Lumbosacral spondylosis without myelopathy    improved with PT   Morbid obesity ([place])     Past Surgical History:  Procedure Laterality Date   ABSCESS DRAINAGE     abdominal   CESAREAN SECTION  1977    There were no vitals filed for this visit.   Subjective Assessment - 10/19/21 1421     Subjective States that she was sore after last session in both shoulders for the first two nights and she woke up that night. States no current pain in shoulders but some in back. Reports current pain 7/10.    Currently in Pain? Yes    Pain Score 7     Pain Location Shoulder    Pain Onset More than a month ago                [facility] PT Assessment - 10/19/21 0001       Assessment   Medical Diagnosis Bil shoulder pain    Referring Provider (PT) Inda Coke                           [facility] Adult PT Treatment/Exercise - 10/19/21 0001       Shoulder Exercises: Supine   Flexion AAROM;Both;20 reps   dowel AAROM     Shoulder Exercises: Seated    Flexion 20 reps;Both   5" holds pulleys   Abduction 20 reps;Both   5" holds with pulleys   Other Seated Exercises posture holds in chair- tactile and verbal cues x15 5" holds      Shoulder Exercises: Prone   Other Prone Exercises childs pose with pillow behind butt x10 10" holds    Other Prone Exercises quadruped mad horse/cow 3x5 with tactile and verbal cues      Shoulder Exercises: Standing   Protraction Strengthening;5 reps;Both;Theraband   3 sets   Theraband Level (Shoulder Protraction) Level 2 (Red)    Other Standing Exercises shoulder retraction with towel roll down back at wall x20 5" holds                       PT Short Term Goals - 09/25/21 1303       PT SHORT TERM GOAL #1   Title Pt to be independent with initial HEP    Time 2  Period Weeks    Status New    Target Date 10/09/21               PT Long Term Goals - 09/25/21 1303       PT LONG TERM GOAL #1   Title Pt to be independent with final HEP    Time 8    Period Weeks    Status New    Target Date 11/20/21      PT LONG TERM GOAL #2   Title Pt to report decreased pain in bil shoulders to 0-2/10  with activity and ADLs.    Time 8    Period Weeks    Status New    Target Date 11/20/21      PT LONG TERM GOAL #3   Title Pt to demo ability for overhead reaching, lifting, and sleeping with shoulder pain no greater than 2/10.    Time 8    Period Weeks    Status New    Target Date 11/20/21      PT LONG TERM GOAL #4   Title Pt to demo improved strength of bil shoulders to at least 4+/5 to improve ability for reaching, lifting, carrying and IADLs.    Time 8    Period Weeks    Status New    Target Date 11/20/21                   Plan - 10/19/21 1551     Clinical Impression Statement Patient tolerated session well. Advanced to upright exercises and focused on posture and form. Discussed and educated patient on latissimus dorsi muscle and how that plays into lumbar and shoulder  ROM and pain. Added quadruped exercises to POC but did not add mad horse/cow exercise to HEP secondary to patient requiring constant verbal and tactile cues for form. Will continue with current POC as tolerated.    Personal Factors and Comorbidities Time since onset of injury/illness/exacerbation    Examination-Activity Limitations Reach Overhead;Lift;Carry;Dressing    Examination-Participation Restrictions Cleaning;Meal Prep;Community Activity;Laundry    Stability/Clinical Decision Making Stable/Uncomplicated    Rehab Potential Good    PT Frequency 2x / week    PT Duration 8 weeks    PT Treatment/Interventions ADLs/Self Care Home Management;Cryotherapy;Electrical Stimulation;Iontophoresis 4mg /ml Dexamethasone;Moist Heat;Ultrasound;Functional mobility training;Therapeutic activities;Therapeutic exercise;Neuromuscular re-education;Manual techniques;Patient/family education;Passive range of motion;Dry needling;Taping;Vasopneumatic Device;Spinal Manipulations;Joint Manipulations    PT Home Exercise Plan [id number]    Consulted and Agree with Plan of Care Patient             Patient will benefit from skilled therapeutic intervention in order to improve the following deficits and impairments:  Decreased range of motion, Increased muscle spasms, Decreased activity tolerance, Pain, Hypomobility, Decreased mobility, Decreased strength  Visit Diagnosis: Chronic left shoulder pain  Chronic right shoulder pain     Problem List Patient Active Problem List   Diagnosis Date Noted   Fibroids 01/30/2021   Closed nondisplaced fracture of distal phalanx of right thumb 10/19/2020   Osteoporosis with last DEXA 02/2020 03/15/2020   Alopecia 08/21/2019   Hemorrhoid 08/21/2019   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   Skin inflammation 04/21/2015   VENOUS INSUFFICIENCY 08/18/2010   SPONDYLOSIS, LUMBAR 01/17/2009   Dyslipidemia 11/26/2007   Essential hypertension 11/25/2007   3:54 PM, 10/19/21 Jerene Pitch,  DPT Physical Therapy with [facility]  [phone] office   [facility] [address] [place], [place], [address] Phone: [phone]   Fax:  [phone]  Name: Susan [name] MRN: [id number] Date of Birth: 1956-04-08

## 2021-10-25 ENCOUNTER — Encounter: Payer: Medicare Other | Admitting: Physical Therapy

## 2021-10-26 ENCOUNTER — Ambulatory Visit (INDEPENDENT_AMBULATORY_CARE_PROVIDER_SITE_OTHER): Payer: Medicare Other | Admitting: Physical Therapy

## 2021-10-26 ENCOUNTER — Encounter: Payer: Self-pay | Admitting: Physical Therapy

## 2021-10-26 ENCOUNTER — Other Ambulatory Visit: Payer: Self-pay

## 2021-10-26 DIAGNOSIS — M25512 Pain in left shoulder: Secondary | ICD-10-CM

## 2021-10-26 DIAGNOSIS — M25511 Pain in right shoulder: Secondary | ICD-10-CM | POA: Diagnosis not present

## 2021-10-26 DIAGNOSIS — G8929 Other chronic pain: Secondary | ICD-10-CM

## 2021-10-26 NOTE — Therapy (Signed)
[facility] [address] [place], [place], [address] Phone: [phone]   Fax:  [phone]  Physical Therapy Treatment  Patient Details  Name: Susan [name] MRN: [id number] Date of Birth: [birth year] Referring Provider (PT): Inda Coke   Encounter Date: 10/26/2021   PT End of Session - 10/26/21 1007     Visit Number 5    Number of Visits 16    Date for PT Re-Evaluation 11/20/21    Authorization Type Medicare    PT Start Time 1008    PT Stop Time 1048    PT Time Calculation (min) 40 min    Activity Tolerance Patient tolerated treatment well    Behavior During Therapy [facility] for tasks assessed/performed             Past Medical History:  Diagnosis Date   Allergy    Anemia    Anxiety state, unspecified    Back injury    HLD (hyperlipidemia)    intol of statins - follows with lipid clinic   HTN (hypertension)    Irritable bowel syndrome    Lumbosacral spondylosis without myelopathy    improved with PT   Morbid obesity ([place])     Past Surgical History:  Procedure Laterality Date   ABSCESS DRAINAGE     abdominal   CESAREAN SECTION  1977    There were no vitals filed for this visit.   Subjective Assessment - 10/26/21 1008     Subjective Felt great after last session and not current symptoms in shoulder.    Currently in Pain? No/denies    Pain Onset More than a month ago                [facility] PT Assessment - 10/26/21 0001       Assessment   Medical Diagnosis Bil shoulder pain    Referring Provider (PT) Inda Coke                           [facility] Adult PT Treatment/Exercise - 10/26/21 0001       Shoulder Exercises: Seated   Row 5 reps;Both   yellow band around wrists, 5 sets     Shoulder Exercises: Prone   Other Prone Exercises childs pose with pillow behind butt 10" holds, 2 minutes total    Other Prone Exercises quadruped mad horse/cow 3x5 with tactile and verbal  cues- still very difficult for patient to perform      Shoulder Exercises: Sidelying   External Rotation 10 reps;Both   2 sets   External Rotation Weight (lbs) 3      Shoulder Exercises: Standing   Flexion AAROM;Both;10 reps   2 sets with dowel at wall for form   Other Standing Exercises shoulder retraction with towel roll down back at wall x20 5" holds    Other Standing Exercises wall and walks with yellow band at elbows 4x5 Bilat - verbal cues thorughout      Shoulder Exercises: Stretch   Other Shoulder Stretches doorway pec stretch x5 10" holds B                       PT Short Term Goals - 09/25/21 1303       PT SHORT TERM GOAL #1   Title Pt to be independent with initial HEP    Time 2    Period Weeks    Status New  Target Date 10/09/21               PT Long Term Goals - 09/25/21 1303       PT LONG TERM GOAL #1   Title Pt to be independent with final HEP    Time 8    Period Weeks    Status New    Target Date 11/20/21      PT LONG TERM GOAL #2   Title Pt to report decreased pain in bil shoulders to 0-2/10  with activity and ADLs.    Time 8    Period Weeks    Status New    Target Date 11/20/21      PT LONG TERM GOAL #3   Title Pt to demo ability for overhead reaching, lifting, and sleeping with shoulder pain no greater than 2/10.    Time 8    Period Weeks    Status New    Target Date 11/20/21      PT LONG TERM GOAL #4   Title Pt to demo improved strength of bil shoulders to at least 4+/5 to improve ability for reaching, lifting, carrying and IADLs.    Time 8    Period Weeks    Status New    Target Date 11/20/21                   Plan - 10/26/21 1051     Clinical Impression Statement Patient requires tactile cues with initiation of all exercises for form, but for most exercises is able to progress to occasional verbal cues.  Added rows with band around wrists which was tolerated well but patient required constant verbal cues  to keep pressing out into yellow band. Mild soreness in shoulders noted from fatigue end of session. Will continue with current POC.    Personal Factors and Comorbidities Time since onset of injury/illness/exacerbation    Examination-Activity Limitations Reach Overhead;Lift;Carry;Dressing    Examination-Participation Restrictions Cleaning;Meal Prep;Community Activity;Laundry    Stability/Clinical Decision Making Stable/Uncomplicated    Rehab Potential Good    PT Frequency 2x / week    PT Duration 8 weeks    PT Treatment/Interventions ADLs/Self Care Home Management;Cryotherapy;Electrical Stimulation;Iontophoresis 4mg /ml Dexamethasone;Moist Heat;Ultrasound;Functional mobility training;Therapeutic activities;Therapeutic exercise;Neuromuscular re-education;Manual techniques;Patient/family education;Passive range of motion;Dry needling;Taping;Vasopneumatic Device;Spinal Manipulations;Joint Manipulations    PT Home Exercise Plan [id number]    Consulted and Agree with Plan of Care Patient             Patient will benefit from skilled therapeutic intervention in order to improve the following deficits and impairments:  Decreased range of motion, Increased muscle spasms, Decreased activity tolerance, Pain, Hypomobility, Decreased mobility, Decreased strength  Visit Diagnosis: Chronic left shoulder pain  Chronic right shoulder pain     Problem List Patient Active Problem List   Diagnosis Date Noted   Fibroids 01/30/2021   Closed nondisplaced fracture of distal phalanx of right thumb 10/19/2020   Osteoporosis with last DEXA 02/2020 03/15/2020   Alopecia 08/21/2019   Hemorrhoid 08/21/2019   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   Skin inflammation 04/21/2015   VENOUS INSUFFICIENCY 08/18/2010   SPONDYLOSIS, LUMBAR 01/17/2009   Dyslipidemia 11/26/2007   Essential hypertension 11/25/2007    10:51 AM, 10/26/21 Jerene Pitch, DPT Physical Therapy with [facility]  [phone]  office   [place] [address] [place], [place], [address] Phone: [phone]   Fax:  [phone]  Name: Susan [name] MRN: [id number] Date of Birth: 03-24-1956

## 2021-11-01 ENCOUNTER — Other Ambulatory Visit: Payer: Self-pay

## 2021-11-01 ENCOUNTER — Encounter: Payer: Self-pay | Admitting: Physical Therapy

## 2021-11-01 ENCOUNTER — Ambulatory Visit (INDEPENDENT_AMBULATORY_CARE_PROVIDER_SITE_OTHER): Payer: Medicare Other | Admitting: Physical Therapy

## 2021-11-01 DIAGNOSIS — M25511 Pain in right shoulder: Secondary | ICD-10-CM | POA: Diagnosis not present

## 2021-11-01 DIAGNOSIS — G8929 Other chronic pain: Secondary | ICD-10-CM | POA: Diagnosis not present

## 2021-11-01 DIAGNOSIS — M25512 Pain in left shoulder: Secondary | ICD-10-CM | POA: Diagnosis not present

## 2021-11-01 NOTE — Therapy (Signed)
[facility] [address] [place], [place], [address] Phone: [phone]   Fax:  [phone]  Physical Therapy Treatment  Patient Details  Name: Susan [name] MRN: [id number] Date of Birth: [birth year] Referring Provider (PT): Inda Coke   Encounter Date: 11/01/2021   PT End of Session - 11/01/21 1000     Visit Number 6    Number of Visits 16    Date for PT Re-Evaluation 11/20/21    Authorization Type Medicare    PT Start Time 1005    PT Stop Time 2992    PT Time Calculation (min) 38 min    Activity Tolerance Patient tolerated treatment well    Behavior During Therapy WFL for tasks assessed/performed             Past Medical History:  Diagnosis Date   Allergy    Anemia    Anxiety state, unspecified    Back injury    HLD (hyperlipidemia)    intol of statins - follows with lipid clinic   HTN (hypertension)    Irritable bowel syndrome    Lumbosacral spondylosis without myelopathy    improved with PT   Morbid obesity ([place])     Past Surgical History:  Procedure Laterality Date   ABSCESS DRAINAGE     abdominal   CESAREAN SECTION  1977    There were no vitals filed for this visit.   Subjective Assessment - 11/01/21 1043     Subjective Pt states shoulders have been doing very well, minimal pain. Her back has been more painful this week.    Currently in Pain? No/denies                               [facility] Adult PT Treatment/Exercise - 11/01/21 0001       Shoulder Exercises: Supine   Flexion AAROM;15 reps    Flexion Limitations cane    Other Supine Exercises horiz abd RTb with scap squeeze x 10; Shoulder ER butterfly x 10;      Shoulder Exercises: Seated   Row --      Shoulder Exercises: Prone   Other Prone Exercises --    Other Prone Exercises quadruped mad horse/cow 3x5 with tactile and verbal cues-review  for back pain      Shoulder Exercises: Sidelying   External  Rotation --    External Rotation Weight (lbs) --      Shoulder Exercises: Standing   External Rotation 20 reps    Theraband Level (Shoulder External Rotation) Level 2 (Red)    Flexion AROM;15 reps    ABduction AROM;15 reps    Other Standing Exercises shoulder retraction  standing x 15;    Other Standing Exercises Wall push ups x 10 (slight pain) ;      Shoulder Exercises: Stretch   Other Shoulder Stretches doorway pec stretch x5 10" holds B; supine pec stretch 30 sec x 3;                     PT Education - 11/01/21 1043     Education Details updated and reviewed HEP/ final HEP    Person(s) Educated Patient    Methods Explanation;Demonstration;Tactile cues;Verbal cues;Handout    Comprehension Verbalized understanding;Returned demonstration;Verbal cues required;Tactile cues required;Need further instruction              PT Short Term Goals - 09/25/21 1303  PT SHORT TERM GOAL #1   Title Pt to be independent with initial HEP    Time 2    Period Weeks    Status New    Target Date 10/09/21               PT Long Term Goals - 09/25/21 1303       PT LONG TERM GOAL #1   Title Pt to be independent with final HEP    Time 8    Period Weeks    Status New    Target Date 11/20/21      PT LONG TERM GOAL #2   Title Pt to report decreased pain in bil shoulders to 0-2/10  with activity and ADLs.    Time 8    Period Weeks    Status New    Target Date 11/20/21      PT LONG TERM GOAL #3   Title Pt to demo ability for overhead reaching, lifting, and sleeping with shoulder pain no greater than 2/10.    Time 8    Period Weeks    Status New    Target Date 11/20/21      PT LONG TERM GOAL #4   Title Pt to demo improved strength of bil shoulders to at least 4+/5 to improve ability for reaching, lifting, carrying and IADLs.    Time 8    Period Weeks    Status New    Target Date 11/20/21                   Plan - 11/01/21 1046     Clinical  Impression Statement Pt with much improvement of shoulder pain. She is having minimal/no pain at this time. She requires mod/max verbal and tactile cuing to perform ther ex correctly. updated HEP and reviewed final HEP today. Pt to benefit from one more visit to review correct form for ther ex and HEP, likley d/c next visit.    Personal Factors and Comorbidities Time since onset of injury/illness/exacerbation    Examination-Activity Limitations Reach Overhead;Lift;Carry;Dressing    Examination-Participation Restrictions Cleaning;Meal Prep;Community Activity;Laundry    Stability/Clinical Decision Making Stable/Uncomplicated    Rehab Potential Good    PT Frequency 2x / week    PT Duration 8 weeks    PT Treatment/Interventions ADLs/Self Care Home Management;Cryotherapy;Electrical Stimulation;Iontophoresis 4mg /ml Dexamethasone;Moist Heat;Ultrasound;Functional mobility training;Therapeutic activities;Therapeutic exercise;Neuromuscular re-education;Manual techniques;Patient/family education;Passive range of motion;Dry needling;Taping;Vasopneumatic Device;Spinal Manipulations;Joint Manipulations    PT Home Exercise Plan [id number]    Consulted and Agree with Plan of Care Patient             Patient will benefit from skilled therapeutic intervention in order to improve the following deficits and impairments:  Decreased range of motion, Increased muscle spasms, Decreased activity tolerance, Pain, Hypomobility, Decreased mobility, Decreased strength  Visit Diagnosis: Chronic left shoulder pain  Chronic right shoulder pain     Problem List Patient Active Problem List   Diagnosis Date Noted   Fibroids 01/30/2021   Closed nondisplaced fracture of distal phalanx of right thumb 10/19/2020   Osteoporosis with last DEXA 02/2020 03/15/2020   Alopecia 08/21/2019   Hemorrhoid 08/21/2019   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   Skin inflammation 04/21/2015   VENOUS INSUFFICIENCY 08/18/2010   SPONDYLOSIS, LUMBAR  01/17/2009   Dyslipidemia 11/26/2007   Essential hypertension 11/25/2007    Lyndee Hensen, PT, DPT 10:48 AM  11/01/21    [facility] [address] [place], [place], [address] Phone:  [phone]   Fax:  [phone]  Name: Raeann Offner MRN: [id number] Date of Birth: 07/20/1956

## 2021-11-06 ENCOUNTER — Other Ambulatory Visit: Payer: Self-pay

## 2021-11-06 ENCOUNTER — Encounter: Payer: Medicare Other | Admitting: Family Medicine

## 2021-11-06 ENCOUNTER — Ambulatory Visit (INDEPENDENT_AMBULATORY_CARE_PROVIDER_SITE_OTHER): Payer: Medicare Other | Admitting: Physical Therapy

## 2021-11-06 ENCOUNTER — Encounter: Payer: Self-pay | Admitting: Physical Therapy

## 2021-11-06 DIAGNOSIS — M25511 Pain in right shoulder: Secondary | ICD-10-CM | POA: Diagnosis not present

## 2021-11-06 DIAGNOSIS — M25512 Pain in left shoulder: Secondary | ICD-10-CM | POA: Diagnosis not present

## 2021-11-06 DIAGNOSIS — G8929 Other chronic pain: Secondary | ICD-10-CM

## 2021-11-06 NOTE — Therapy (Signed)
[facility] [address] [place], [place], [address] Phone: [phone]   Fax:  [phone]  Physical Therapy Treatment/Discharge  Patient Details  Name: Susan [name] MRN: [id number] Date of Birth: Feb 05, 1956 Referring Provider (PT): Inda Coke   Encounter Date: 11/06/2021   PT End of Session - 11/06/21 1121     Visit Number 7    Number of Visits 16    Date for PT Re-Evaluation 11/20/21    Authorization Type Medicare    PT Start Time 1106    PT Stop Time 1135    PT Time Calculation (min) 29 min    Activity Tolerance Patient tolerated treatment well    Behavior During Therapy [facility] for tasks assessed/performed             Past Medical History:  Diagnosis Date   Allergy    Anemia    Anxiety state, unspecified    Back injury    HLD (hyperlipidemia)    intol of statins - follows with lipid clinic   HTN (hypertension)    Irritable bowel syndrome    Lumbosacral spondylosis without myelopathy    improved with PT   Morbid obesity ([place])     Past Surgical History:  Procedure Laterality Date   ABSCESS DRAINAGE     abdominal   CESAREAN SECTION  1977    There were no vitals filed for this visit.   Subjective Assessment - 11/06/21 1150     Subjective Pt states minimal/no pain in shoulders in last 2 weeks. Her back is still bothering her.    Currently in Pain? No/denies    Pain Score 0-No pain                               [facility] Adult PT Treatment/Exercise - 11/06/21 0001       Shoulder Exercises: Supine   Flexion AAROM;15 reps    Flexion Limitations cane    Other Supine Exercises horiz abd RTb with scap squeeze x 10; Shoulder ER butterfly x 10;    Other Supine Exercises AROM x 10 supine flexion      Shoulder Exercises: Prone   Other Prone Exercises --      Shoulder Exercises: Standing   External Rotation 20 reps    Theraband Level (Shoulder External Rotation) Level 2 (Red)     Flexion AROM;15 reps    ABduction AROM;15 reps    Other Standing Exercises shoulder retraction  standing x 15;    Other Standing Exercises Wall push ups x 10      Shoulder Exercises: Stretch   Other Shoulder Stretches --                     PT Education - 11/06/21 1151     Education Details reviewed final HEP    Person(s) Educated Patient    Methods Explanation;Demonstration;Tactile cues;Verbal cues;Handout    Comprehension Verbalized understanding;Returned demonstration;Verbal cues required;Tactile cues required;Need further instruction              PT Short Term Goals - 11/06/21 1152       PT SHORT TERM GOAL #1   Title Pt to be independent with initial HEP    Time 2    Period Weeks    Status Achieved    Target Date 10/09/21               PT Long  Term Goals - 11/06/21 1152       PT LONG TERM GOAL #1   Title Pt to be independent with final HEP    Time 8    Period Weeks    Status Achieved    Target Date 11/20/21      PT LONG TERM GOAL #2   Title Pt to report decreased pain in bil shoulders to 0-2/10  with activity and ADLs.    Time 8    Period Weeks    Status Achieved    Target Date 11/20/21      PT LONG TERM GOAL #3   Title Pt to demo ability for overhead reaching, lifting, and sleeping with shoulder pain no greater than 2/10.    Time 8    Period Weeks    Status Achieved    Target Date 11/20/21      PT LONG TERM GOAL #4   Title Pt to demo improved strength of bil shoulders to at least 4+/5 to improve ability for reaching, lifting, carrying and IADLs.    Time 8    Period Weeks    Status Achieved    Target Date 11/20/21                   Plan - 11/06/21 1151     Clinical Impression Statement Pt doing very well at this time, has met goals. She is able to do all regular activities without shoulder pain. Pt with improved ability for ROM, lifting, reaching, carrying and IADLs. Reviewed final HEP today, pt ready for d/c.  Recommended that she f/u with her regular back dr for ongoing back pain.    Personal Factors and Comorbidities Time since onset of injury/illness/exacerbation    Examination-Activity Limitations Reach Overhead;Lift;Carry;Dressing    Examination-Participation Restrictions Cleaning;Meal Prep;Community Activity;Laundry    Stability/Clinical Decision Making Stable/Uncomplicated    Rehab Potential Good    PT Frequency 2x / week    PT Duration 8 weeks    PT Treatment/Interventions ADLs/Self Care Home Management;Cryotherapy;Electrical Stimulation;Iontophoresis 74m/ml Dexamethasone;Moist Heat;Ultrasound;Functional mobility training;Therapeutic activities;Therapeutic exercise;Neuromuscular re-education;Manual techniques;Patient/family education;Passive range of motion;Dry needling;Taping;Vasopneumatic Device;Spinal Manipulations;Joint Manipulations    PT Home Exercise Plan [id number]    Consulted and Agree with Plan of Care Patient             Patient will benefit from skilled therapeutic intervention in order to improve the following deficits and impairments:  Decreased range of motion, Increased muscle spasms, Decreased activity tolerance, Pain, Hypomobility, Decreased mobility, Decreased strength  Visit Diagnosis: Chronic left shoulder pain  Chronic right shoulder pain     Problem List Patient Active Problem List   Diagnosis Date Noted   Fibroids 01/30/2021   Closed nondisplaced fracture of distal phalanx of right thumb 10/19/2020   Osteoporosis with last DEXA 02/2020 03/15/2020   Alopecia 08/21/2019   Hemorrhoid 08/21/2019   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  Patient goals were met. Patient is being discharged due to meeting the stated rehab goals.     LAnder Purpura  Kayleen Memos, PT, DPT 11:53 AM  11/06/21

## 2021-11-09 ENCOUNTER — Encounter: Payer: Medicare Other | Admitting: Family Medicine

## 2021-11-15 ENCOUNTER — Ambulatory Visit (INDEPENDENT_AMBULATORY_CARE_PROVIDER_SITE_OTHER): Payer: Medicare Other | Admitting: Family Medicine

## 2021-11-15 ENCOUNTER — Encounter: Payer: Self-pay | Admitting: Family Medicine

## 2021-11-15 ENCOUNTER — Other Ambulatory Visit: Payer: Self-pay

## 2021-11-15 VITALS — BP 157/80 | HR 60 | Ht 59.5 in | Wt 235.2 lb

## 2021-11-15 DIAGNOSIS — Z23 Encounter for immunization: Secondary | ICD-10-CM | POA: Diagnosis not present

## 2021-11-15 DIAGNOSIS — I10 Essential (primary) hypertension: Secondary | ICD-10-CM

## 2021-11-15 DIAGNOSIS — M810 Age-related osteoporosis without current pathological fracture: Secondary | ICD-10-CM

## 2021-11-15 DIAGNOSIS — R739 Hyperglycemia, unspecified: Secondary | ICD-10-CM | POA: Diagnosis not present

## 2021-11-15 DIAGNOSIS — Z0001 Encounter for general adult medical examination with abnormal findings: Secondary | ICD-10-CM

## 2021-11-15 DIAGNOSIS — E785 Hyperlipidemia, unspecified: Secondary | ICD-10-CM

## 2021-11-15 LAB — LIPID PANEL
Cholesterol: 215 mg/dL — ABNORMAL HIGH (ref 0–200)
HDL: 79.8 mg/dL (ref >39.00)
LDL Cholesterol: 122 mg/dL — ABNORMAL HIGH (ref 0–99)
NonHDL: 135.08
Total CHOL/HDL Ratio: 3
Triglycerides: 67 mg/dL (ref 0.0–149.0)
VLDL: 13.4 mg/dL (ref 0.0–40.0)

## 2021-11-15 LAB — COMPREHENSIVE METABOLIC PANEL
ALT: 10 U/L (ref 0–35)
AST: 16 U/L (ref 0–37)
Albumin: 4.3 g/dL (ref 3.5–5.2)
Alkaline Phosphatase: 90 U/L (ref 39–117)
BUN: 12 mg/dL (ref 6–23)
CO2: 29 mEq/L (ref 19–32)
Calcium: 9.6 mg/dL (ref 8.4–10.5)
Chloride: 103 mEq/L (ref 96–112)
Creatinine, Ser: 0.67 mg/dL (ref 0.40–1.20)
GFR: 90.72 mL/min (ref >60.00)
Glucose, Bld: 85 mg/dL (ref 70–99)
Potassium: 4.7 mEq/L (ref 3.5–5.1)
Sodium: 139 mEq/L (ref 135–145)
Total Bilirubin: 0.4 mg/dL (ref 0.2–1.2)
Total Protein: 7.3 g/dL (ref 6.0–8.3)

## 2021-11-15 LAB — CBC
HCT: 39.7 % (ref 36.0–46.0)
Hemoglobin: 12.9 g/dL (ref 12.0–15.0)
MCHC: 32.5 g/dL (ref 30.0–36.0)
MCV: 91.5 fl (ref 78.0–100.0)
Platelets: 351 10*3/uL (ref 150.0–400.0)
RBC: 4.34 Mil/uL (ref 3.87–5.11)
RDW: 14.2 % (ref 11.5–15.5)
WBC: 7.2 10*3/uL (ref 4.0–10.5)

## 2021-11-15 LAB — HEMOGLOBIN A1C: Hgb A1c MFr Bld: 5.9 % (ref 4.6–6.5)

## 2021-11-15 LAB — VITAMIN D 25 HYDROXY (VIT D DEFICIENCY, FRACTURES): VITD: 27.11 ng/mL — ABNORMAL LOW (ref 30.00–100.00)

## 2021-11-15 LAB — TSH: TSH: 0.96 u[IU]/mL (ref 0.35–5.50)

## 2021-11-15 LAB — MAGNESIUM: Magnesium: 1.9 mg/dL (ref 1.5–2.5)

## 2021-11-15 MED ORDER — OZEMPIC (0.25 OR 0.5 MG/DOSE) 2 MG/1.5ML ~~LOC~~ SOPN: 0.5000 mg | PEN_INJECTOR | SUBCUTANEOUS | 0 refills | Status: DC

## 2021-11-15 NOTE — Progress Notes (Signed)
Chief Complaint:  Susan [name] is a 66 y.o. female who presents today for her annual comprehensive physical exam.    Assessment/Plan:  Chronic Problems Addressed Today: Essential hypertension Since her last visit she was switched from amlodipine and HCTZ to spironolactone 25 mg daily.  She has tolerated this transition well.  Her home blood pressure readings are typically in the 130s over 70s to 80s.  Her blood pressure is a little bit elevated today however this is atypical for her.  We will continue current regimen spironolactone 25 mg daily and she will continue to monitor at home.  She will let me know if persistently elevated and we can increase the dose as needed.  Dyslipidemia On Zetia 10 mg daily.  We will check lipids today.  Hyperglycemia Last A1c 6.0.  We will be starting Ozempic 0.25 mg weekly for 4 weeks and then increase to 0.5 mg weekly..  Discussed potential side effects.  She will follow-up with me in a few weeks via MyChart.  Morbid obesity ([place]) We will be starting Ozempic for hypoglycemia which should also help some with weight loss.  We discussed potential side effects.  We will start 0.25 mg weekly for 4 weeks and then increase to 0.5 mg weekly.  She will follow-up with me in a few weeks via MyChart.  Preventative Healthcare: Check labs.  Up-to-date on cancer screening.  We will give pneumonia vaccine today.  Patient Counseling(The following topics were reviewed and/or handout was given):  -Nutrition: Stressed importance of moderation in sodium/caffeine intake, saturated fat and cholesterol, caloric balance, sufficient intake of fresh fruits, vegetables, and fiber.  -Stressed the importance of regular exercise.   -Substance Abuse: Discussed cessation/primary prevention of tobacco, alcohol, or other drug use; driving or other dangerous activities under the influence; availability of treatment for abuse.   -Injury prevention: Discussed safety belts, safety  helmets, smoke detector, smoking near bedding or upholstery.   -Sexuality: Discussed sexually transmitted diseases, partner selection, use of condoms, avoidance of unintended pregnancy and contraceptive alternatives.   -Dental health: Discussed importance of regular tooth brushing, flossing, and dental visits.  -Health maintenance and immunizations reviewed. Please refer to Health maintenance section.  Return to care in 1 year for next preventative visit.     Subjective:  HPI:  She has no acute complaints today.   Lifestyle Diet: Cutting down on sugars.  Exercise: Limited.   Depression screen PHQ 2/9 07/12/2021  Decreased Interest 0  Down, Depressed, Hopeless 0  PHQ - 2 Score 0    Health Maintenance Due  Topic Date Due   Pneumonia Vaccine 72+ Years old (1 - PCV) Never done     ROS: Per HPI, otherwise a complete review of systems was negative.   PMH:  The following were reviewed and entered/updated in epic: Past Medical History:  Diagnosis Date   Allergy    Anemia    Anxiety state, unspecified    Back injury    HLD (hyperlipidemia)    intol of statins - follows with lipid clinic   HTN (hypertension)    Irritable bowel syndrome    Lumbosacral spondylosis without myelopathy    improved with PT   Morbid obesity [facility])    Patient Active Problem List   Diagnosis Date Noted   Hyperglycemia 11/15/2021   Fibroids 01/30/2021   Closed nondisplaced fracture of distal phalanx of right thumb 10/19/2020   Osteoporosis with last DEXA 02/2020 03/15/2020   Alopecia 08/21/2019   Hemorrhoid 08/21/2019  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   Skin inflammation 04/21/2015   VENOUS INSUFFICIENCY 08/18/2010   SPONDYLOSIS, LUMBAR 01/17/2009   Dyslipidemia 11/26/2007   Morbid obesity ([place]) 11/26/2007   Essential hypertension 11/25/2007   Past Surgical History:  Procedure Laterality Date   ABSCESS DRAINAGE     abdominal   CESAREAN SECTION  1977    Family History  Problem Relation Age of  Onset   Breast cancer Mother    Diabetes Mother    Hypertension Mother    Multiple myeloma Mother    Diabetes Father        on dialysis   Kidney failure Father    Colon cancer Maternal Grandmother     Medications- reviewed and updated Current Outpatient Medications  Medication Sig Dispense Refill   calcium citrate (CALCITRATE - DOSED IN MG ELEMENTAL CALCIUM) 950 (200 Ca) MG tablet Take 200 mg of elemental calcium by mouth daily.     cholecalciferol (VITAMIN D3) 25 MCG (1000 UNIT) tablet Take 1,000 Units by mouth daily.     diclofenac Sodium (VOLTAREN) 1 % GEL Apply 4 g to affected area up to 4 times daily.     docusate sodium (COLACE) 100 MG capsule Take 100 mg by mouth daily.     ezetimibe (ZETIA) 10 MG tablet Take 1 tablet (10 mg total) by mouth daily. 90 tablet 3   orphenadrine (NORFLEX) 100 MG tablet Take 100 mg by mouth as needed for muscle spasms.      Semaglutide,0.25 or 0.5MG/DOS, (OZEMPIC, 0.25 OR 0.5 MG/DOSE,) 2 MG/1.5ML SOPN Inject 0.5 mg into the skin once a week. 4.5 mL 0   spironolactone (ALDACTONE) 25 MG tablet Take 1 tablet (25 mg total) by mouth daily. 90 tablet 2   topiramate (TOPAMAX) 25 MG tablet Take 1 tablet by mouth every evening.     albuterol (PROAIR HFA) 108 (90 Base) MCG/ACT inhaler Inhale 2 puffs into the lungs every 6 (six) hours as needed for wheezing or shortness of breath. (Patient not taking: Reported on 11/15/2021) 1 each 0   finasteride (PROSCAR) 5 MG tablet Take by mouth. Takes half a pill daily (Patient not taking: Reported on 11/15/2021)     Fluocinolone Acetonide Body 0.01 % OIL Apply to scalp 3 times per week (Patient not taking: Reported on 11/15/2021) 120 mL 1   No current facility-administered medications for this visit.    Allergies-reviewed and updated Allergies  Allergen Reactions   Aspirin     Other reaction(s): Bleeding   Avelox [Moxifloxacin Hcl In Nacl] Anaphylaxis    Pt c/o swelling of her throat and tongue   Shellfish Allergy  Anaphylaxis and Hives    Rash and itching Rash and itching   Bee Venom Hives   Cat Hair Extract Other (See Comments)   Ibuprofen Other (See Comments)    Gastric bleeding Gastric bleeding   Codeine Other (See Comments)    REACTION: causes her heart to race   Crestor [Rosuvastatin Calcium]     Pt states INTOLERANT w/ muscle cramps   Penicillins Hives    REACTION: rash   Pravastatin Sodium [Pravachol]     REACTION: intolerant= headache & insomnia   Simvastatin     REACTION: intolerant= headache & insomnia    Social History   Socioeconomic History   Marital status: Divorced    Spouse name: Not on file   Number of children: 1   Years of education: Not on file   Highest education level: Not on file  Occupational History  Occupation: Retired    Comment: Restaurant manager, 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   Tobacco Use   Smoking status: Never   Smokeless tobacco: Never  Vaping Use   Vaping Use: Never used  Substance and Sexual Activity   Alcohol use: No   Drug use: No   Sexual activity: Not Currently  Other Topics Concern   Not on file  Social History Narrative   Lives alone, divorced since 48   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, clinical (histocompatibility and immunogenetics)   Social Determinants of Radio broadcast assistant Strain: Low Risk    Difficulty of Paying Living Expenses: Not hard at all  Food Insecurity: No Food Insecurity   Worried About Charity fundraiser in the Last Year: Never true   Arboriculturist in the Last Year: Never true  Transportation Needs: No Transportation Needs   Lack of Transportation (Medical): No   Lack of Transportation (Non-Medical): No  Physical Activity: Insufficiently Active   Days of Exercise per Week: 4 days   Minutes of Exercise per Session: 30 min  Stress: Not on file  Social Connections: Socially Isolated   Frequency of Communication with Friends and Family: More than three times a week   Frequency of Social Gatherings with Friends and Family: Once  a week   Attends Religious Services: Never   Marine scientist or Organizations: No   Attends Music therapist: Not on file   Marital Status: Divorced        Objective:  Physical Exam: BP (!) 157/80 (BP Location: Left Arm, Patient Position: Sitting)    Pulse 60    Ht 4' 11.5" (1.511 m)    Wt 235 lb 3.2 oz (106.7 kg)    SpO2 100%    BMI 46.71 kg/m   Body mass index is 46.71 kg/m. Wt Readings from Last 3 Encounters:  11/15/21 235 lb 3.2 oz (106.7 kg)  08/29/21 237 lb 4 oz (107.6 kg)  07/03/21 233 lb (105.7 kg)   Gen: NAD, resting comfortably HEENT: TMs normal bilaterally. OP clear. No thyromegaly noted.  CV: RRR with no murmurs appreciated Pulm: NWOB, CTAB with no crackles, wheezes, or rhonchi GI: Normal bowel sounds present. Soft, Nontender, Nondistended. MSK: no edema, cyanosis, or clubbing noted Skin: warm, dry Neuro: CN2-12 grossly intact. Strength 5/5 in upper and lower extremities. Reflexes symmetric and intact bilaterally.  Psych: Normal affect and thought content     Tracye Szuch M. Jerline Pain, MD 11/15/2021 9:51 AM

## 2021-11-15 NOTE — Assessment & Plan Note (Signed)
Last A1c 6.0.  We will be starting Ozempic 0.25 mg weekly for 4 weeks and then increase to 0.5 mg weekly..  Discussed potential side effects.  She will follow-up with me in a few weeks via MyChart.

## 2021-11-15 NOTE — Addendum Note (Signed)
Addended by: Sheilah Pigeon A on: 11/15/2021 10:55 AM   Modules accepted: Orders

## 2021-11-15 NOTE — Assessment & Plan Note (Signed)
Since her last visit she was switched from amlodipine and HCTZ to spironolactone 25 mg daily.  She has tolerated this transition well.  Her home blood pressure readings are typically in the 130s over 70s to 80s.  Her blood pressure is a little bit elevated today however this is atypical for her.  We will continue current regimen spironolactone 25 mg daily and she will continue to monitor at home.  She will let me know if persistently elevated and we can increase the dose as needed.

## 2021-11-15 NOTE — Assessment & Plan Note (Signed)
On Zetia 10 mg daily.  We will check lipids today.

## 2021-11-15 NOTE — Patient Instructions (Signed)
It was very nice to see you today!  We will start Ozempic.  This will help lower your blood sugar and also help you with weight loss.  Please take 0.25 mg weekly for 4 weeks and then increase to 0.5 mg weekly.  Sending message in a few weeks to let me know how you are doing before we increase to the higher dose.  Please let me know if you need me to send this to another [facility].  Keep an eye on your blood pressure and let me know if it is persistently 140/90 or higher.  We will check blood work today.  We will give your pneumonia vaccine.  I will see back in year for your next physical.  Come back to see me sooner if needed.  Take care, Dr Jerline Pain  PLEASE NOTE:  If you had any lab tests please let us know if you have not heard back within a few days. You may see your results on mychart before we have a chance to review them but we will give you a call once they are reviewed by [place]. If we ordered any referrals today, please let us know if you have not heard from their office within the next week.   Please try these tips to maintain a healthy lifestyle:  Eat at least 3 REAL meals and 1-2 snacks per day.  Aim for no more than 5 hours between eating.  If you eat breakfast, please do so within one hour of getting up.   Each meal should contain half fruits/vegetables, one quarter protein, and one quarter carbs (no bigger than a computer mouse)  Cut down on sweet beverages. This includes juice, soda, and sweet tea.   Drink at least 1 glass of water with each meal and aim for at least 8 glasses per day  Exercise at least 150 minutes every week.    Preventive Care 62 Years and Older, Female Preventive care refers to lifestyle choices and visits with your health care provider that can promote health and wellness. Preventive care visits are also called wellness exams. What can I expect for my preventive care visit? Counseling Your health care provider may ask you  questions about your: Medical history, including: Past medical problems. Family medical history. Pregnancy and menstrual history. History of falls. Current health, including: Memory and ability to understand (cognition). Emotional well-being. Home life and relationship well-being. Sexual activity and sexual health. Lifestyle, including: Alcohol, nicotine or tobacco, and drug use. Access to firearms. Diet, exercise, and sleep habits. Work and work Statistician. Sunscreen use. Safety issues such as seatbelt and bike helmet use. Physical exam Your health care provider will check your: Height and weight. These may be used to calculate your BMI (body mass index). BMI is a measurement that tells if you are at a healthy weight. Waist circumference. This measures the distance around your waistline. This measurement also tells if you are at a healthy weight and may help predict your risk of certain diseases, such as type 2 diabetes and high blood pressure. Heart rate and blood pressure. Body temperature. Skin for abnormal spots. What immunizations do I need? Vaccines are usually given at various ages, according to a schedule. Your health care provider will recommend vaccines for you based on your age, medical history, and lifestyle or other factors, such as travel or where you work. What tests do I need? Screening Your health care provider may recommend screening tests for certain conditions. This may  include: Lipid and cholesterol levels. Hepatitis C test. Hepatitis B test. HIV (human immunodeficiency virus) test. STI (sexually transmitted infection) testing, if you are at risk. Lung cancer screening. Colorectal cancer screening. Diabetes screening. This is done by checking your blood sugar (glucose) after you have not eaten for a while (fasting). Mammogram. Talk with your health care provider about how often you should have regular mammograms. BRCA-related cancer screening. This may be  done if you have a family history of breast, ovarian, tubal, or peritoneal cancers. Bone density scan. This is done to screen for osteoporosis. Talk with your health care provider about your test results, treatment options, and if necessary, the need for more tests. Follow these instructions at home: Eating and drinking  Eat a diet that includes fresh fruits and vegetables, whole grains, lean protein, and low-fat dairy products. Limit your intake of foods with high amounts of sugar, saturated fats, and salt. Take vitamin and mineral supplements as recommended by your health care provider. Do not drink alcohol if your health care provider tells you not to drink. If you drink alcohol: Limit how much you have to 0-1 drink a day. Know how much alcohol is in your drink. In the [place] one drink equals one 12 oz bottle of beer (355 mL), one 5 oz glass of wine (148 mL), or one 1 oz glass of hard liquor (44 mL). Lifestyle Brush your teeth every morning and night with fluoride toothpaste. Floss one time each day. Exercise for at least 30 minutes 5 or more days each week. Do not use any products that contain nicotine or tobacco. These products include cigarettes, chewing tobacco, and vaping devices, such as e-cigarettes. If you need help quitting, ask your health care provider. Do not use drugs. If you are sexually active, practice safe sex. Use a condom or other form of protection in order to prevent STIs. Take aspirin only as told by your health care provider. Make sure that you understand how much to take and what form to take. Work with your health care provider to find out whether it is safe and beneficial for you to take aspirin daily. Ask your health care provider if you need to take a cholesterol-lowering medicine (statin). Find healthy ways to manage stress, such as: Meditation, yoga, or listening to music. Journaling. Talking to a trusted person. Spending time with friends and  family. Minimize exposure to UV radiation to reduce your risk of skin cancer. Safety Always wear your seat belt while driving or riding in a vehicle. Do not drive: If you have been drinking alcohol. Do not ride with someone who has been drinking. When you are tired or distracted. While texting. If you have been using any mind-altering substances or drugs. Wear a helmet and other protective equipment during sports activities. If you have firearms in your house, make sure you follow all gun safety procedures. What's next? Visit your health care provider once a year for an annual wellness visit. Ask your health care provider how often you should have your eyes and teeth checked. Stay up to date on all vaccines. This information is not intended to replace advice given to you by your health care provider. Make sure you discuss any questions you have with your health care provider. Document Revised: 04/05/2021 Document Reviewed: 04/05/2021 Elsevier Patient Education  [place].

## 2021-11-15 NOTE — Assessment & Plan Note (Signed)
We will be starting Ozempic for hypoglycemia which should also help some with weight loss.  We discussed potential side effects.  We will start 0.25 mg weekly for 4 weeks and then increase to 0.5 mg weekly.  She will follow-up with me in a few weeks via MyChart.

## 2021-11-17 ENCOUNTER — Telehealth: Payer: Self-pay | Admitting: Family Medicine

## 2021-11-17 MED ORDER — OZEMPIC (0.25 OR 0.5 MG/DOSE) 2 MG/1.5ML ~~LOC~~ SOPN: 0.5000 mg | PEN_INJECTOR | SUBCUTANEOUS | 0 refills | Status: DC

## 2021-11-17 NOTE — Progress Notes (Signed)
Please inform patient of the following:  Vitamin D is low.  Recommend she take 1000 2000 IUs daily.  We can recheck in 3 to 6 months.  Her cholesterol and blood sugar are both borderline elevated but stable.  We will be starting Ozempic to help with the sugar and weight loss.  Do not need to make any other changes to her treatment plan at this time.  We can recheck A1c in about 6 months.  Recheck cholesterol levels about a year.  All of her other labs are stable.

## 2021-11-17 NOTE — Telephone Encounter (Signed)
Pt called stating that [facility] does not have Ozempic, and that it is on back order. Pt wants Dr. Jerline Pain to send the RX to [facility] in order to received the Ozempic 3 month supply for $40.00. Pt also wants Dr. Marigene Ehlers advice on when to take the medication.

## 2021-11-17 NOTE — Telephone Encounter (Signed)
RX sent to mail order pharmacy.  LM notifying pt and informed her that she can take the medication any day of the week morning or night as long as she takes it on the same day

## 2021-11-23 ENCOUNTER — Telehealth: Payer: Self-pay | Admitting: Family Medicine

## 2021-11-23 NOTE — Telephone Encounter (Signed)
Pt stated a MS[employer] called yesterday at 2:57pm and left a message saying to please call our office. There is no record in epic stating a message. Pt was informed. - Susan [name]

## 2021-11-29 ENCOUNTER — Telehealth: Payer: Self-pay | Admitting: Family Medicine

## 2021-11-29 ENCOUNTER — Other Ambulatory Visit: Payer: Self-pay | Admitting: *Deleted

## 2021-11-29 MED ORDER — OZEMPIC (0.25 OR 0.5 MG/DOSE) 2 MG/1.5ML ~~LOC~~ SOPN: 0.5000 mg | PEN_INJECTOR | SUBCUTANEOUS | 0 refills | Status: DC

## 2021-11-29 NOTE — Telephone Encounter (Signed)
Rx Ozempic send to [facility]

## 2021-11-29 NOTE — Telephone Encounter (Signed)
Pt [facility] and wasn't sure if it was sent in. I gave her the number to call them. It was confirmed as received. She is concerned what she should do if she runs out before she can get the next refill.   E-Prescribing Status: Receipt confirmed by pharmacy (11/17/2021  3:52 PM EST) .Marland Kitchen  MEDICATION: Semaglutide,0.25 or 0.5MG /DOS, (OZEMPIC, 0.25 OR 0.5 MG/DOSE,) 2 MG/1.5ML SOPN  Is the patient out of medication? 2 doses (2 weeks) left  [facility] [place] [place] [facility]   Let patient know to contact pharmacy at the end of the day to make sure medication is ready.  Please notify patient to allow 48-72 hours to process

## 2021-12-04 ENCOUNTER — Telehealth: Payer: Self-pay | Admitting: Family Medicine

## 2021-12-04 NOTE — Telephone Encounter (Signed)
Pt states Dr Jerline Pain wanted to hear how she was after her first couple of doses of her medication. She states she is doing well with it. She also received her other doses from her pharmacy. She would like to talk to [place]. She had a question about the future higher doses.  MEDICATION: Semaglutide,0.25 or 0.5MG /DOS, (OZEMPIC, 0.25 OR 0.5 MG/DOSE,) 2 MG/1.5ML SOPN

## 2021-12-05 NOTE — Telephone Encounter (Signed)
Left message to return call to our office at their convenience.  

## 2021-12-29 ENCOUNTER — Other Ambulatory Visit: Payer: Self-pay | Admitting: Family Medicine

## 2022-01-01 ENCOUNTER — Telehealth: Payer: Self-pay | Admitting: Family Medicine

## 2022-01-01 NOTE — Telephone Encounter (Signed)
Pt states she is experiencing constipation which she believes is a side effect of the Ozempic. She would like a suggestion from Dr Jerline Pain on what she should take. Please advise ?

## 2022-01-01 NOTE — Telephone Encounter (Signed)
Pt states she had a small bowel movement on Sunday but not going like she should. She is taking the Colace daily, knows that you are  out of the office till Tuesday. States she is fine until then, please advise  ?

## 2022-01-02 NOTE — Telephone Encounter (Signed)
Patient informed of the message below.

## 2022-01-02 NOTE — Telephone Encounter (Signed)
Recommend starting with 1 dose of miralax daily as needed. She can go up to 2 or 3 doses daily if needed. ? ?Susan [name]. Susan Pain, MD ?01/02/2022 8:02 AM  ? ?

## 2022-01-11 IMAGING — MG MM DIGITAL SCREENING BILAT W/ TOMO AND CAD
8 series · 8 of 24 positions shown · non-contrast
Comparison: Previous exam(s).

ACR Breast Density Category a: The breast tissue is almost entirely
fatty.

CLINICAL DATA: Screening.

EXAM:
DIGITAL SCREENING BILATERAL MAMMOGRAM WITH TOMOSYNTHESIS AND CAD
TECHNIQUE: Bilateral screening digital craniocaudal and mediolateral oblique
mammograms were obtained. Bilateral screening digital breast
tomosynthesis was performed. The images were evaluated with
computer-aided detection.

[R MLO synth-2D]
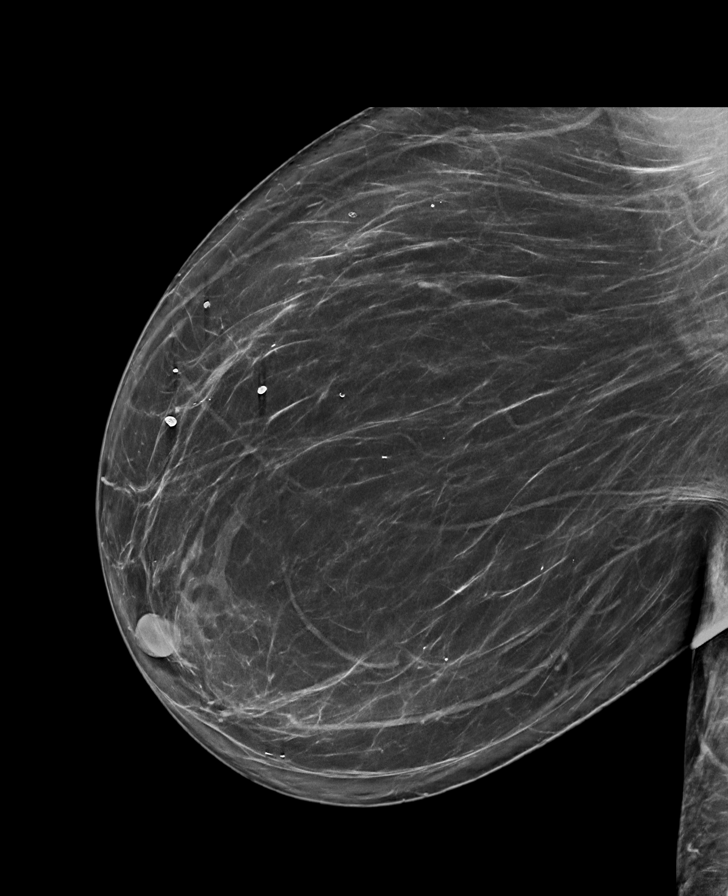

[L CC synth-2D]
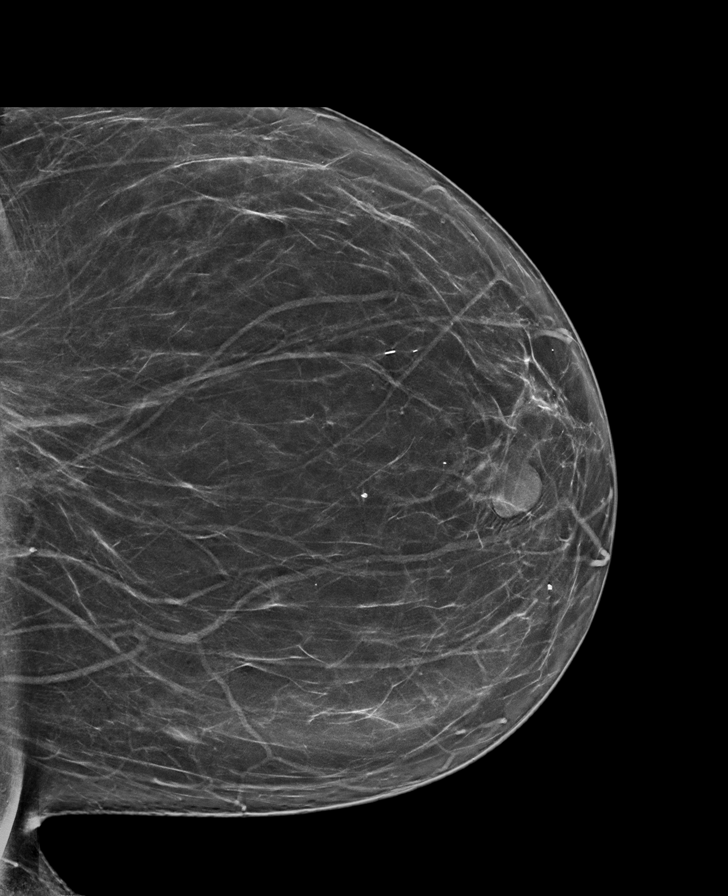

[R CC synth-2D]
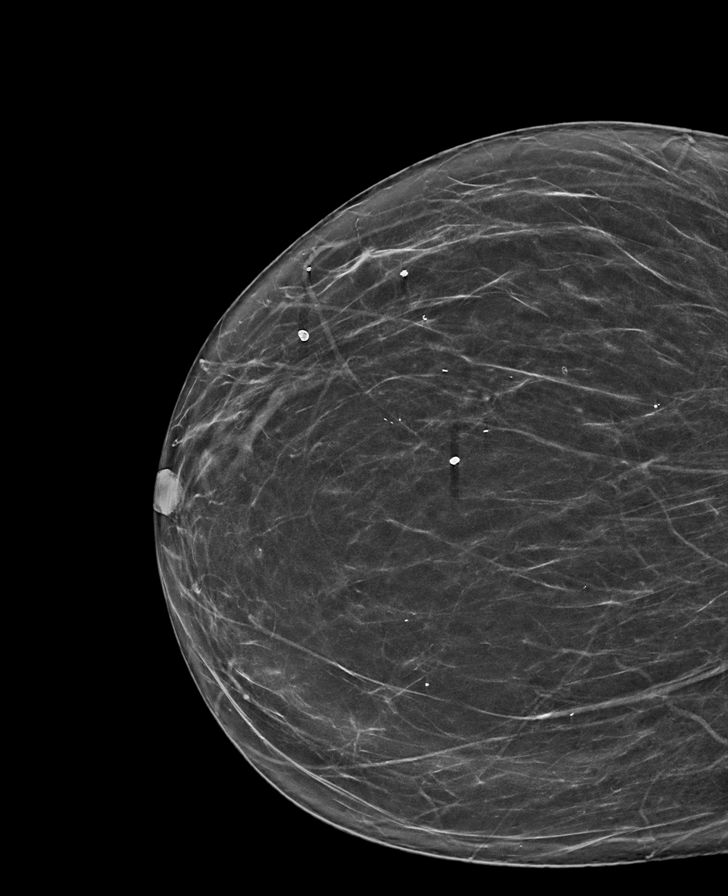

[L MLO synth-2D]
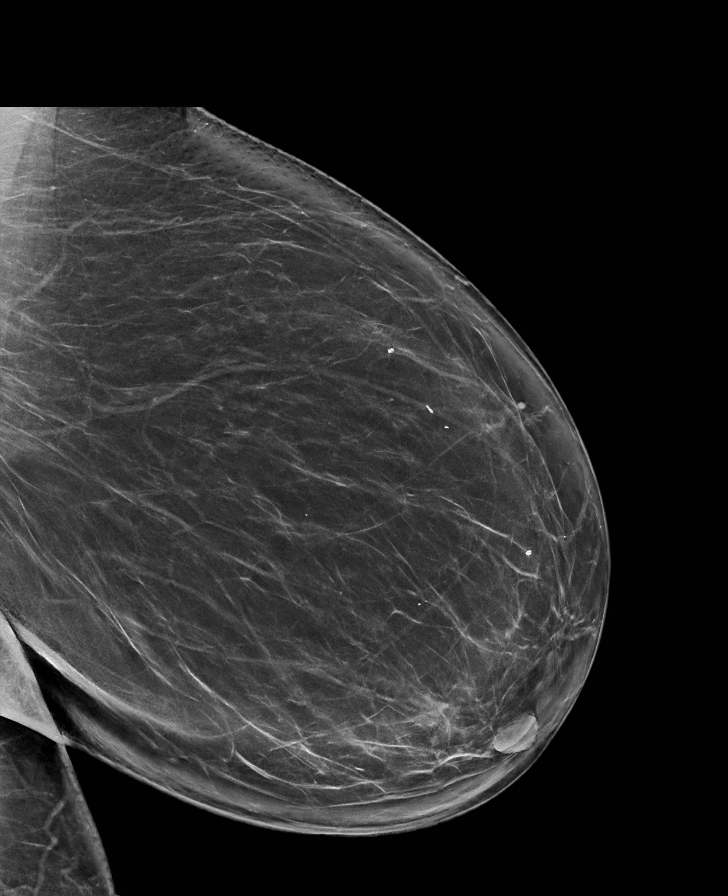

[R MLO tomo · tomo slice 43/85.0]
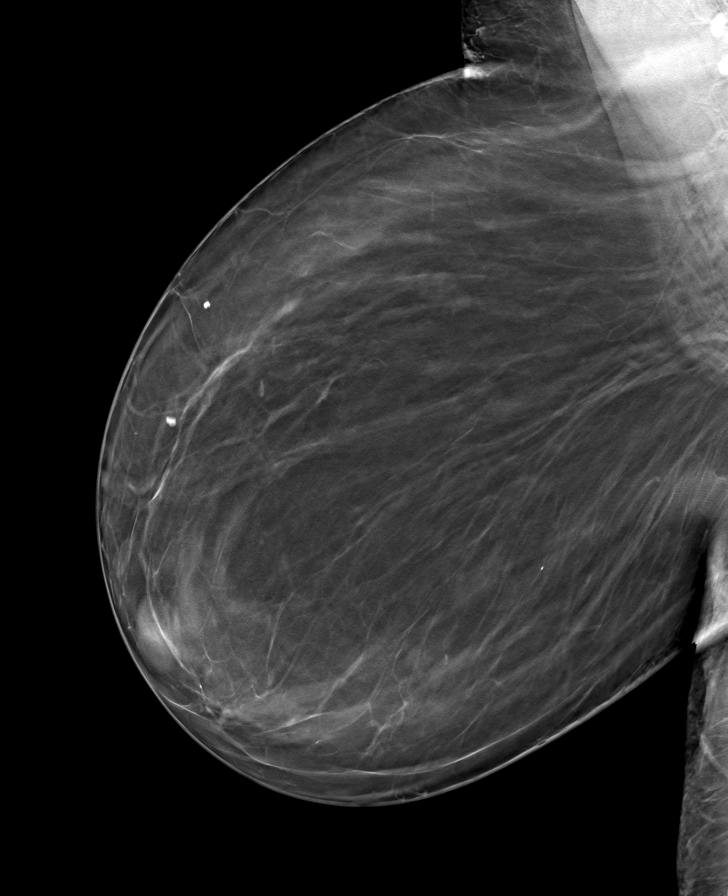

[L CC tomo · tomo slice 39/78.0]
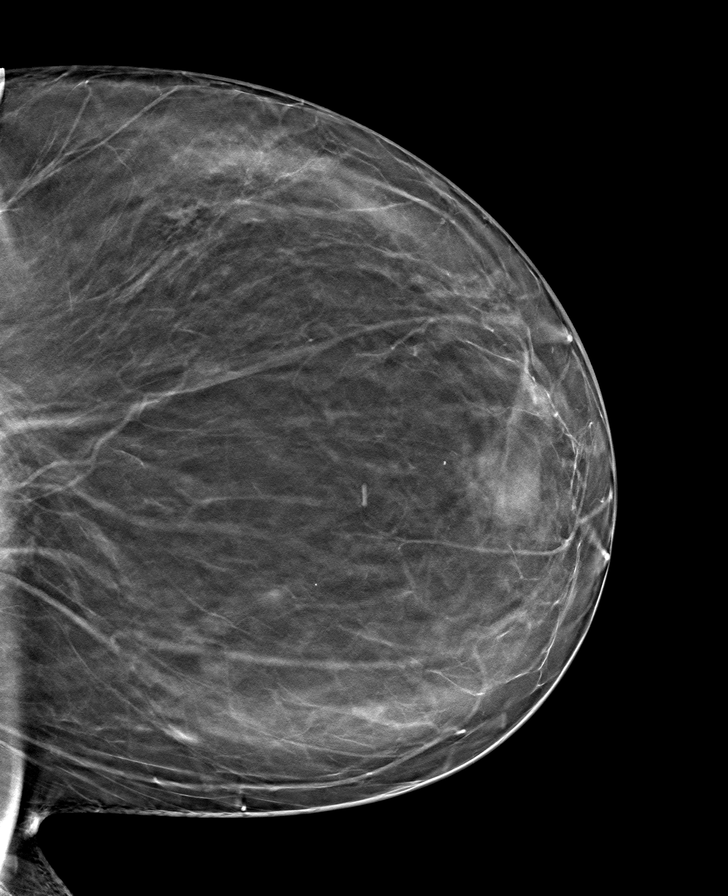

[L MLO tomo · tomo slice 45/90.0]
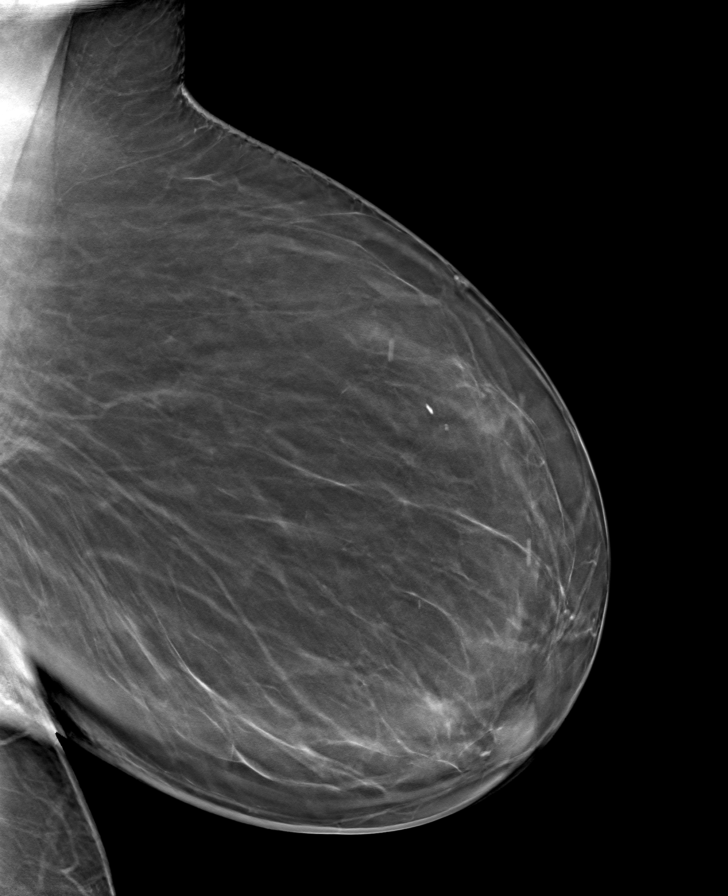

[R CC tomo · tomo slice 35/70.0]
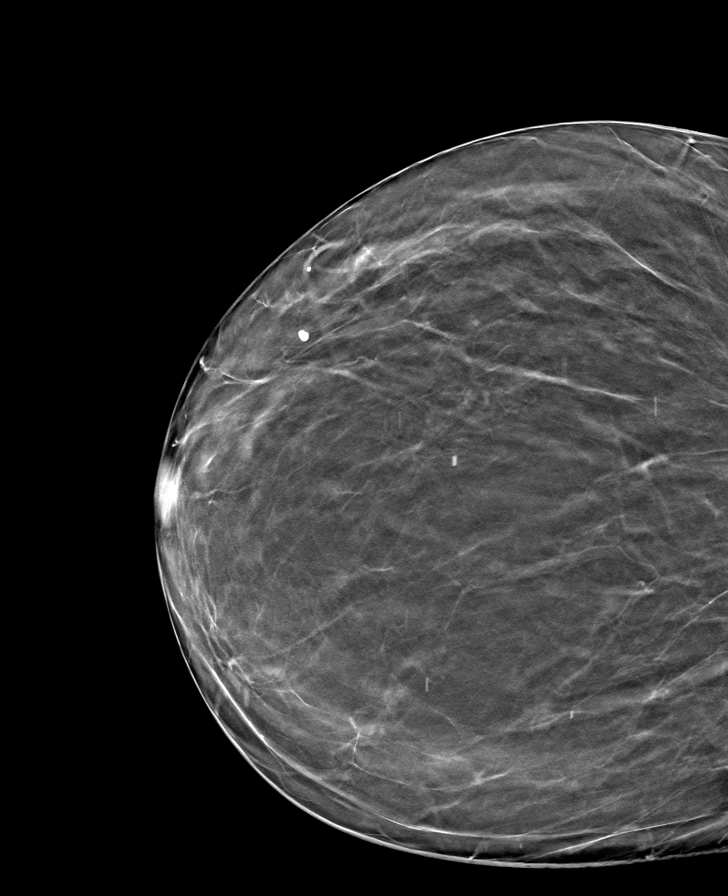

[8 of 24 positions shown; findings below may reference images not displayed]

FINDINGS: There are no findings suspicious for malignancy.
IMPRESSION: No mammographic evidence of malignancy. A result letter of this
screening mammogram will be mailed directly to the patient.

RECOMMENDATION:
Screening mammogram in one year. (Code:0E-[id number])

BI-RADS CATEGORY  1: Negative.

## 2022-02-06 ENCOUNTER — Telehealth: Payer: Self-pay

## 2022-02-06 ENCOUNTER — Other Ambulatory Visit: Payer: Self-pay | Admitting: *Deleted

## 2022-02-06 NOTE — Telephone Encounter (Signed)
Would like to know if Dr. Jerline Pain could send in a higher dose of ozempic?  States she has been on 0.25 since March 1st.    [facility]

## 2022-02-06 NOTE — Telephone Encounter (Signed)
She should increase to 0.'5mg'$  weekly and follow up with [place] in 4-6 weeks. ? ?Algis Greenhouse. Jerline Pain, MD ?02/06/2022 12:17 PM  ? ?

## 2022-02-06 NOTE — Telephone Encounter (Signed)
Please advise 

## 2022-02-06 NOTE — Telephone Encounter (Signed)
Patient requesting call back at [phone].

## 2022-02-06 NOTE — Telephone Encounter (Signed)
Left message to return call to our office at their convenience.  

## 2022-02-06 NOTE — Telephone Encounter (Signed)
Patient aware can take Ozempic 0.'5mg'$  do not need refills at this time  ?

## 2022-02-23 ENCOUNTER — Ambulatory Visit (INDEPENDENT_AMBULATORY_CARE_PROVIDER_SITE_OTHER): Payer: Medicare Other | Admitting: Family Medicine

## 2022-02-23 ENCOUNTER — Encounter: Payer: Self-pay | Admitting: Family Medicine

## 2022-02-23 DIAGNOSIS — J309 Allergic rhinitis, unspecified: Secondary | ICD-10-CM

## 2022-02-23 DIAGNOSIS — K5900 Constipation, unspecified: Secondary | ICD-10-CM | POA: Diagnosis not present

## 2022-02-23 DIAGNOSIS — Z91038 Other insect allergy status: Secondary | ICD-10-CM | POA: Insufficient documentation

## 2022-02-23 DIAGNOSIS — Z91030 Bee allergy status: Secondary | ICD-10-CM | POA: Diagnosis not present

## 2022-02-23 DIAGNOSIS — R739 Hyperglycemia, unspecified: Secondary | ICD-10-CM

## 2022-02-23 MED ORDER — ALBUTEROL SULFATE HFA 108 (90 BASE) MCG/ACT IN AERS: 2.0000 | INHALATION_SPRAY | Freq: Four times a day (QID) | RESPIRATORY_TRACT | 0 refills | Status: DC | PRN

## 2022-02-23 MED ORDER — EPINEPHRINE 0.3 MG/0.3ML IJ SOAJ
0.3000 mg | Freq: Once | INTRAMUSCULAR | 0 refills | Status: DC | PRN
Start: 2022-02-23 — End: 2022-12-21

## 2022-02-23 MED ORDER — FLUOCINOLONE ACETONIDE BODY 0.01 % EX OIL: TOPICAL_OIL | CUTANEOUS | 1 refills | Status: DC

## 2022-02-23 NOTE — Assessment & Plan Note (Signed)
She is down about 13 pounds over the last couple of months.  Congratulated patient on weight loss.  She is doing well with Ozempic 0.5 mg weekly.  She will check in with me again in a few weeks via MyChart. ?

## 2022-02-23 NOTE — Assessment & Plan Note (Signed)
She has not had much improvement with 1 times daily dosing of MiraLAX.  Recommended she switch to twice daily dosing.  She will let me know if not improving. ?

## 2022-02-23 NOTE — Patient Instructions (Signed)
It was very nice to see you today! ? ?Please try taking Allegra or fexofenadine to help with your allergies.  Let me know if not improving in the next 1 to 2 weeks and we can start Singulair. ? ?I will send an EpiPen.  I will refill your other medications. ? ?Please let us know if you would like for [place] to increase her dose of Ozempic. ? ?Take care, ?Dr Jerline Pain ? ?PLEASE NOTE: ? ?If you had any lab tests please let us know if you have not heard back within a few days. You may see your results on mychart before we have a chance to review them but we will give you a call once they are reviewed by [place]. If we ordered any referrals today, please let us know if you have not heard from their office within the next week.  ? ?Please try these tips to maintain a healthy lifestyle: ? ?Eat at least 3 REAL meals and 1-2 snacks per day.  Aim for no more than 5 hours between eating.  If you eat breakfast, please do so within one hour of getting up.  ? ?Each meal should contain half fruits/vegetables, one quarter protein, and one quarter carbs (no bigger than a computer mouse) ? ?Cut down on sweet beverages. This includes juice, soda, and sweet tea.  ? ?Drink at least 1 glass of water with each meal and aim for at least 8 glasses per day ? ?Exercise at least 150 minutes every week.   ?

## 2022-02-23 NOTE — Assessment & Plan Note (Signed)
We will send in prescription for EpiPen. ?

## 2022-02-23 NOTE — Assessment & Plan Note (Addendum)
We can recheck A1c next time we get blood work.  She is doing well on Ozempic 0.5 milligrams weekly. ?

## 2022-02-23 NOTE — Progress Notes (Signed)
? ?  Susan [name] is a 66 y.o. female who presents today for an office visit. ? ?Assessment/Plan:  ?Chronic Problems Addressed Today: ?Allergic rhinitis ?Worsened recently.  She has not had much improvement with Benadryl.  Recommended Allegra.  She will follow-up with me in a couple weeks if not improving we can consider trial of Singulair. ? ?Morbid obesity ([place]) ?She is down about 13 pounds over the last couple of months.  Congratulated patient on weight loss.  She is doing well with Ozempic 0.5 mg weekly.  She will check in with me again in a few weeks via MyChart. ? ?Constipation ?She has not had much improvement with 1 times daily dosing of MiraLAX.  Recommended she switch to twice daily dosing.  She will let me know if not improving. ? ?Bee sting allergy ?We will send in prescription for EpiPen. ? ?Hyperglycemia ?We can recheck A1c next time we get blood work.  She is doing well on Ozempic 0.5 milligrams weekly. ? ?  ?Subjective:  ?HPI: ? ?See A/p for status of chronic conditions.   ? ?   ?  ?Objective:  ?Physical Exam: ?BP 131/75 (BP Location: Left Arm)   Pulse 74   Temp (!) 97.5 ?F (36.4 ?C) (Temporal)   Ht 4' 11.5" (1.511 m)   Wt 224 lb (101.6 kg)   SpO2 99%   BMI 44.49 kg/m?   ?Wt Readings from Last 3 Encounters:  ?02/23/22 224 lb (101.6 kg)  ?11/15/21 235 lb 3.2 oz (106.7 kg)  ?08/29/21 237 lb 4 oz (107.6 kg)  ?Gen: No acute distress, resting comfortably ?CV: Regular rate and rhythm with no murmurs appreciated ?Pulm: Normal work of breathing, clear to auscultation bilaterally with no crackles, wheezes, or rhonchi ?Neuro: Grossly normal, moves all extremities ?Psych: Normal affect and thought content ? ?   ? ?Algis Greenhouse. Jerline Pain, MD ?02/23/2022 3:57 PM  ?

## 2022-02-23 NOTE — Assessment & Plan Note (Signed)
Worsened recently.  She has not had much improvement with Benadryl.  Recommended Allegra.  She will follow-up with me in a couple weeks if not improving we can consider trial of Singulair. ?

## 2022-03-09 ENCOUNTER — Telehealth: Payer: Self-pay

## 2022-03-09 ENCOUNTER — Other Ambulatory Visit: Payer: Self-pay

## 2022-03-09 MED ORDER — OZEMPIC (0.25 OR 0.5 MG/DOSE) 2 MG/3ML ~~LOC~~ SOPN: 0.5000 mg | PEN_INJECTOR | SUBCUTANEOUS | 0 refills | Status: DC

## 2022-03-09 NOTE — Telephone Encounter (Signed)
Rx sent into pt pharmacy [facility]

## 2022-03-09 NOTE — Telephone Encounter (Signed)
..   Encourage patient to contact the pharmacy for refills or they can request refills through [place]: 02/23/22  NEXT APPOINTMENT DATE: na  MEDICATION:  ozempic   Is the patient out of medication?   [facility]  Let patient know to contact pharmacy at the end of the day to make sure medication is ready.  Please notify patient to allow 48-72 hours to process  CLINICAL FILLS OUT ALL BELOW:   LAST REFILL:  QTY:  REFILL DATE:    OTHER COMMENTS:    Okay for refill?  Please advise

## 2022-03-15 ENCOUNTER — Telehealth: Payer: Self-pay | Admitting: *Deleted

## 2022-03-15 NOTE — Telephone Encounter (Signed)
Patient present in the clinic to ask for Rx ozempic refill next dose  Requesting for 90 days

## 2022-03-15 NOTE — Telephone Encounter (Signed)
Ok with me. Please place any necessary orders. 

## 2022-03-20 ENCOUNTER — Other Ambulatory Visit: Payer: Self-pay | Admitting: *Deleted

## 2022-03-20 MED ORDER — SEMAGLUTIDE (1 MG/DOSE) 4 MG/3ML ~~LOC~~ SOPN
1.0000 mg | PEN_INJECTOR | SUBCUTANEOUS | 0 refills | Status: DC
Start: 2022-03-20 — End: 2022-03-26

## 2022-03-20 NOTE — Telephone Encounter (Signed)
Per Dr Jerline Pain ok to send Rx Ozempic '1mg'$  weekly  Rx send to pharmacy

## 2022-03-26 ENCOUNTER — Other Ambulatory Visit: Payer: Self-pay | Admitting: *Deleted

## 2022-03-26 MED ORDER — SEMAGLUTIDE (1 MG/DOSE) 4 MG/3ML ~~LOC~~ SOPN: 1.0000 mg | PEN_INJECTOR | SUBCUTANEOUS | 0 refills | Status: AC

## 2022-03-26 NOTE — Telephone Encounter (Signed)
Pt called and stated medication was not ordered correctly. She would like a call back to discuss this.

## 2022-03-26 NOTE — Telephone Encounter (Signed)
Left message to return call to our office at their convenience.  

## 2022-03-26 NOTE — Telephone Encounter (Signed)
Patient called, advise to call pharmacy for refills, refilled send  on 03/20/2022

## 2022-03-27 ENCOUNTER — Other Ambulatory Visit: Payer: Self-pay | Admitting: *Deleted

## 2022-03-27 MED ORDER — OZEMPIC (1 MG/DOSE) 4 MG/3ML ~~LOC~~ SOPN: 1.0000 mg | PEN_INJECTOR | SUBCUTANEOUS | 2 refills | Status: DC

## 2022-03-27 NOTE — Telephone Encounter (Signed)
Patient states a script for generic ozempic was sent to her pharmacy.  Patient states that she does not want the generic b/c it is not covered by the FDA.  Also states that her ins will cover ozempic.  Patient is requesting new script to be sent.  Please follow up with patient in regard.

## 2022-03-28 NOTE — Telephone Encounter (Signed)
Spoke with pharmacy. Rx Ozempic was send

## 2022-03-30 ENCOUNTER — Other Ambulatory Visit: Payer: Self-pay | Admitting: Family Medicine

## 2022-04-12 ENCOUNTER — Other Ambulatory Visit: Payer: Self-pay | Admitting: Family Medicine

## 2022-04-29 ENCOUNTER — Other Ambulatory Visit: Payer: Self-pay | Admitting: Family Medicine

## 2022-05-27 ENCOUNTER — Other Ambulatory Visit: Payer: Self-pay | Admitting: Family Medicine

## 2022-06-01 ENCOUNTER — Telehealth: Payer: Self-pay | Admitting: Family Medicine

## 2022-06-01 ENCOUNTER — Other Ambulatory Visit: Payer: Self-pay | Admitting: *Deleted

## 2022-06-01 MED ORDER — OZEMPIC (1 MG/DOSE) 4 MG/3ML ~~LOC~~ SOPN: 1.0000 mg | PEN_INJECTOR | SUBCUTANEOUS | 2 refills | Status: DC

## 2022-06-01 NOTE — Telephone Encounter (Signed)
[facility]  

## 2022-06-01 NOTE — Telephone Encounter (Signed)
..   Encourage patient to contact the pharmacy for refills or they can request refills through [place]:  02/23/22  NEXT APPOINTMENT DATE: None  MEDICATION: Semaglutide, 1 MG/DOSE, 4 MG/3ML SOPN  Is the patient out of medication? Has a 3 week supply left  [facility] [place] [facility] [address]  [place], [place] [address]  Phone:  [phone]  Fax:  [phone]   Patient states: -She is taking '2mg'$  weekly injections - She wanted to get this refilled just in case it's not available later  The only prescription for ozempic states she is injecting 1 mg weekly.

## 2022-06-28 ENCOUNTER — Other Ambulatory Visit: Payer: Self-pay | Admitting: Family Medicine

## 2022-06-29 ENCOUNTER — Other Ambulatory Visit: Payer: Self-pay | Admitting: Family Medicine

## 2022-07-11 ENCOUNTER — Telehealth: Payer: Self-pay | Admitting: Family Medicine

## 2022-07-11 ENCOUNTER — Other Ambulatory Visit: Payer: Self-pay | Admitting: Family Medicine

## 2022-07-11 ENCOUNTER — Other Ambulatory Visit: Payer: Self-pay

## 2022-07-11 NOTE — Telephone Encounter (Signed)
Patient states she normally receives an 84 day supply of the following medication which equals 3 boxes [employer] covers 3 boxes), however, the last RX sent to Pharmacy was for only box.   Patient requests Rx for 3 boxes (84 days) of  Semaglutide, 1 MG/DOSE, (OZEMPIC, 1 MG/DOSE,) 4 MG/3ML SOPN   Be sent to   [place] [place] [facility] Phone:  [phone]  Fax:  [phone]     Patient states she will be out of the above medication by 07/17/22

## 2022-07-11 NOTE — Telephone Encounter (Signed)
I called pt and LVM letting her know an 84 day supply was sent into pharmacy and it is correct.

## 2022-07-12 ENCOUNTER — Ambulatory Visit: Payer: Medicare Other | Admitting: Family Medicine

## 2022-07-13 ENCOUNTER — Ambulatory Visit: Payer: Medicare Other | Admitting: Family Medicine

## 2022-07-16 ENCOUNTER — Other Ambulatory Visit: Payer: Self-pay | Admitting: *Deleted

## 2022-07-16 ENCOUNTER — Encounter: Payer: Self-pay | Admitting: *Deleted

## 2022-07-16 ENCOUNTER — Ambulatory Visit (INDEPENDENT_AMBULATORY_CARE_PROVIDER_SITE_OTHER): Payer: Medicare Other | Admitting: *Deleted

## 2022-07-16 DIAGNOSIS — Z0000 Encounter for general adult medical examination without abnormal findings: Secondary | ICD-10-CM

## 2022-07-16 MED ORDER — OZEMPIC (1 MG/DOSE) 4 MG/3ML ~~LOC~~ SOPN: 1.0000 mg | PEN_INJECTOR | SUBCUTANEOUS | 2 refills | Status: DC

## 2022-07-16 NOTE — Progress Notes (Signed)
Subjective:   Susan [name] is a 66 y.o. female who presents for Medicare Annual (Subsequent) preventive examination. I connected with  Susan [name] on 07/16/22 by a audio enabled telemedicine application and verified that I am speaking with the correct person using two identifiers.  Patient Location: Home  Provider Location: Home Office  I discussed the limitations of evaluation and management by telemedicine. The patient expressed understanding and agreed to proceed.  Review of Systems    Deferred to PCP Cardiac Risk Factors include: advanced age (>79mn, >>14women);hypertension;dyslipidemia;obesity (BMI >30kg/m2);sedentary lifestyle     Objective:    There were no vitals filed for this visit. There is no height or weight on file to calculate BMI.     07/16/2022    3:35 PM 09/25/2021    1:02 PM 07/12/2021    3:11 PM  Advanced Directives  Does Patient Have a Medical Advance Directive? Yes No Yes  Type of [place]  [place] will  Does patient want to make changes to medical advance directive? No - Patient declined  No - Patient declined  Copy of [place]? No - copy requested  No - copy requested  Would patient like information on creating a medical advance directive?  No - Patient declined     Current Medications (verified) Outpatient Encounter Medications as of 07/16/2022  Medication Sig   albuterol (PROAIR HFA) 108 (90 Base) MCG/ACT inhaler Inhale 2 puffs into the lungs every 6 (six) hours as needed for wheezing or shortness of breath.   cholecalciferol (VITAMIN D3) 25 MCG (1000 UNIT) tablet Take 3,000 Units by mouth daily.   diclofenac Sodium (VOLTAREN) 1 % GEL Apply 4 g to affected area up to 4 times daily.   EPINEPHrine 0.3 mg/0.3 mL IJ SOAJ injection Inject 0.3 mg into the muscle once as needed (anaphylaxis/allergic reaction).   ezetimibe (ZETIA) 10 MG  tablet TAKE ONE TABLET BY MOUTH ONE TIME DAILY   Fluocinolone Acetonide Body 0.01 % OIL APPLY TOPICALLY TO SCALP THREE TIMES PER WEEK   orphenadrine (NORFLEX) 100 MG tablet Take 100 mg by mouth as needed for muscle spasms.    Semaglutide, 1 MG/DOSE, (OZEMPIC, 1 MG/DOSE,) 4 MG/3ML SOPN Inject 1 mg into the skin once a week.   spironolactone (ALDACTONE) 25 MG tablet TAKE ONE TABLET BY MOUTH ONE TIME DAILY   calcium citrate (CALCITRATE - DOSED IN MG ELEMENTAL CALCIUM) 950 (200 Ca) MG tablet Take 200 mg of elemental calcium by mouth daily. (Patient not taking: Reported on 02/23/2022)   docusate sodium (COLACE) 100 MG capsule Take 100 mg by mouth daily. (Patient not taking: Reported on 02/23/2022)   finasteride (PROSCAR) 5 MG tablet Take by mouth. Takes half a pill daily (Patient not taking: Reported on 02/23/2022)   topiramate (TOPAMAX) 25 MG tablet Take 1 tablet by mouth every evening. (Patient not taking: Reported on 02/23/2022)   [DISCONTINUED] Semaglutide, 1 MG/DOSE, (OZEMPIC, 1 MG/DOSE,) 4 MG/3ML SOPN Inject 1 mg into the skin once a week.   No facility-administered encounter medications on file as of 07/16/2022.    Allergies (verified) Aspirin, Avelox [moxifloxacin hcl in nacl], Shellfish allergy, Bee venom, Cat hair extract, Ibuprofen, Codeine, Crestor [rosuvastatin calcium], Penicillins, Pravastatin sodium [pravachol], and Simvastatin   History: Past Medical History:  Diagnosis Date   Allergy    Anemia    Anxiety state, unspecified    Back injury    HLD (hyperlipidemia)  intol of statins - follows with lipid clinic   HTN (hypertension)    Irritable bowel syndrome    Lumbosacral spondylosis without myelopathy    improved with PT   Morbid obesity (HCC)    Past Surgical History:  Procedure Laterality Date   ABSCESS DRAINAGE     abdominal   CESAREAN SECTION  1977   Family History  Problem Relation Age of Onset   Breast cancer Mother    Diabetes Mother    Hypertension Mother     Multiple myeloma Mother    Diabetes Father        on dialysis   Kidney failure Father    Colon cancer Maternal Grandmother    Social History   Socioeconomic History   Marital status: Divorced    Spouse name: Not on file   Number of children: 1   Years of education: college   Highest education level: Not on file  Occupational History   Occupation: Retired    Comment: Restaurant manager, 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   Tobacco Use   Smoking status: Never   Smokeless tobacco: Never  Vaping Use   Vaping Use: Never used  Substance and Sexual Activity   Alcohol use: No   Drug use: No   Sexual activity: Not Currently  Other Topics Concern   Not on file  Social History Narrative   Lives alone, divorced since 4   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, clinical (histocompatibility and immunogenetics)   Social Determinants of Health   Financial Resource Strain: [place]  (07/16/2022)   Overall Financial Resource Strain (CARDIA)    Difficulty of Paying Living Expenses: Not hard at all  Food Insecurity: No Food Insecurity (07/16/2022)   Hunger Vital Sign    Worried About Running Out of Food in the Last Year: Never true    [place] in the Last Year: Never true  Transportation Needs: No Transportation Needs (07/16/2022)   PRAPARE - Hydrologist (Medical): No    Lack of Transportation (Non-Medical): No  Physical Activity: Inactive (07/16/2022)   Exercise Vital Sign    Days of Exercise per Week: 0 days    Minutes of Exercise per Session: 0 min  Stress: No Stress Concern Present (07/16/2022)   [place]    Feeling of Stress : Only a little  Social Connections: Socially Isolated (07/16/2022)   Social Connection and Isolation Panel [NHANES]    Frequency of Communication with Friends and Family: Once a week    Frequency of Social Gatherings with Friends and Family: Once a week    Attends Religious Services: Never     Marine scientist or Organizations: No    Attends Music therapist: Never    Marital Status: Divorced    Tobacco Counseling Counseling given: Not Answered   Clinical Intake:  Pre-visit preparation completed: Yes  Pain : No/denies pain     Nutritional Status: BMI > 30  Obese  How often do you need to have someone help you when you read instructions, pamphlets, or other written materials from your doctor or pharmacy?: 1 - Never What is the last grade level you completed in school?: college  Diabetic?No  Interpreter Needed?: No  Information entered by :: Emelia Loron RN   Activities of Daily Living    07/16/2022    3:34 PM  In  your present state of health, do you have any difficulty performing the following activities:  Hearing? 0  Vision? 0  Difficulty concentrating or making decisions? 0  Walking or climbing stairs? 0  Dressing or bathing? 0  Doing errands, shopping? 0  Preparing Food and eating ? N  Using the Toilet? N  In the past six months, have you accidently leaked urine? N  Do you have problems with loss of bowel control? N  Managing your Medications? N  Managing your Finances? N  Housekeeping or managing your Housekeeping? N    Patient Care Team: Vivi Barrack, MD as PCP - General (Family Medicine) Berle Mull, MD (Sports Medicine) Carol Ada, MD (Gastroenterology) Brien Few, MD (Obstetrics and Gynecology)  Indicate any recent Medical Services you may have received from other than [facility] providers in the past year (date may be approximate).     Assessment:   This is a routine wellness examination for Susan [name].  Hearing/Vision screen No results found.  Dietary issues and exercise activities discussed: Current Exercise Habits: The patient does not participate in regular exercise at present, Exercise limited by: orthopedic condition(s)   Goals Addressed             This Visit's Progress    Patient Stated       I  want to continue to lose weight and increase my physical activity. I am thinking about joining a gym.      Depression Screen    07/16/2022    3:32 PM 07/12/2021    3:12 PM 07/12/2021    3:09 PM 11/03/2020   10:29 AM 10/27/2019    9:13 AM 01/29/2013    9:00 AM  PHQ 2/9 Scores  PHQ - 2 Score 0 0 0 0 0 1    Fall Risk    07/16/2022    3:37 PM 07/12/2021    3:12 PM  [place] in the past year? 0 1  Number falls in past yr: 0 0  Injury with Fall? 0 0  Risk for fall due to : History of fall(s) No Fall Risks  Follow up Falls evaluation completed     [place]:  Any stairs in or around the home? No  If so, are there any without handrails? No  Home free of loose throw rugs in walkways, pet beds, electrical cords, etc? No  Adequate lighting in your home to reduce risk of falls? No   ASSISTIVE DEVICES UTILIZED TO PREVENT FALLS:  Life alert? No  Use of a cane, walker or w/c? No  Grab bars in the bathroom? Yes  Shower chair or bench in shower? No  Elevated toilet seat or a handicapped toilet? Yes   Cognitive Function:        07/16/2022    3:38 PM 07/12/2021    3:13 PM  6CIT Screen  What Year? 0 points   What month? 0 points 0 points  What time? 0 points 0 points  Count back from 20 0 points 0 points  Months in reverse 0 points 0 points  Repeat phrase 0 points 0 points  Total Score 0 points     Immunizations Immunization History  Administered Date(s) Administered   Fluad Quad(high Dose 65+) 08/09/2020   Influenza, High Dose Seasonal PF 07/10/2021   Influenza,inj,Quad PF,6+ Mos 09/04/2019   Influenza-Unspecified 08/09/2020   PFIZER(Purple Top)SARS-COV-2 Vaccination 12/09/2019, 12/30/2019, 07/23/2020, 02/27/2021   PNEUMOCOCCAL CONJUGATE-20 11/15/2021   Pfizer Covid-19  Vaccine Bivalent Booster 89yr & up 08/14/2021   Tdap 02/05/2014, 02/27/2016   Zoster Recombinat (Shingrix) 07/06/2020, 01/06/2021   Zoster, Live 01/06/2021     TDAP status: Up to date  Flu Vaccine status: Due, Education has been provided regarding the importance of this vaccine. Advised may receive this vaccine at local pharmacy or Health Dept. Aware to provide a copy of the vaccination record if obtained from local pharmacy or Health Dept. Verbalized acceptance and understanding.  Pneumococcal vaccine status: Due, Education has been provided regarding the importance of this vaccine. Advised may receive this vaccine at local pharmacy or Health Dept. Aware to provide a copy of the vaccination record if obtained from local pharmacy or Health Dept. Verbalized acceptance and understanding.  Covid-19 vaccine status: Information provided on how to obtain vaccines.   Qualifies for Shingles Vaccine? No   Zostavax completed No   Shingrix Completed?: Yes  Screening Tests Health Maintenance  Topic Date Due   COVID-19 Vaccine (6 - Pfizer risk series) 10/09/2021   INFLUENZA VACCINE  05/22/2022   COLONOSCOPY (Pts 45-449yrInsurance coverage will need to be confirmed)  03/22/2023   MAMMOGRAM  09/23/2023   TETANUS/TDAP  02/26/2026   Pneumonia Vaccine 6576Years old  Completed   DEXA SCAN  Completed   Hepatitis C Screening  Completed   Zoster Vaccines- Shingrix  Completed   HPV VACCINES  Aged Out    Health Maintenance  Health Maintenance Due  Topic Date Due   COVID-19 Vaccine (6 - Pfizer risk series) 10/09/2021   INFLUENZA VACCINE  05/22/2022    Colorectal cancer screening: Type of screening: Colonoscopy. Completed 03/21/18. Repeat every 5 years  Mammogram status: Completed 02/20/21. Repeat every year  Bone Density status: Completed 03/04/20. Results reflect: Bone density results: OSTEOPOROSIS. Repeat every 2 years.  Lung Cancer Screening: (Low Dose CT Chest recommended if Age 67-80ears, 30 pack-year currently smoking OR have quit w/in 15years.) does not qualify.   Additional Screening:  Hepatitis C Screening: does qualify; Completed  10/06/18  Vision Screening: Recommended annual ophthalmology exams for early detection of glaucoma and other disorders of the eye. Is the patient up to date with their annual eye exam?  No , patient states she will make an appointment Who is the provider or what is the name of the office in which the patient attends annual eye exams? [facility] If pt is not established with a provider, would they like to be referred to a provider to establish care?  N/A .   Dental Screening: Recommended annual dental exams for proper oral hygiene  Community Resource Referral / Chronic Care Management: CRR required this visit?  No   CCM required this visit?  No      Plan:     I have personally reviewed and noted the following in the patient's chart:   Medical and social history Use of alcohol, tobacco or illicit drugs  Current medications and supplements including opioid prescriptions. Patient is not currently taking opioid prescriptions. Functional ability and status Nutritional status Physical activity Advanced directives List of other physicians Hospitalizations, surgeries, and ER visits in previous 12 months Vitals Screenings to include cognitive, depression, and falls Referrals and appointments  In addition, I have reviewed and discussed with patient certain preventive protocols, quality metrics, and best practice recommendations. A written personalized care plan for preventive services as well as general preventive health recommendations were provided to patient.     JiMichiel CowboyRN   07/16/2022  Nurse Notes:  Susan [name] , Thank you for taking time to come for your Medicare Wellness Visit. I appreciate your ongoing commitment to your health goals. Please review the following plan we discussed and let me know if I can assist you in the future.   These are the goals we discussed:  Goals      LDL CALC < 130     Patient Stated     I want to continue to lose weight and increase  my physical activity. I am thinking about joining a gym.        This is a list of the screening recommended for you and due dates:  Health Maintenance  Topic Date Due   COVID-19 Vaccine (6 - Pfizer risk series) 10/09/2021   Flu Shot  05/22/2022   Colon Cancer Screening  03/22/2023   Mammogram  09/23/2023   Tetanus Vaccine  02/26/2026   Pneumonia Vaccine  Completed   DEXA scan (bone density measurement)  Completed   Hepatitis C Screening: USPSTF Recommendation to screen - Ages 48-79 yo.  Completed   Zoster (Shingles) Vaccine  Completed   HPV Vaccine  Aged Out

## 2022-07-16 NOTE — Telephone Encounter (Signed)
Rx resend to day for 90 days supply

## 2022-07-16 NOTE — Patient Instructions (Signed)

## 2022-07-19 ENCOUNTER — Encounter: Payer: Self-pay | Admitting: Family Medicine

## 2022-07-19 ENCOUNTER — Ambulatory Visit (INDEPENDENT_AMBULATORY_CARE_PROVIDER_SITE_OTHER): Payer: Medicare Other | Admitting: Family Medicine

## 2022-07-19 VITALS — BP 144/81 | HR 78 | Temp 98.8°F | Ht 59.5 in | Wt 213.8 lb

## 2022-07-19 DIAGNOSIS — J309 Allergic rhinitis, unspecified: Secondary | ICD-10-CM

## 2022-07-19 DIAGNOSIS — I10 Essential (primary) hypertension: Secondary | ICD-10-CM

## 2022-07-19 DIAGNOSIS — R079 Chest pain, unspecified: Secondary | ICD-10-CM | POA: Diagnosis not present

## 2022-07-19 DIAGNOSIS — R739 Hyperglycemia, unspecified: Secondary | ICD-10-CM

## 2022-07-19 LAB — POCT GLYCOSYLATED HEMOGLOBIN (HGB A1C): Hemoglobin A1C: 5.4 % (ref 4.0–5.6)

## 2022-07-19 MED ORDER — MONTELUKAST SODIUM 10 MG PO TABS: 10.0000 mg | ORAL_TABLET | Freq: Every day | ORAL | 0 refills | Status: DC

## 2022-07-19 MED ORDER — BENZONATATE 200 MG PO CAPS: 200.0000 mg | ORAL_CAPSULE | Freq: Two times a day (BID) | ORAL | 0 refills | Status: DC | PRN

## 2022-07-19 NOTE — Patient Instructions (Signed)
It was very nice to see you today!  Your A1c today is 5.4.  Would continue your current dose of Ozempic.  Please let me know if you would like to increase the dose.  Please try the Singulair and Tessalon for your cough and runny nose.  I will refer you to see a cardiologist.  Please come back to see me in January when you are due for your physical.  Come back sooner if needed.  Take care, Dr Jerline Pain  PLEASE NOTE:  If you had any lab tests please let us know if you have not heard back within a few days. You may see your results on mychart before we have a chance to review them but we will give you a call once they are reviewed by [place]. If we ordered any referrals today, please let us know if you have not heard from their office within the next week.   Please try these tips to maintain a healthy lifestyle:  Eat at least 3 REAL meals and 1-2 snacks per day.  Aim for no more than 5 hours between eating.  If you eat breakfast, please do so within one hour of getting up.   Each meal should contain half fruits/vegetables, one quarter protein, and one quarter carbs (no bigger than a computer mouse)  Cut down on sweet beverages. This includes juice, soda, and sweet tea.   Drink at least 1 glass of water with each meal and aim for at least 8 glasses per day  Exercise at least 150 minutes every week.

## 2022-07-19 NOTE — Assessment & Plan Note (Signed)
At goal per JNC 8 though slightly above baseline for her.  She is typically in the 130s over 70s to 80s.  We will continue current medication regimen of spironolactone 25 mg daily.  She can continue to monitor at home and let us know if persistently elevated.

## 2022-07-19 NOTE — Addendum Note (Signed)
Addended by: Vivi Barrack on: 07/19/2022 10:31 AM   Modules accepted: Orders

## 2022-07-19 NOTE — Assessment & Plan Note (Signed)
Likely having a mild flare.  She has had several rounds of antibiotics recently including azithromycin and clindamycin.  She is currently on clindamycin for dental infection.  Do not think we need to add on any additional antibiotics at this point.  We will start Tessalon for cough.  We will also add on Singulair.  She can continue using over-the-counter medications as well.

## 2022-07-19 NOTE — Assessment & Plan Note (Signed)
A1c much better at 5.4.  She is down about 9 pounds since her last visit.  We will continue current dose of Ozempic 1 mg weekly.  We did discuss potentially increasing the dose however we will continue current dose for now.  She will come back in a few months for CPE and we can recheck A1c at that time.

## 2022-07-19 NOTE — Progress Notes (Signed)
   Susan [name] is a 66 y.o. female who presents today for an office visit.  Assessment/Plan:  New/Acute Problems: Chest Pain Atypical for cardiac etiology.  Not currently having any chest pain and has not had any pain for the last week.  Reassuring exam today.  Her episode was potentially related to her dental work and anesthesia that she has had over the last couple of weeks.  However she does have a few risk factors including dyslipidemia, hypertension, and age.  Would be reasonable to rule out coronary artery disease.  We will place referral to cardiology for further management.  We discussed strict reasons to return to care and seek emergent care.  Chronic Problems Addressed Today: Essential hypertension At goal per JNC 8 though slightly above baseline for her.  She is typically in the 130s over 70s to 80s.  We will continue current medication regimen of spironolactone 25 mg daily.  She can continue to monitor at home and let us know if persistently elevated.  Allergic rhinitis Likely having a mild flare.  She has had several rounds of antibiotics recently including azithromycin and clindamycin.  She is currently on clindamycin for dental infection.  Do not think we need to add on any additional antibiotics at this point.  We will start Tessalon for cough.  We will also add on Singulair.  She can continue using over-the-counter medications as well.  Hyperglycemia A1c much better at 5.4.  She is down about 9 pounds since her last visit.  We will continue current dose of Ozempic 1 mg weekly.  We did discuss potentially increasing the dose however we will continue current dose for now.  She will come back in a few months for CPE and we can recheck A1c at that time.     Subjective:  HPI:  See Ap for status of chronic conditions.   She did have an episode of chest pain last week while getting dental work done. She had a tooth pulled and had several fragments left in that had to be  removed via surgery. She had to be on several rounds of antibiotics. The chest pain happened after coming home after having the anesthesia. This lasted for about week. She is not currently having any chest pain. Symptoms were stable over the week. No worsening with extertion.   She has also had some nasal congestion for the last several days. Some fevers and chills. Covid test was negative. Tried OTC meds without much improvement.  Symptoms have been stable last couple of days.       Objective:  Physical Exam: BP (!) 144/81   Pulse 78   Temp 98.8 F (37.1 C) (Temporal)   Ht 4' 11.5" (1.511 m)   Wt 213 lb 12.8 oz (97 kg)   SpO2 99%   BMI 42.46 kg/m   Wt Readings from Last 3 Encounters:  07/19/22 213 lb 12.8 oz (97 kg)  02/23/22 224 lb (101.6 kg)  11/15/21 235 lb 3.2 oz (106.7 kg)  Gen: No acute distress, resting comfortably TMs with clear effusion.  OP erythematous.  Nose mucosa erythematous and boggy bilaterally. CV: Regular rate and rhythm with no murmurs appreciated Pulm: Normal work of breathing, clear to auscultation bilaterally with no crackles, wheezes, or rhonchi Neuro: Grossly normal, moves all extremities Psych: Normal affect and thought content      Kylo Gavin M. Jerline Pain, MD 07/19/2022 10:29 AM

## 2022-08-10 ENCOUNTER — Other Ambulatory Visit: Payer: Self-pay | Admitting: Obstetrics and Gynecology

## 2022-08-10 DIAGNOSIS — Z139 Encounter for screening, unspecified: Secondary | ICD-10-CM

## 2022-08-13 ENCOUNTER — Telehealth: Payer: Self-pay | Admitting: *Deleted

## 2022-08-13 NOTE — Telephone Encounter (Signed)
I would recommend that she come in 4/24 for a breast/pelvic and pap

## 2022-08-13 NOTE — Telephone Encounter (Signed)
Patient informed. Message sent to appointments to schedule.

## 2022-08-13 NOTE — Telephone Encounter (Signed)
Patient called asking when is her next pap smear due? I don't seen a GYN exam only pelvic mass visit in 2022. Please advise

## 2022-08-16 ENCOUNTER — Ambulatory Visit: Payer: Medicare Other | Attending: Internal Medicine | Admitting: Internal Medicine

## 2022-08-16 ENCOUNTER — Encounter: Payer: Self-pay | Admitting: Internal Medicine

## 2022-08-16 VITALS — BP 136/90 | HR 70 | Ht 59.5 in | Wt 213.2 lb

## 2022-08-16 DIAGNOSIS — Z79899 Other long term (current) drug therapy: Secondary | ICD-10-CM | POA: Diagnosis present

## 2022-08-16 DIAGNOSIS — I10 Essential (primary) hypertension: Secondary | ICD-10-CM

## 2022-08-16 NOTE — Progress Notes (Signed)
Cardiology Office Note   Date:  08/16/2022   ID:  Susan [name], DOB Mar 16, 1956, MRN [id number]  PCP:  Vivi Barrack, MD  Cardiologist:   Dorris Carnes, MD   Patient referred for eval of CP     History of Present Illness: Susan [name] is a 66 y.o. female with a history of HTN, HL    Pt has a hx of CP   Patient says in [place] has had some CP when tooth extracted   She said she got a lot of anethetic in her mouth.   Discomfort on and off that afternoon   Presure   Last about 2 to 3 min   Not with activity  No exacerbating events   Patient walks at the  store   Exercises some  Does  Sit ups  Tummy tuck    Diet: Br:  Raisin bran cereal   Apples   banana Lunch   2 peanut butter crakers Dinner   Baked chicken   Veggies Coffee in am black;Water    Current Meds  Medication Sig   albuterol (PROAIR HFA) 108 (90 Base) MCG/ACT inhaler Inhale 2 puffs into the lungs every 6 (six) hours as needed for wheezing or shortness of breath.   cholecalciferol (VITAMIN D3) 25 MCG (1000 UNIT) tablet Take 3,000 Units by mouth daily.   diclofenac Sodium (VOLTAREN) 1 % GEL Apply 4 g to affected area up to 4 times daily.   docusate sodium (COLACE) 100 MG capsule Take 100 mg by mouth daily.   EPINEPHrine 0.3 mg/0.3 mL IJ SOAJ injection Inject 0.3 mg into the muscle once as needed (anaphylaxis/allergic reaction).   ezetimibe (ZETIA) 10 MG tablet TAKE ONE TABLET BY MOUTH ONE TIME DAILY   Fluocinolone Acetonide Body 0.01 % OIL APPLY TOPICALLY TO SCALP THREE TIMES PER WEEK   orphenadrine (NORFLEX) 100 MG tablet Take 100 mg by mouth as needed for muscle spasms.    Semaglutide, 1 MG/DOSE, (OZEMPIC, 1 MG/DOSE,) 4 MG/3ML SOPN Inject 1 mg into the skin once a week.   spironolactone (ALDACTONE) 25 MG tablet TAKE ONE TABLET BY MOUTH ONE TIME DAILY     Allergies:   Aspirin, Avelox [moxifloxacin hcl in nacl], Shellfish allergy, Bee venom, Cat hair extract, Ibuprofen, Codeine, Crestor [rosuvastatin  calcium], Penicillins, Pravastatin sodium [pravachol], and Simvastatin   Past Medical History:  Diagnosis Date   Allergy    Anemia    Anxiety state, unspecified    Back injury    HLD (hyperlipidemia)    intol of statins - follows with lipid clinic   HTN (hypertension)    Irritable bowel syndrome    Lumbosacral spondylosis without myelopathy    improved with PT   Morbid obesity ([place])     Past Surgical History:  Procedure Laterality Date   ABSCESS DRAINAGE     abdominal   CESAREAN SECTION  1977     Social History:  The patient  reports that she has never smoked. She has never used smokeless tobacco. She reports that she does not drink alcohol and does not use drugs.   Family History:  The patient's family history includes Breast cancer in her mother; Colon cancer in her maternal grandmother; Diabetes in her father and mother; Hypertension in her mother; Kidney failure in her father; Multiple myeloma in her mother.    ROS:  Please see the history of present illness. All other systems are reviewed and  Negative to the above problem except as  noted.    PHYSICAL EXAM: VS:  BP (!) 136/90   Pulse 70   Ht 4' 11.5" (1.511 m)   Wt 213 lb 3.2 oz (96.7 kg)   SpO2 99%   BMI 42.34 kg/m   GEN: Well nourished, well developed, in no acute distress  HEENT: normal  Neck: no JVD, carotid bruits Cardiac: RRR; no murmur  No LE edema  Respiratory:  clear to auscultation bilaterally, GI: soft, nontender, nondistended, + BS  No hepatomegaly  MS: no deformity Moving all extremities   Skin: warm and dry, no rash Neuro:  Strength and sensation are intact Psych: euthymic mood, full affect   EKG:  EKG is ordered today.  NSR  70 bpm  LVH      Lipid Panel    Component Value Date/Time   CHOL 215 (H) 11/15/2021 1019   TRIG 67.0 11/15/2021 1019   HDL 79.80 11/15/2021 1019   CHOLHDL 3 11/15/2021 1019   VLDL 13.4 11/15/2021 1019   LDLCALC 122 (H) 11/15/2021 1019   LDLDIRECT 117.3  01/14/2013 0839      Wt Readings from Last 3 Encounters:  08/16/22 213 lb 3.2 oz (96.7 kg)  07/19/22 213 lb 12.8 oz (97 kg)  02/23/22 224 lb (101.6 kg)      ASSESSMENT AND PLAN:  1 Chest pain  Atypical   I do not think it represents ischemia   Follow   2 Atherosclerosois   Of aorta  Seen on CT scan  Rx risk factors  3 HTN  BP is high today    148/92 on my check    Check BMET    Will review labs  4  HL   On Zetia since earlier in year  Did not tolerate statins   WIll check lipomed, Lap and Apo B     Current medicines are reviewed at length with the patient today.  The patient does not have concerns regarding medicines.  Signed, Dorris Carnes, MD  08/16/2022 3:21 PM    [facility] [address], [place], [place]  [address] Phone: [phone]; Fax: [phone]

## 2022-08-16 NOTE — Telephone Encounter (Signed)
Patient 02/19/2023

## 2022-08-16 NOTE — Patient Instructions (Addendum)
Medication Instructions:   *If you need a refill on your cardiac medications before your next appointment, please call your pharmacy*   Lab Work: Nmr, apo b, lipo a, bmet  If you have labs (blood work) drawn today and your tests are completely normal, you will receive your results only by: [place] (if you have MyChart) OR A paper copy in the mail If you have any lab test that is abnormal or we need to change your treatment, we will call you to review the results.   Testing/Procedures:    Follow-Up: At [facility], you and your health needs are our priority.  As part of our continuing mission to provide you with exceptional heart care, we have created designated Provider Care Teams.  These Care Teams include your primary Cardiologist (physician) and Advanced Practice Providers (APPs -  Physician Assistants and Nurse Practitioners) who all work together to provide you with the care you need, when you need it.  We recommend signing up for the patient portal called "MyChart".  Sign up information is provided on this After Visit Summary.  MyChart is used to connect with patients for Virtual Visits (Telemedicine).  Patients are able to view lab/test results, encounter notes, upcoming appointments, etc.  Non-urgent messages can be sent to your provider as well.   To learn more about what you can do with MyChart, go to [email or web address].    Your next appointment:   2 month(s)  The format for your next appointment:   In Person Dr Dorris Carnes    Other Instructions   Important Information About Sugar

## 2022-08-18 LAB — NMR, LIPOPROFILE
Cholesterol, Total: 247 mg/dL — ABNORMAL HIGH (ref 100–199)
HDL Particle Number: 39.9 umol/L (ref >=30.5)
HDL-C: 96 mg/dL (ref >39)
LDL Particle Number: 1414 nmol/L — ABNORMAL HIGH (ref <1000)
LDL Size: 21.5 nm (ref >20.5)
LDL-C (NIH Calc): 135 mg/dL — ABNORMAL HIGH (ref 0–99)
LP-IR Score: <25 (ref <=45)
Small LDL Particle Number: <90 nmol/L (ref <=527)
Triglycerides: 97 mg/dL (ref 0–149)

## 2022-08-18 LAB — BASIC METABOLIC PANEL
BUN/Creatinine Ratio: 15 (ref 12–28)
BUN: 11 mg/dL (ref 8–27)
CO2: 23 mmol/L (ref 20–29)
Calcium: 10.2 mg/dL (ref 8.7–10.3)
Chloride: 99 mmol/L (ref 96–106)
Creatinine, Ser: 0.71 mg/dL (ref 0.57–1.00)
Glucose: 97 mg/dL (ref 70–99)
Potassium: 4.9 mmol/L (ref 3.5–5.2)
Sodium: 139 mmol/L (ref 134–144)
eGFR: 93 mL/min/{1.73_m2} (ref >59)

## 2022-08-18 LAB — APOLIPOPROTEIN B: Apolipoprotein B: 101 mg/dL — ABNORMAL HIGH (ref <90)

## 2022-08-18 LAB — LIPOPROTEIN A (LPA): Lipoprotein (a): 67.8 nmol/L (ref <75.0)

## 2022-08-20 ENCOUNTER — Telehealth: Payer: Self-pay

## 2022-08-20 DIAGNOSIS — E785 Hyperlipidemia, unspecified: Secondary | ICD-10-CM

## 2022-08-20 DIAGNOSIS — Z79899 Other long term (current) drug therapy: Secondary | ICD-10-CM

## 2022-08-20 NOTE — Telephone Encounter (Signed)
Referral placed for the Lipid Clinic.

## 2022-08-20 NOTE — Telephone Encounter (Signed)
-----   Message from Fay Records, MD sent at 08/20/2022  4:37 PM EDT ----- Patient with atherosclerosis of aorta   LDL is higher than it should be She did not tolerate statins  On Zetia Would recomm trial of Repatha to get LDL lower  Electrolytes , kidney function OK

## 2022-08-27 ENCOUNTER — Telehealth: Payer: Self-pay | Admitting: *Deleted

## 2022-08-27 NOTE — Patient Outreach (Signed)
  Care Coordination   08/27/2022 Name: Susan [name] MRN: [id number] DOB: 1956-02-19   Care Coordination Outreach Attempts:  An unsuccessful telephone outreach was attempted today to offer the patient information about available care coordination services as a benefit of their health plan.   Follow Up Plan:  Additional outreach attempts will be made to offer the patient care coordination information and services.   Encounter Outcome:  No Answer  Care Coordination Interventions Activated:  No   Care Coordination Interventions:  No, not indicated    Raina Mina, RN Care Management Coordinator [place] Office [phone]

## 2022-08-28 ENCOUNTER — Ambulatory Visit: Payer: Medicare Other | Attending: Internal Medicine | Admitting: Pharmacist

## 2022-08-28 DIAGNOSIS — E785 Hyperlipidemia, unspecified: Secondary | ICD-10-CM | POA: Diagnosis not present

## 2022-08-28 DIAGNOSIS — I10 Essential (primary) hypertension: Secondary | ICD-10-CM | POA: Diagnosis not present

## 2022-08-28 MED ORDER — ATORVASTATIN CALCIUM 10 MG PO TABS: 10.0000 mg | ORAL_TABLET | Freq: Every day | ORAL | 3 refills | Status: DC

## 2022-08-28 NOTE — Assessment & Plan Note (Addendum)
Assessment: Statin history: remember that Crestor caused headaches. Does not remember taking other statins or if any side effects with them. Reported never experiencing myalgias. Has used fenofibrate  in the past Physical activity: going to start going to gym, has lost some weight on ozempic, but wants to lose more weight-has a step goal Watches diet and incorporates vegetables and fruits into meals, and limits carb intake, and no red meat Lipid Panel: elevated LDL-C and apolipoprotein B; LP(a) WNL No adverse effects with her zetia  Discussed drug treatment options and additional LDL-C lowering potential: statin (~30-40%) vs PCSK9i (~50-60%) vs nexlizet (~20%) and their adverse effects  Plan: Start atorvastatin '10mg'$  QD +continuing zetia '10mg'$  daily Monitor for signs/symptoms of myalgias, headaches Follow up fasting lipid panel in 6 weeks (10/08/2022) Increase physical activity, start moderate strenuous activity: have HR elevated; step goal (8,000-9,000 steps) Continue to stick with diet and eat fruits/vegetables for fiber benefit

## 2022-08-28 NOTE — Patient Instructions (Addendum)
Please start taking atorvastatin '10mg'$  daily Continue taking ezetimibe '10mg'$  daily  Repeat labs 12/18 at appointment with Dr. Harrington Challenger  Please call me at [phone] with any questions  Your blood pressure goal is <130/80  To check your pressure at home you will need to:  1. Sit up in a chair, with feet flat on the floor and back supported. Do not cross your ankles or legs. 2. Rest your left arm so that the cuff is about heart level. If the cuff goes on your upper arm,  then just relax the arm on the table, arm of the chair or your lap. If you have a wrist cuff, we  suggest relaxing your wrist against your chest (think of it as Pledging the Flag with the  wrong arm).  3. Place the cuff snugly around your arm, about 1 inch above the crook of your elbow. The  cords should be inside the groove of your elbow.  4. Sit quietly, with the cuff in place, for about 5 minutes. After that 5 minutes press the power  button to start a reading. 5. Do not talk or move while the reading is taking place.  6. Record your readings on a sheet of paper. Although most cuffs have a memory, it is often  easier to see a pattern developing when the numbers are all in front of you.  7. You can repeat the reading after 1-3 minutes if it is recommended  Make sure your bladder is empty and you have not had caffeine or tobacco within the last 30 min  Always bring your blood pressure log with you to your appointments. If you have not brought your monitor in to be double checked for accuracy, please bring it to your next appointment.  You can find a list of validated (accurate) blood pressure cuffs at PopPath.it   Important lifestyle changes to control high blood pressure  Intervention  Effect on the BP  Lose extra pounds and watch your waistline Weight loss is one of the most effective lifestyle changes for controlling blood pressure. If you're overweight or obese, losing even a small amount of weight can help  reduce blood pressure. Blood pressure might go down by about 1 millimeter of mercury (mm Hg) with each kilogram (about 2.2 pounds) of weight lost.  Exercise regularly As a general goal, aim for at least 30 minutes of moderate physical activity every day. Regular physical activity can lower high blood pressure by about 5 to 8 mm Hg.  Eat a healthy diet Eating a diet rich in whole grains, fruits, vegetables, and low-fat dairy products and low in saturated fat and cholesterol. A healthy diet can lower high blood pressure by up to 11 mm Hg.  Reduce salt (sodium) in your diet Even a small reduction of sodium in the diet can improve heart health and reduce high blood pressure by about 5 to 6 mm Hg.  Limit alcohol One drink equals 12 ounces of beer, 5 ounces of wine, or 1.5 ounces of 80-proof liquor.  Limiting alcohol to less than one drink a day for women or two drinks a day for men can help lower blood pressure by about 4 mm Hg.   Please call me at [phone] with any questions.

## 2022-08-28 NOTE — Assessment & Plan Note (Addendum)
Assessment: Uses wrist cuff to check blood pressure Bp goal <130/80  Plan: Discussed proper technique using wrist cuff and taking blood pressure (be well rested, emptied bladder, feet flat on floor) Bring cuff and home readings to next appointment with Dr. Harrington Challenger 12/18 for assessment

## 2022-08-28 NOTE — Progress Notes (Signed)
Patient ID: Susan [name]                 DOB: 02-28-1956                    MRN: [id number]      HPI: Susan [name] is a 66 y.o. female patient referred to lipid clinic by Dr. Harrington Challenger. PMH is significant for HTN and HLD. Patient [facility] with [facility] in October for atypical chest pain after she had a tooth extracted. Patient is following up to discuss medication management of hyperlipidemia. Has a history of not tolerating statins, previously trialed crestor, simvastatin, pravastatin, and atorvastatin, but does not remember what side effects she had and reports no history of myalgias. Used to take fenofibrate for HLD.  Current Lipid Medications: zetia Intolerances: Statins (crestor, atorvastatin 55m, lovastatin 260m pravastatin, simvastatin) Risk Factors: HTN LDL goal: <100 (primary prevention)  Diet: Drinks water, black coffee, no soda/juice No bread-if does eat bread/wheat bread No red meat Eats salads, stir-fry, vegetables, puts fruits in breakfast  Exercise: Has hx of back injury from work, but enjoys walking. Is increasing her step count and starting the gym next week  Family History:  Family History  Problem Relation Age of Onset   Breast cancer Mother    Diabetes Mother    Hypertension Mother    Multiple myeloma Mother    Diabetes Father        on dialysis   Kidney failure Father    Colon cancer Maternal Grandmother      Social History:   reports that she has never smoked. She has never used smokeless tobacco. She reports that she does not drink alcohol and does not use drugs.   Home BP readings: 11/6: 136/75; HR 69  Labs: 10/26 apolipoprotein B: 101 (ezetimibe 1030maily) 10/26 Lipoprotein(a): 67.8 10/26 Lipid Panel: LDL-C 135, HDL 96, TC 247 (ezetimibe 5m66mily)  Past Medical History:  Diagnosis Date   Allergy    Anemia    Anxiety state, unspecified    Back injury    HLD (hyperlipidemia)    intol of statins - follows with lipid clinic    HTN (hypertension)    Irritable bowel syndrome    Lumbosacral spondylosis without myelopathy    improved with PT   Morbid obesity ([facility])     Current Outpatient Medications on File Prior to Visit  Medication Sig Dispense Refill   albuterol (PROAIR HFA) 108 (90 Base) MCG/ACT inhaler Inhale 2 puffs into the lungs every 6 (six) hours as needed for wheezing or shortness of breath. 1 each 0   cholecalciferol (VITAMIN D3) 25 MCG (1000 UNIT) tablet Take 3,000 Units by mouth daily.     diclofenac Sodium (VOLTAREN) 1 % GEL Apply 4 g to affected area up to 4 times daily.     docusate sodium (COLACE) 100 MG capsule Take 100 mg by mouth daily.     EPINEPHrine 0.3 mg/0.3 mL IJ SOAJ injection Inject 0.3 mg into the muscle once as needed (anaphylaxis/allergic reaction). 2 each 0   ezetimibe (ZETIA) 10 MG tablet TAKE ONE TABLET BY MOUTH ONE TIME DAILY 90 tablet 0   Fluocinolone Acetonide Body 0.01 % OIL APPLY TOPICALLY TO SCALP THREE TIMES PER WEEK 118.28 mL 0   orphenadrine (NORFLEX) 100 MG tablet Take 100 mg by mouth as needed for muscle spasms.      Semaglutide, 1 MG/DOSE, (OZEMPIC, 1 MG/DOSE,) 4 MG/3ML SOPN Inject 1 mg into the  skin once a week. 9 mL 2   spironolactone (ALDACTONE) 25 MG tablet TAKE ONE TABLET BY MOUTH ONE TIME DAILY 90 tablet 0   No current facility-administered medications on file prior to visit.    Allergies  Allergen Reactions   Aspirin     Other reaction(s): Bleeding   Avelox [Moxifloxacin Hcl In Nacl] Anaphylaxis    Pt c/o swelling of her throat and tongue   Shellfish Allergy Anaphylaxis and Hives    Rash and itching Rash and itching   Bee Venom Hives   Cat Hair Extract Other (See Comments)   Ibuprofen Other (See Comments)    Gastric bleeding Gastric bleeding   Codeine Other (See Comments)    REACTION: causes her heart to race   Crestor [Rosuvastatin Calcium]     Pt states INTOLERANT w/ muscle cramps   Penicillins Hives    REACTION: rash   Pravastatin Sodium  [Pravachol]     REACTION: intolerant= headache & insomnia   Simvastatin     REACTION: intolerant= headache & insomnia    Assessment/Plan:  1. Hyperlipidemia -   Dyslipidemia Assessment: Statin history: remember that Crestor caused headaches. Does not remember taking other statins or if any side effects with them. Reported never experiencing myalgias. Has used fenofibrate  in the past Physical activity: going to start going to gym, has lost some weight on ozempic, but wants to lose more weight-has a step goal Watches diet and incorporates vegetables and fruits into meals, and limits carb intake, and no red meat Lipid Panel: elevated LDL-C and apolipoprotein B; LP(a) WNL No adverse effects with her zetia  Discussed drug treatment options and additional LDL-C lowering potential: statin (~30-40%) vs PCSK9i (~50-60%) vs nexlizet (~20%) and their adverse effects  Plan: Start atorvastatin 16m QD +continuing zetia 164mdaily Monitor for signs/symptoms of myalgias, headaches Follow up fasting lipid panel in 6 weeks (10/08/2022) Increase physical activity, start moderate strenuous activity: have HR elevated; step goal (8,000-9,000 steps) Continue to stick with diet and eat fruits/vegetables for fiber benefit  Essential hypertension Assessment: Uses wrist cuff to check blood pressure Bp goal <130/80  Plan: Discussed proper technique using wrist cuff and taking blood pressure (be well rested, emptied bladder, feet flat on floor) Bring cuff and home readings to next appointment with Dr. RoHarrington Challenger2/18 for assessment  Thank you,  JaSandford CrazePharmD. [place] [facility] PGY-1 08/28/2022 11:01 AM   MeRamond DialPharm.D, BCPS, CPP [id number]. [address] [id number]: [phone]: [phone]

## 2022-09-24 ENCOUNTER — Other Ambulatory Visit: Payer: Self-pay | Admitting: *Deleted

## 2022-09-24 ENCOUNTER — Telehealth: Payer: Self-pay | Admitting: Family Medicine

## 2022-09-24 MED ORDER — OZEMPIC (1 MG/DOSE) 4 MG/3ML ~~LOC~~ SOPN: 1.0000 mg | PEN_INJECTOR | SUBCUTANEOUS | 2 refills | Status: DC

## 2022-09-24 NOTE — Telephone Encounter (Signed)
Ok to increase dose to '2mg'$  weekly. Please have her follow up with [place] soon.

## 2022-09-24 NOTE — Telephone Encounter (Signed)
Patient requesting increase on Rx Ozempic

## 2022-09-24 NOTE — Telephone Encounter (Signed)
   LAST APPOINTMENT DATE:   07/19/22 OV with PCP   NEXT APPOINTMENT DATE: 11/16/21 CPE   MEDICATION: Semaglutide, 1 MG/DOSE, (OZEMPIC, 1 MG/DOSE,) 4 MG/3ML SOPN [[id number]]    Is the patient out of medication?  Unknown   PHARMACY: [facility], [place] to Registered [address] [place], [place] [address] Phone: [phone]  Fax: [phone]

## 2022-09-24 NOTE — Telephone Encounter (Signed)
Rx send to [place]

## 2022-09-25 ENCOUNTER — Other Ambulatory Visit: Payer: Self-pay

## 2022-09-25 MED ORDER — SEMAGLUTIDE (2 MG/DOSE) 8 MG/3ML ~~LOC~~ SOPN: 2.0000 mg | PEN_INJECTOR | SUBCUTANEOUS | 2 refills | Status: DC

## 2022-09-25 MED ORDER — SEMAGLUTIDE (2 MG/DOSE) 8 MG/3ML ~~LOC~~ SOPN: 2.0000 mg | PEN_INJECTOR | SUBCUTANEOUS | 0 refills | Status: DC

## 2022-09-25 NOTE — Telephone Encounter (Signed)
2 mg sent to pharmacy

## 2022-09-27 ENCOUNTER — Other Ambulatory Visit: Payer: Self-pay

## 2022-09-28 ENCOUNTER — Ambulatory Visit
Admission: RE | Admit: 2022-09-28 | Discharge: 2022-09-28 | Disposition: A | Discharge status: Home / Self Care | Payer: Medicare Other | Source: Ambulatory Visit | Attending: Obstetrics and Gynecology | Admitting: Obstetrics and Gynecology

## 2022-09-28 DIAGNOSIS — Z139 Encounter for screening, unspecified: Secondary | ICD-10-CM

## 2022-10-08 ENCOUNTER — Ambulatory Visit: Payer: Medicare Other | Admitting: Internal Medicine

## 2022-10-08 ENCOUNTER — Telehealth: Payer: Self-pay

## 2022-10-08 ENCOUNTER — Ambulatory Visit: Payer: Medicare Other | Attending: Internal Medicine

## 2022-10-08 DIAGNOSIS — E785 Hyperlipidemia, unspecified: Secondary | ICD-10-CM

## 2022-10-08 NOTE — Telephone Encounter (Signed)
Left a message for the pt to call and rescheduled her appt with Dr Harrington Challenger today... I have moved her to an appt time for 10/26/22 and need to see if this is okay with her.

## 2022-10-09 LAB — HEPATIC FUNCTION PANEL
ALT: 5 IU/L (ref 0–32)
AST: 14 IU/L (ref 0–40)
Albumin: 4.4 g/dL (ref 3.9–4.9)
Alkaline Phosphatase: 91 IU/L (ref 44–121)
Bilirubin Total: 0.3 mg/dL (ref 0.0–1.2)
Bilirubin, Direct: 0.13 mg/dL (ref 0.00–0.40)
Total Protein: 7 g/dL (ref 6.0–8.5)

## 2022-10-09 LAB — APOLIPOPROTEIN B: Apolipoprotein B: 74 mg/dL (ref <90)

## 2022-10-09 LAB — LIPID PANEL
Chol/HDL Ratio: 2.2 ratio (ref 0.0–4.4)
Cholesterol, Total: 175 mg/dL (ref 100–199)
HDL: 81 mg/dL (ref >39)
LDL Chol Calc (NIH): 83 mg/dL (ref 0–99)
Triglycerides: 56 mg/dL (ref 0–149)
VLDL Cholesterol Cal: 11 mg/dL (ref 5–40)

## 2022-10-24 ENCOUNTER — Ambulatory Visit: Payer: Medicare Other | Admitting: Internal Medicine

## 2022-10-24 ENCOUNTER — Other Ambulatory Visit: Payer: Self-pay | Admitting: Family Medicine

## 2022-10-26 ENCOUNTER — Encounter: Payer: Self-pay | Admitting: Internal Medicine

## 2022-10-26 ENCOUNTER — Ambulatory Visit: Payer: Medicare Other | Attending: Internal Medicine | Admitting: Internal Medicine

## 2022-10-26 VITALS — BP 138/72 | HR 71 | Ht 59.0 in | Wt 212.0 lb

## 2022-10-26 DIAGNOSIS — R0989 Other specified symptoms and signs involving the circulatory and respiratory systems: Secondary | ICD-10-CM | POA: Insufficient documentation

## 2022-10-26 DIAGNOSIS — E785 Hyperlipidemia, unspecified: Secondary | ICD-10-CM | POA: Diagnosis not present

## 2022-10-26 NOTE — Progress Notes (Signed)
Cardiology Office Note   Date:  10/26/2022   ID:  Susan [name], DOB [birth year], MRN [id number]  PCP:  Vivi Barrack, MD  Cardiologist:   Dorris Carnes, MD   Pt presents for follow up of HTN       History of Present Illness: Susan [name] is a 67 y.o. female with a history of HTN, HL CP and atherosclerosis   I saw her for the first time in October 2023   CP atypical      Labs showed LDL 134  Apo B 101 and Lpa 67   I  reocmm starting Repatha since she was intolerant to statins    BP was elevated on first visit but good on visit to Hughie Closs  Since seen she denies CP   Breathing is OK    BP at home 138/70s to 80    She is very reluctant to change meds   Currently working with dermatology for hair growth  Wearing a cap at night      Says she had hair loss with amlodioine       Current Meds  Medication Sig   albuterol (PROAIR HFA) 108 (90 Base) MCG/ACT inhaler Inhale 2 puffs into the lungs every 6 (six) hours as needed for wheezing or shortness of breath.   atorvastatin (LIPITOR) 10 MG tablet Take 1 tablet (10 mg total) by mouth daily.   cholecalciferol (VITAMIN D3) 25 MCG (1000 UNIT) tablet Take 3,000 Units by mouth daily.   diclofenac Sodium (VOLTAREN) 1 % GEL Apply 4 g to affected area up to 4 times daily.   EPINEPHrine 0.3 mg/0.3 mL IJ SOAJ injection Inject 0.3 mg into the muscle once as needed (anaphylaxis/allergic reaction).   ezetimibe (ZETIA) 10 MG tablet TAKE ONE TABLET BY MOUTH ONE TIME DAILY   fluocinonide ointment (LIDEX) 7.68 % Apply 1 Application topically 2 (two) times daily.   orphenadrine (NORFLEX) 100 MG tablet Take 100 mg by mouth as needed for muscle spasms.    Semaglutide, 2 MG/DOSE, 8 MG/3ML SOPN Inject 2 mg as directed once a week.   spironolactone (ALDACTONE) 25 MG tablet TAKE ONE TABLET BY MOUTH ONE TIME DAILY     Allergies:   Aspirin, Avelox [moxifloxacin hcl in nacl], Shellfish allergy, Bee venom, Cat hair extract, Ibuprofen, Sulfa antibiotics,  Bee pollen, Codeine, Crestor [rosuvastatin calcium], Penicillins, Pravastatin sodium [pravachol], and Simvastatin   Past Medical History:  Diagnosis Date   Allergy    Anemia    Anxiety state, unspecified    Back injury    HLD (hyperlipidemia)    intol of statins - follows with lipid clinic   HTN (hypertension)    Irritable bowel syndrome    Lumbosacral spondylosis without myelopathy    improved with PT   Morbid obesity ([place])     Past Surgical History:  Procedure Laterality Date   ABSCESS DRAINAGE     abdominal   CESAREAN SECTION  1977     Social History:  The patient  reports that she has never smoked. She has never used smokeless tobacco. She reports that she does not drink alcohol and does not use drugs.   Family History:  The patient's family history includes Breast cancer in her mother; Colon cancer in her maternal grandmother; Diabetes in her father and mother; Hypertension in her mother; Kidney failure in her father; Multiple myeloma in her mother.    ROS:  Please see the history of present illness. All other systems  are reviewed and  Negative to the above problem except as noted.    PHYSICAL EXAM: VS:  BP 138/72   Pulse 71   Ht '4\' 11"'$  (1.499 m)   Wt 212 lb (96.2 kg)   SpO2 92%   BMI 42.82 kg/m   GEN: Well nourished, well developed, in no acute distress  HEENT: normal  Neck: no JVD,  Cardiac: RRR; no murmur  No LE edema  Respiratory:  clear to auscultation bilaterally, GI: soft, nontender,  NO hepatomegaly   MS: no deformity Moving all extremities   Skin: warm and dry, no rash Neuro:  Strength and sensation are intact Psych: euthymic mood, full affect   EKG:  EKG is not ordered    Lipid Panel    Component Value Date/Time   CHOL 175 10/08/2022 0849   TRIG 56 10/08/2022 0849   HDL 81 10/08/2022 0849   CHOLHDL 2.2 10/08/2022 0849   CHOLHDL 3 11/15/2021 1019   VLDL 13.4 11/15/2021 1019   LDLCALC 83 10/08/2022 0849   LDLDIRECT 117.3 01/14/2013 0839       Wt Readings from Last 3 Encounters:  10/26/22 212 lb (96.2 kg)  08/16/22 213 lb 3.2 oz (96.7 kg)  07/19/22 213 lb 12.8 oz (97 kg)      ASSESSMENT AND PLAN:   1  HTN   BP is mildly increased    Follow   She is very concerned about hair   In a study now   Follow   Review meds     Keep track of BP  2Hx of CP   Pt denies    3 Atherosclerosois   Of aorta  Seen on CT scan  Rx risk factors  Will get carotid USN    4  HL   On Zetia  and now lipitor    Lipid panel ordered.     Current medicines are reviewed at length with the patient today.  The patient does not have concerns regarding medicines.  Signed, Dorris Carnes, MD  10/26/2022 11:32 AM    [facility] [place], [place], [place]  [address] Phone: [phone]; Fax: [phone]

## 2022-10-26 NOTE — Patient Instructions (Signed)
Medication Instructions:   *If you need a refill on your cardiac medications before your next appointment, please call your pharmacy*   Lab Work:  If you have labs (blood work) drawn today and your tests are completely normal, you will receive your results only by: [place] (if you have MyChart) OR A paper copy in the mail If you have any lab test that is abnormal or we need to change your treatment, we will call you to review the results.   Testing/Procedures: Your physician has requested that you have a carotid duplex. This test is an ultrasound of the carotid arteries in your neck. It looks at blood flow through these arteries that supply the brain with blood. Allow one hour for this exam. There are no restrictions or special instructions.    Follow-Up: At [facility], you and your health needs are our priority.  As part of our continuing mission to provide you with exceptional heart care, we have created designated Provider Care Teams.  These Care Teams include your primary Cardiologist (physician) and Advanced Practice Providers (APPs -  Physician Assistants and Nurse Practitioners) who all work together to provide you with the care you need, when you need it.  We recommend signing up for the patient portal called "MyChart".  Sign up information is provided on this After Visit Summary.  MyChart is used to connect with patients for Virtual Visits (Telemedicine).  Patients are able to view lab/test results, encounter notes, upcoming appointments, etc.  Non-urgent messages can be sent to your provider as well.   To learn more about what you can do with MyChart, go to [email or web address].     Important Information About Sugar

## 2022-11-06 ENCOUNTER — Ambulatory Visit (HOSPITAL_COMMUNITY)
Admission: RE | Admit: 2022-11-06 | Discharge: 2022-11-06 | Disposition: A | Discharge status: Home / Self Care | Payer: Medicare Other | Source: Ambulatory Visit | Attending: Cardiology | Admitting: Cardiology

## 2022-11-06 DIAGNOSIS — E785 Hyperlipidemia, unspecified: Secondary | ICD-10-CM | POA: Insufficient documentation

## 2022-11-06 DIAGNOSIS — R0989 Other specified symptoms and signs involving the circulatory and respiratory systems: Secondary | ICD-10-CM | POA: Insufficient documentation

## 2022-11-07 ENCOUNTER — Other Ambulatory Visit: Payer: Self-pay | Admitting: Family Medicine

## 2022-11-07 ENCOUNTER — Telehealth: Payer: Self-pay | Admitting: Family Medicine

## 2022-11-07 NOTE — Telephone Encounter (Signed)
Please advise 

## 2022-11-07 NOTE — Telephone Encounter (Signed)
Pt states she is getting very constipated from taking  Semaglutide, 2 MG/DOSE, 8 MG/3ML SOPN  And wants to know what to do. Please advise.

## 2022-11-08 NOTE — Telephone Encounter (Signed)
She can try using daily miralax to have at least 1 soft bowel movement daily. If still having issues then we will need to decrease the dose.  Algis Greenhouse. Jerline Pain, MD 11/08/2022 7:32 AM

## 2022-11-08 NOTE — Telephone Encounter (Signed)
Patient requests to be called at ph# [phone] asap to be advised if it is okay for Patient to stop taking Ozempic due to side effects.

## 2022-11-08 NOTE — Telephone Encounter (Signed)
Left message to return call to our office at their convenience.

## 2022-11-08 NOTE — Telephone Encounter (Signed)
Patient returned call. Requests to be called. States Phone will be off between 2pm and 2:30 pm today (will be at an appointment)

## 2022-11-08 NOTE — Telephone Encounter (Signed)
Spoke with patient notified Ok per Dr Jerline Pain to stop Ozempic  Stated Mirilax does not work for her, taking Dulcolax

## 2022-11-16 ENCOUNTER — Ambulatory Visit (INDEPENDENT_AMBULATORY_CARE_PROVIDER_SITE_OTHER): Payer: Medicare Other | Admitting: Family Medicine

## 2022-11-16 ENCOUNTER — Encounter: Payer: Self-pay | Admitting: Family Medicine

## 2022-11-16 VITALS — BP 128/78 | HR 69 | Temp 98.7°F | Ht 59.0 in | Wt 202.8 lb

## 2022-11-16 DIAGNOSIS — R350 Frequency of micturition: Secondary | ICD-10-CM | POA: Diagnosis not present

## 2022-11-16 DIAGNOSIS — K5900 Constipation, unspecified: Secondary | ICD-10-CM

## 2022-11-16 DIAGNOSIS — Z0001 Encounter for general adult medical examination with abnormal findings: Secondary | ICD-10-CM | POA: Diagnosis not present

## 2022-11-16 DIAGNOSIS — E785 Hyperlipidemia, unspecified: Secondary | ICD-10-CM | POA: Diagnosis not present

## 2022-11-16 DIAGNOSIS — M810 Age-related osteoporosis without current pathological fracture: Secondary | ICD-10-CM

## 2022-11-16 DIAGNOSIS — R739 Hyperglycemia, unspecified: Secondary | ICD-10-CM

## 2022-11-16 DIAGNOSIS — I10 Essential (primary) hypertension: Secondary | ICD-10-CM | POA: Diagnosis not present

## 2022-11-16 LAB — IBC + FERRITIN
Ferritin: 134 ng/mL (ref 10.0–291.0)
Iron: 62 ug/dL (ref 42–145)
Saturation Ratios: 18.6 % — ABNORMAL LOW (ref 20.0–50.0)
TIBC: 333.2 ug/dL (ref 250.0–450.0)
Transferrin: 238 mg/dL (ref 212.0–360.0)

## 2022-11-16 LAB — COMPREHENSIVE METABOLIC PANEL
ALT: 7 U/L (ref 0–35)
AST: 14 U/L (ref 0–37)
Albumin: 4.4 g/dL (ref 3.5–5.2)
Alkaline Phosphatase: 82 U/L (ref 39–117)
BUN: 16 mg/dL (ref 6–23)
CO2: 25 mEq/L (ref 19–32)
Calcium: 9.6 mg/dL (ref 8.4–10.5)
Chloride: 105 mEq/L (ref 96–112)
Creatinine, Ser: 0.66 mg/dL (ref 0.40–1.20)
GFR: 90.41 mL/min (ref >60.00)
Glucose, Bld: 79 mg/dL (ref 70–99)
Potassium: 4.4 mEq/L (ref 3.5–5.1)
Sodium: 140 mEq/L (ref 135–145)
Total Bilirubin: 0.4 mg/dL (ref 0.2–1.2)
Total Protein: 7.4 g/dL (ref 6.0–8.3)

## 2022-11-16 LAB — URINALYSIS, ROUTINE W REFLEX MICROSCOPIC
Ketones, ur: 15 — AB
Nitrite: NEGATIVE
Specific Gravity, Urine: >=1.03 — AB (ref 1.000–1.030)
Urine Glucose: NEGATIVE
Urobilinogen, UA: 0.2 (ref 0.0–1.0)
pH: 6 (ref 5.0–8.0)

## 2022-11-16 LAB — CBC
HCT: 39.6 % (ref 36.0–46.0)
Hemoglobin: 13.2 g/dL (ref 12.0–15.0)
MCHC: 33.3 g/dL (ref 30.0–36.0)
MCV: 92.4 fl (ref 78.0–100.0)
Platelets: 343 10*3/uL (ref 150.0–400.0)
RBC: 4.29 Mil/uL (ref 3.87–5.11)
RDW: 13.7 % (ref 11.5–15.5)
WBC: 7.2 10*3/uL (ref 4.0–10.5)

## 2022-11-16 LAB — TSH: TSH: 0.82 u[IU]/mL (ref 0.35–5.50)

## 2022-11-16 LAB — VITAMIN D 25 HYDROXY (VIT D DEFICIENCY, FRACTURES): VITD: 41.07 ng/mL (ref 30.00–100.00)

## 2022-11-16 LAB — HEMOGLOBIN A1C: Hgb A1c MFr Bld: 5.9 % (ref 4.6–6.5)

## 2022-11-16 NOTE — Assessment & Plan Note (Signed)
She is down about 30 pounds over the past year.  Congratulated patient on weight loss.  Unfortunately is having quite a bit of side effects with Ozempic and we will be stopping today.  She will continue to work on diet and exercise.  May consider starting [facility] in a few weeks if she is doing well.  Hopefully she will have less side effects with this.

## 2022-11-16 NOTE — Patient Instructions (Addendum)
It was very nice to see you today!  Will check blood work today.  Please stop the Ozempic.  We will check a urine sample today to make sure you do not have a UTI.  We will order your bone density scan.  You are due for your colonoscopy later this year.  Please let me know how you are doing in a few weeks and we can try Mounjaro.  Will probably see you back in 3 to 6 months after starting this.  Will see you back in year for your next physical.  Please come back to see [place] sooner if needed.  Take care, Dr Jerline Pain  PLEASE NOTE:  If you had any lab tests, please let us know if you have not heard back within a few days. You may see your results on mychart before we have a chance to review them but we will give you a call once they are reviewed by [place].   If we ordered any referrals today, please let us know if you have not heard from their office within the next week.   If you had any urgent prescriptions sent in today, please check with the pharmacy within an hour of our visit to make sure the prescription was transmitted appropriately.   Please try these tips to maintain a healthy lifestyle:  Eat at least 3 REAL meals and 1-2 snacks per day.  Aim for no more than 5 hours between eating.  If you eat breakfast, please do so within one hour of getting up.   Each meal should contain half fruits/vegetables, one quarter protein, and one quarter carbs (no bigger than a computer mouse)  Cut down on sweet beverages. This includes juice, soda, and sweet tea.   Drink at least 1 glass of water with each meal and aim for at least 8 glasses per day  Exercise at least 150 minutes every week.

## 2022-11-16 NOTE — Assessment & Plan Note (Signed)
She is currently on Ozempic but is having some issues with constipation due to this.  She would like to stop completely.  She has had good results with A1c and weight loss with this however side effects are too bothersome at this point.  We did discuss decreasing the dose however she would prefer to stop completely.  Think this is reasonable.  She will send a message in a few weeks to let me know how she is doing.  May consider starting [facility] if she is doing well.  She will follow-up me in a few weeks via MyChart.  Will check A1c today and will probably have her come back for the recheck on A1c in 3 to 6 months.

## 2022-11-16 NOTE — Progress Notes (Signed)
Chief Complaint:  Susan [name] is a 67 y.o. female who presents today for her annual comprehensive physical exam.    Assessment/Plan:  New/Acute Problems: Urinary Frequency  No red flags.  Will check UA and urine culture.  Potentially could be secondary to OAB.  We did potentially discuss starting medications to help with bladder spasms however she deferred for now.  Will treat culture if positive.  May consider referral to urology if above workup is negative.  Chronic Problems Addressed Today: Hyperglycemia She is currently on Ozempic but is having some issues with constipation due to this.  She would like to stop completely.  She has had good results with A1c and weight loss with this however side effects are too bothersome at this point.  We did discuss decreasing the dose however she would prefer to stop completely.  Think this is reasonable.  She will send a message in a few weeks to let me know how she is doing.  May consider starting [facility] if she is doing well.  She will follow-up me in a few weeks via MyChart.  Will check A1c today and will probably have her come back for the recheck on A1c in 3 to 6 months.  Dyslipidemia Follows with cardiology for this.  Recently had lipids.  Would not recheck today.  Continue Lipitor 10 mg daily and Zetia 10 mg daily.  Morbid obesity ([place]) She is down about 30 pounds over the past year.  Congratulated patient on weight loss.  Unfortunately is having quite a bit of side effects with Ozempic and we will be stopping today.  She will continue to work on diet and exercise.  May consider starting [facility] in a few weeks if she is doing well.  Hopefully she will have less side effects with this.  Essential hypertension Blood pressure at goal today on spironolactone 25 mg daily  Constipation Symptoms have worsened since going up on the dose of Ozempic.  We will be stopping today as above.  She can continue using MiraLAX as  needed.  Osteoporosis with last DEXA 02/2020 Check vitamin D.  Continue calcium and vitamin D supplementation.  Will repeat DEXA scan.    Preventative Healthcare: Check labs. Up to date on vaccines. Colonscopy due later this year.   Patient Counseling(The following topics were reviewed and/or handout was given):  -Nutrition: Stressed importance of moderation in sodium/caffeine intake, saturated fat and cholesterol, caloric balance, sufficient intake of fresh fruits, vegetables, and fiber.  -Stressed the importance of regular exercise.   -Substance Abuse: Discussed cessation/primary prevention of tobacco, alcohol, or other drug use; driving or other dangerous activities under the influence; availability of treatment for abuse.   -Injury prevention: Discussed safety belts, safety helmets, smoke detector, smoking near bedding or upholstery.   -Sexuality: Discussed sexually transmitted diseases, partner selection, use of condoms, avoidance of unintended pregnancy and contraceptive alternatives.   -Dental health: Discussed importance of regular tooth brushing, flossing, and dental visits.  -Health maintenance and immunizations reviewed. Please refer to Health maintenance section.  Return to care in 1 year for next preventative visit.     Subjective:  HPI:  She has no acute complaints today. See A/p for status of chronic conditions.   She has had some increased urinary frequency for a couple of months. No dysuria. Sometimes gets a sudden urge.  No back pain.  No fevers or chills.  Lifestyle Diet: Balanced. Plenty of fruits and vegetables.  Exercise: Going to gym routinely. Working  on calisthenics.      11/16/2022    8:59 AM  Depression screen PHQ 2/9  Decreased Interest 0  Down, Depressed, Hopeless 0  PHQ - 2 Score 0    Health Maintenance Due  Topic Date Due   COVID-19 Vaccine (6 - 2023-24 season) 06/22/2022     ROS: Per HPI, otherwise a complete review of systems was negative.    PMH:  The following were reviewed and entered/updated in epic: Past Medical History:  Diagnosis Date   Allergy    Anemia    Anxiety state, unspecified    Back injury    HLD (hyperlipidemia)    intol of statins - follows with lipid clinic   HTN (hypertension)    Irritable bowel syndrome    Lumbosacral spondylosis without myelopathy    improved with PT   Morbid obesity ([place])    Patient Active Problem List   Diagnosis Date Noted   Allergic rhinitis 02/23/2022   Bee sting allergy 02/23/2022   Constipation 02/23/2022   Hyperglycemia 11/15/2021   Fibroids 01/30/2021   Closed nondisplaced fracture of distal phalanx of right thumb 10/19/2020   Osteoporosis with last DEXA 02/2020 03/15/2020   Alopecia 08/21/2019   Hemorrhoid 08/21/2019   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   Skin inflammation 04/21/2015   VENOUS INSUFFICIENCY 08/18/2010   SPONDYLOSIS, LUMBAR 01/17/2009   Dyslipidemia 11/26/2007   Morbid obesity ([place]) 11/26/2007   Essential hypertension 11/25/2007   Past Surgical History:  Procedure Laterality Date   ABSCESS DRAINAGE     abdominal   CESAREAN SECTION  1977    Family History  Problem Relation Age of Onset   Breast cancer Mother    Diabetes Mother    Hypertension Mother    Multiple myeloma Mother    Diabetes Father        on dialysis   Kidney failure Father    Colon cancer Maternal Grandmother     Medications- reviewed and updated Current Outpatient Medications  Medication Sig Dispense Refill   albuterol (PROAIR HFA) 108 (90 Base) MCG/ACT inhaler Inhale 2 puffs into the lungs every 6 (six) hours as needed for wheezing or shortness of breath. 1 each 0   atorvastatin (LIPITOR) 10 MG tablet Take 1 tablet (10 mg total) by mouth daily. 90 tablet 3   cholecalciferol (VITAMIN D3) 25 MCG (1000 UNIT) tablet Take 3,000 Units by mouth daily.     diclofenac Sodium (VOLTAREN) 1 % GEL Apply 4 g to affected area up to 4 times daily.     EPINEPHrine 0.3 mg/0.3 mL IJ SOAJ  injection Inject 0.3 mg into the muscle once as needed (anaphylaxis/allergic reaction). 2 each 0   ezetimibe (ZETIA) 10 MG tablet TAKE ONE TABLET BY MOUTH ONE TIME DAILY 90 tablet 0   Fluocinolone Acetonide Body 0.01 % OIL APPLY TOPICALLY TO SCALP THREE TIMES PER WEEK 118.28 mL 0   fluocinonide ointment (LIDEX) 9.76 % Apply 1 Application topically 2 (two) times daily.     orphenadrine (NORFLEX) 100 MG tablet Take 100 mg by mouth as needed for muscle spasms.      spironolactone (ALDACTONE) 25 MG tablet TAKE ONE TABLET BY MOUTH ONE TIME DAILY 90 tablet 0   docusate sodium (COLACE) 100 MG capsule Take 100 mg by mouth daily. (Patient not taking: Reported on 11/16/2022)     No current facility-administered medications for this visit.    Allergies-reviewed and updated Allergies  Allergen Reactions   Aspirin     Other reaction(s): Bleeding  Avelox [Moxifloxacin Hcl In Nacl] Anaphylaxis    Pt c/o swelling of her throat and tongue   Shellfish Allergy Anaphylaxis and Hives    Rash and itching Rash and itching   Bee Venom Hives   Cat Hair Extract Other (See Comments)   Ibuprofen Other (See Comments)    Gastric bleeding Gastric bleeding   Sulfa Antibiotics Hives   Bee Pollen     Other Reaction(s): Angioedema (ALLERGY/intolerance)   Codeine Other (See Comments)    REACTION: causes her heart to race   Crestor [Rosuvastatin Calcium]     Pt states INTOLERANT w/ muscle cramps   Penicillins Hives    REACTION: rash   Pravastatin Sodium [Pravachol]     REACTION: intolerant= headache & insomnia   Simvastatin     REACTION: intolerant= headache & insomnia    Social History   Socioeconomic History   Marital status: Divorced    Spouse name: Not on file   Number of children: 1   Years of education: college   Highest education level: Not on file  Occupational History   Occupation: Retired    Comment: Restaurant manager, 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   Tobacco Use   Smoking  status: Never   Smokeless tobacco: Never  Vaping Use   Vaping Use: Never used  Substance and Sexual Activity   Alcohol use: No   Drug use: No   Sexual activity: Not Currently  Other Topics Concern   Not on file  Social History Narrative   Lives alone, divorced since 65   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, clinical (histocompatibility and immunogenetics)   Social Determinants of Health   Financial Resource Strain: Low Risk  (07/16/2022)   Overall Financial Resource Strain (CARDIA)    Difficulty of Paying Living Expenses: Not hard at all  Food Insecurity: No Food Insecurity (07/16/2022)   Hunger Vital Sign    Worried About Running Out of Food in the Last Year: Never true    Ran Out of Food in the Last Year: Never true  Transportation Needs: No Transportation Needs (07/16/2022)   PRAPARE - Hydrologist (Medical): No    Lack of Transportation (Non-Medical): No  Physical Activity: Inactive (07/16/2022)   Exercise Vital Sign    Days of Exercise per Week: 0 days    Minutes of Exercise per Session: 0 min  Stress: No Stress Concern Present (07/16/2022)   [place]    Feeling of Stress : Only a little  Social Connections: Socially Isolated (07/16/2022)   Social Connection and Isolation Panel [NHANES]    Frequency of Communication with Friends and Family: Once a week    Frequency of Social Gatherings with Friends and Family: Once a week    Attends Religious Services: Never    Marine scientist or Organizations: No    Attends Music therapist: Never    Marital Status: Divorced        Objective:  Physical Exam: BP 128/78   Pulse 69   Temp 98.7 F (37.1 C) (Temporal)   Ht '4\' 11"'$  (1.499 m)   Wt 202 lb 12.8 oz (92 kg)   SpO2 98%   BMI 40.96 kg/m   Body mass index is 40.96 kg/m. Wt Readings from Last 3 Encounters:  11/16/22 202 lb 12.8 oz (92 kg)  10/26/22 212 lb (96.2 kg)  08/16/22 213 lb  3.2 oz (96.7 kg)    Gen: NAD, resting comfortably HEENT: TMs normal bilaterally. OP clear. No thyromegaly noted.  CV: RRR with no murmurs appreciated Pulm: NWOB, CTAB with no crackles, wheezes, or rhonchi GI: Normal bowel sounds present. Soft, Nontender, Nondistended. MSK: no edema, cyanosis, or clubbing noted Skin: warm, dry Neuro: CN2-12 grossly intact. Strength 5/5 in upper and lower extremities. Reflexes symmetric and intact bilaterally.  Psych: Normal affect and thought content     Rondell Frick M. Jerline Pain, MD 11/16/2022 9:53 AM

## 2022-11-16 NOTE — Assessment & Plan Note (Signed)
Check vitamin D.  Continue calcium and vitamin D supplementation.  Will repeat DEXA scan.

## 2022-11-16 NOTE — Assessment & Plan Note (Signed)
Follows with cardiology for this.  Recently had lipids.  Would not recheck today.  Continue Lipitor 10 mg daily and Zetia 10 mg daily.

## 2022-11-16 NOTE — Assessment & Plan Note (Signed)
Blood pressure at goal today on spironolactone 25 mg daily. 

## 2022-11-16 NOTE — Assessment & Plan Note (Signed)
Symptoms have worsened since going up on the dose of Ozempic.  We will be stopping today as above.  She can continue using MiraLAX as needed.

## 2022-11-17 LAB — URINE CULTURE
MICRO NUMBER:: 14479010
SPECIMEN QUALITY:: ADEQUATE

## 2022-11-19 NOTE — Progress Notes (Signed)
Please inform patient of the following:  Her urine culture did not have any signs of infection however she did have some protein and blood in her urine.  She could have some inflammation in her bladder or overactive bladder.  Recommend referral to urology as we discussed at her office visit.  Her blood sugar and the rest of her labs are all stable compared to previous values.  We should see her back in about 3 to 6 months to recheck her A1c.  Would like for her to let us know if she would like to start on mounjaro as we discussed at her office visit.

## 2022-11-21 ENCOUNTER — Other Ambulatory Visit: Payer: Self-pay | Admitting: *Deleted

## 2022-11-21 DIAGNOSIS — R319 Hematuria, unspecified: Secondary | ICD-10-CM

## 2022-11-27 ENCOUNTER — Ambulatory Visit (HOSPITAL_BASED_OUTPATIENT_CLINIC_OR_DEPARTMENT_OTHER)
Admission: RE | Admit: 2022-11-27 | Discharge: 2022-11-27 | Disposition: A | Discharge status: Home / Self Care | Payer: Medicare Other | Source: Ambulatory Visit | Attending: Family Medicine | Admitting: Family Medicine

## 2022-11-27 DIAGNOSIS — M85852 Other specified disorders of bone density and structure, left thigh: Secondary | ICD-10-CM | POA: Diagnosis not present

## 2022-11-27 DIAGNOSIS — I10 Essential (primary) hypertension: Secondary | ICD-10-CM

## 2022-11-27 DIAGNOSIS — Z780 Asymptomatic menopausal state: Secondary | ICD-10-CM | POA: Diagnosis not present

## 2022-11-27 DIAGNOSIS — M810 Age-related osteoporosis without current pathological fracture: Secondary | ICD-10-CM | POA: Insufficient documentation

## 2022-11-27 DIAGNOSIS — Z0001 Encounter for general adult medical examination with abnormal findings: Secondary | ICD-10-CM | POA: Diagnosis not present

## 2022-11-29 ENCOUNTER — Telehealth: Payer: Self-pay | Admitting: Family Medicine

## 2022-11-29 NOTE — Progress Notes (Signed)
Please inform patient of the following:  Her bone density scan shows that she has thinning of the bones called osteopenia.  Her numbers have improved since her last scan a couple of years ago.  She should continue her calcium and D vitamin supplementation and we can recheck again in 2 years.

## 2022-11-29 NOTE — Telephone Encounter (Signed)
Patient returned call. Requests to be called. 

## 2022-11-30 ENCOUNTER — Ambulatory Visit (INDEPENDENT_AMBULATORY_CARE_PROVIDER_SITE_OTHER): Payer: Medicare Other | Admitting: Family Medicine

## 2022-11-30 VITALS — BP 132/79 | HR 72 | Temp 97.3°F | Ht 59.0 in | Wt 204.0 lb

## 2022-11-30 DIAGNOSIS — J020 Streptococcal pharyngitis: Secondary | ICD-10-CM | POA: Diagnosis not present

## 2022-11-30 DIAGNOSIS — M810 Age-related osteoporosis without current pathological fracture: Secondary | ICD-10-CM | POA: Diagnosis not present

## 2022-11-30 DIAGNOSIS — I10 Essential (primary) hypertension: Secondary | ICD-10-CM | POA: Diagnosis not present

## 2022-11-30 LAB — POCT RAPID STREP A (OFFICE): Rapid Strep A Screen: NEGATIVE

## 2022-11-30 LAB — POC COVID19 BINAXNOW: SARS Coronavirus 2 Ag: NEGATIVE

## 2022-11-30 MED ORDER — PROMETHAZINE-DM 6.25-15 MG/5ML PO SYRP: 5.0000 mL | ORAL_SOLUTION | Freq: Four times a day (QID) | ORAL | 0 refills | Status: DC | PRN

## 2022-11-30 MED ORDER — AZELASTINE HCL 0.1 % NA SOLN: 2.0000 | Freq: Two times a day (BID) | NASAL | 12 refills | Status: DC

## 2022-11-30 MED ORDER — AZITHROMYCIN 250 MG PO TABS: ORAL_TABLET | ORAL | 0 refills | Status: DC

## 2022-11-30 NOTE — Progress Notes (Signed)
   Susan [name] is a 67 y.o. female who presents today for an office visit.  Assessment/Plan:  New/Acute Problems: Sinusitis  No red flags likely viral URI.  Will start Astelin nasal spray.  Will also send in promethazine-dextromethorphan cough syrup.  Will send Susan [name] prescription for azithromycin with instruction to not start unless symptoms or not improving over the next several days.  Current hydration.  She can use over-the-counter meds as needed.  We discussed reasons to return to care.  Follow-up as needed.  Chronic Problems Addressed Today: Essential hypertension Blood pressure at goal today on spironolactone 25 mg daily.  Osteoporosis with last DEXA 02/2020 Most recent bone density scan showed improvement and she is now in osteopenic range.  We discussed calcium and vitamin D supplementation.  We can recheck DEXA in 2 years.     Subjective:  HPI:  See A/p for status of chronic conditions.   Main concern today is cough and sore throat. This started a couple of days ago. No known sick contacts. Tried taking left over tessalon without much improvement. No fevers or chills. No shortness of breath. No chest pain. Cough is non productive. Some drainage.        Objective:  Physical Exam: BP 132/79   Pulse 72   Temp (!) 97.3 F (36.3 C) (Temporal)   Ht '4\' 11"'$  (1.499 m)   Wt 204 lb (92.5 kg)   SpO2 99%   BMI 41.20 kg/m   Gen: No acute distress, resting comfortably HEENT: TMs clear.  OP erythematous.  Nasal [place] erythematous and boggy bilaterally. CV: Regular rate and rhythm with no murmurs appreciated Pulm: Normal work of breathing, clear to auscultation bilaterally with no crackles, wheezes, or rhonchi Neuro: Grossly normal, moves all extremities Psych: Normal affect and thought content      Susan [name] M. Jerline Pain, MD 11/30/2022 2:54 PM

## 2022-11-30 NOTE — Assessment & Plan Note (Signed)
Most recent bone density scan showed improvement and she is now in osteopenic range.  We discussed calcium and vitamin D supplementation.  We can recheck DEXA in 2 years.

## 2022-11-30 NOTE — Telephone Encounter (Signed)
See results note. 

## 2022-11-30 NOTE — Patient Instructions (Signed)
It was very nice to see you today!  I think you probably have a sinus infection.  Please start the nasal spray.  Use the cough medication.  Start the antibiotic if not improving.  Please make sure that you are taking 1200 mg of calcium daily.  Let me know if not proving in the next 1 to 2 weeks.  Take care, Dr Jerline Pain  PLEASE NOTE:  If you had any lab tests, please let us know if you have not heard back within a few days. You may see your results on mychart before we have a chance to review them but we will give you a call once they are reviewed by [place].   If we ordered any referrals today, please let us know if you have not heard from their office within the next week.   If you had any urgent prescriptions sent in today, please check with the pharmacy within an hour of our visit to make sure the prescription was transmitted appropriately.   Please try these tips to maintain a healthy lifestyle:  Eat at least 3 REAL meals and 1-2 snacks per day.  Aim for no more than 5 hours between eating.  If you eat breakfast, please do so within one hour of getting up.   Each meal should contain half fruits/vegetables, one quarter protein, and one quarter carbs (no bigger than a computer mouse)  Cut down on sweet beverages. This includes juice, soda, and sweet tea.   Drink at least 1 glass of water with each meal and aim for at least 8 glasses per day  Exercise at least 150 minutes every week.

## 2022-11-30 NOTE — Assessment & Plan Note (Signed)
Blood pressure at goal today on spironolactone 25 mg daily.

## 2022-12-03 ENCOUNTER — Ambulatory Visit (INDEPENDENT_AMBULATORY_CARE_PROVIDER_SITE_OTHER): Payer: Medicare Other | Admitting: Family

## 2022-12-03 ENCOUNTER — Encounter: Payer: Self-pay | Admitting: Family

## 2022-12-03 VITALS — BP 156/60 | HR 85 | Temp 97.8°F | Ht 59.0 in | Wt 207.6 lb

## 2022-12-03 DIAGNOSIS — R053 Chronic cough: Secondary | ICD-10-CM

## 2022-12-03 DIAGNOSIS — J4521 Mild intermittent asthma with (acute) exacerbation: Secondary | ICD-10-CM | POA: Diagnosis not present

## 2022-12-03 MED ORDER — METHYLPREDNISOLONE ACETATE 40 MG/ML IJ SUSP
40.0000 mg | Freq: Once | INTRAMUSCULAR | Status: AC
  Administered 2022-12-03: 40 mg via INTRAMUSCULAR

## 2022-12-03 MED ORDER — METHYLPREDNISOLONE ACETATE 40 MG/ML IJ SUSP: 60.0000 mg | Freq: Once | INTRAMUSCULAR | Status: DC

## 2022-12-03 MED ORDER — BENZONATATE 200 MG PO CAPS: 200.0000 mg | ORAL_CAPSULE | Freq: Three times a day (TID) | ORAL | 0 refills | Status: AC | PRN

## 2022-12-03 MED ORDER — ALBUTEROL SULFATE HFA 108 (90 BASE) MCG/ACT IN AERS: 2.0000 | INHALATION_SPRAY | Freq: Four times a day (QID) | RESPIRATORY_TRACT | 2 refills | Status: DC | PRN

## 2022-12-03 NOTE — Progress Notes (Signed)
Patient ID: Susan [name], female    DOB: 30-Aug-1956, 67 y.o.   MRN: [id number]  Chief Complaint  Patient presents with   Cough    HPI:      URI sx:  started last Wednesday with cough and sore throat, seen by PCP on Friday and given cough syrup, advised to restart Astelin nasal spray and given Zpack but advised to wait a few days to start, which she states she did start on Saturday. Reports cough is still bad, causing her SOB, has been using Albuterol, but ran out last night.   Assessment & Plan:  1. Persistent cough - giving steroid injection today, pt advised on use & SE. Also refilling Tessalon perles. Advised on increased water intake, use humidifier overnight.  - benzonatate (TESSALON) 200 MG capsule; Take 1 capsule (200 mg total) by mouth 3 (three) times daily as needed for up to 10 days for cough.  Dispense: 20 capsule; Refill: 0 - methylPREDNISolone acetate (DEPO-MEDROL) injection 60 mg  2. Mild intermittent asthma with acute exacerbation - given steroid injection, pt states this has helped her breathing in the past. Also refilling rescue inhaler, advised to use this tid for the next few days or until cough has subsided. Advised to increase water intake, use humidifier overnight.  - albuterol (PROAIR HFA) 108 (90 Base) MCG/ACT inhaler; Inhale 2 puffs into the lungs every 6 (six) hours as needed for wheezing or shortness of breath.  Dispense: 1 each; Refill: 2 - methylPREDNISolone acetate (DEPO-MEDROL) injection 60 mg  Subjective:    Outpatient Medications Prior to Visit  Medication Sig Dispense Refill   albuterol (PROAIR HFA) 108 (90 Base) MCG/ACT inhaler Inhale 2 puffs into the lungs every 6 (six) hours as needed for wheezing or shortness of breath. 1 each 0   atorvastatin (LIPITOR) 10 MG tablet Take 1 tablet (10 mg total) by mouth daily. 90 tablet 3   azelastine (ASTELIN) 0.1 % nasal spray Place 2 sprays into both nostrils 2 (two) times daily. 30 mL 12   azithromycin  (ZITHROMAX) 250 MG tablet Take 2 tabs day 1, then 1 tab daily 6 each 0   cholecalciferol (VITAMIN D3) 25 MCG (1000 UNIT) tablet Take 3,000 Units by mouth daily.     diclofenac Sodium (VOLTAREN) 1 % GEL Apply 4 g to affected area up to 4 times daily.     docusate sodium (COLACE) 100 MG capsule Take 100 mg by mouth daily.     EPINEPHrine 0.3 mg/0.3 mL IJ SOAJ injection Inject 0.3 mg into the muscle once as needed (anaphylaxis/allergic reaction). 2 each 0   ezetimibe (ZETIA) 10 MG tablet TAKE ONE TABLET BY MOUTH ONE TIME DAILY 90 tablet 0   Fluocinolone Acetonide Body 0.01 % OIL APPLY TOPICALLY TO SCALP THREE TIMES PER WEEK 118.28 mL 0   fluocinonide ointment (LIDEX) [id number] % Apply 1 Application topically 2 (two) times daily.     orphenadrine (NORFLEX) 100 MG tablet Take 100 mg by mouth as needed for muscle spasms.      promethazine-dextromethorphan (PROMETHAZINE-DM) 6.25-15 MG/5ML syrup Take 5 mLs by mouth 4 (four) times daily as needed. 118 mL 0   spironolactone (ALDACTONE) 25 MG tablet TAKE ONE TABLET BY MOUTH ONE TIME DAILY 90 tablet 0   No facility-administered medications prior to visit.   Past Medical History:  Diagnosis Date   Allergy    Anemia    Anxiety state, unspecified    Back injury    HLD (hyperlipidemia)  intol of statins - follows with lipid clinic   HTN (hypertension)    Irritable bowel syndrome    Lumbosacral spondylosis without myelopathy    improved with PT   Morbid obesity ([facility])    Past Surgical History:  Procedure Laterality Date   ABSCESS DRAINAGE     abdominal   CESAREAN SECTION  1977   Allergies  Allergen Reactions   Aspirin     Other reaction(s): Bleeding   Avelox [Moxifloxacin Hcl In Nacl] Anaphylaxis    Pt c/o swelling of her throat and tongue   Shellfish Allergy Anaphylaxis and Hives    Rash and itching Rash and itching   Bee Venom Hives   Cat Hair Extract Other (See Comments)   Ibuprofen Other (See Comments)    Gastric bleeding Gastric  bleeding   Sulfa Antibiotics Hives   Bee Pollen     Other Reaction(s): Angioedema (ALLERGY/intolerance)   Codeine Other (See Comments)    REACTION: causes her heart to race   Crestor [Rosuvastatin Calcium]     Pt states INTOLERANT w/ muscle cramps   Penicillins Hives    REACTION: rash   Pravastatin Sodium [Pravachol]     REACTION: intolerant= headache & insomnia   Simvastatin     REACTION: intolerant= headache & insomnia      Objective:    Physical Exam Vitals and nursing note reviewed.  Constitutional:      Appearance: Normal appearance. She is not ill-appearing.     Interventions: Face mask in place.  HENT:     Right Ear: Tympanic membrane and ear canal normal.     Left Ear: Tympanic membrane and ear canal normal.     Nose:     Right Sinus: No frontal sinus tenderness.     Left Sinus: No frontal sinus tenderness.     Mouth/Throat:     Mouth: Mucous membranes are moist.     Pharynx: No pharyngeal swelling, oropharyngeal exudate, posterior oropharyngeal erythema or uvula swelling.     Tonsils: No tonsillar exudate or tonsillar abscesses.  Cardiovascular:     Rate and Rhythm: Normal rate and regular rhythm.  Pulmonary:     Effort: Pulmonary effort is normal.     Breath sounds: Examination of the right-upper field reveals wheezing. Examination of the left-upper field reveals wheezing. Examination of the right-middle field reveals wheezing. Examination of the left-middle field reveals wheezing. Wheezing present.  Musculoskeletal:        General: Normal range of motion.  Lymphadenopathy:     Head:     Right side of head: No preauricular or posterior auricular adenopathy.     Left side of head: No preauricular or posterior auricular adenopathy.     Cervical: No cervical adenopathy.  Skin:    General: Skin is warm and dry.  Neurological:     Mental Status: She is alert.  Psychiatric:        Mood and Affect: Mood normal.        Behavior: Behavior normal.    Ht 4' 11"$   (1.499 m)   BMI 41.20 kg/m  Wt Readings from Last 3 Encounters:  11/30/22 204 lb (92.5 kg)  11/16/22 202 lb 12.8 oz (92 kg)  10/26/22 212 lb (96.2 kg)      Jeanie Sewer, NP

## 2022-12-06 ENCOUNTER — Other Ambulatory Visit: Payer: Self-pay

## 2022-12-06 ENCOUNTER — Emergency Department (HOSPITAL_BASED_OUTPATIENT_CLINIC_OR_DEPARTMENT_OTHER): Payer: Medicare Other | Admitting: Radiology

## 2022-12-06 ENCOUNTER — Telehealth: Payer: Self-pay

## 2022-12-06 ENCOUNTER — Encounter (HOSPITAL_BASED_OUTPATIENT_CLINIC_OR_DEPARTMENT_OTHER): Payer: Self-pay

## 2022-12-06 ENCOUNTER — Emergency Department (HOSPITAL_BASED_OUTPATIENT_CLINIC_OR_DEPARTMENT_OTHER)
Admission: EM | Admit: 2022-12-06 | Discharge: 2022-12-06 | Disposition: A | Discharge status: Home / Self Care | Payer: Medicare Other | Attending: Emergency Medicine | Admitting: Emergency Medicine

## 2022-12-06 DIAGNOSIS — J069 Acute upper respiratory infection, unspecified: Secondary | ICD-10-CM | POA: Diagnosis not present

## 2022-12-06 DIAGNOSIS — R059 Cough, unspecified: Secondary | ICD-10-CM | POA: Diagnosis not present

## 2022-12-06 DIAGNOSIS — J0100 Acute maxillary sinusitis, unspecified: Secondary | ICD-10-CM | POA: Diagnosis not present

## 2022-12-06 DIAGNOSIS — Z1152 Encounter for screening for COVID-19: Secondary | ICD-10-CM | POA: Diagnosis not present

## 2022-12-06 DIAGNOSIS — J45901 Unspecified asthma with (acute) exacerbation: Secondary | ICD-10-CM | POA: Diagnosis not present

## 2022-12-06 DIAGNOSIS — J4521 Mild intermittent asthma with (acute) exacerbation: Secondary | ICD-10-CM | POA: Diagnosis not present

## 2022-12-06 DIAGNOSIS — J029 Acute pharyngitis, unspecified: Secondary | ICD-10-CM | POA: Diagnosis not present

## 2022-12-06 LAB — RESP PANEL BY RT-PCR (RSV, FLU A&B, COVID)  RVPGX2
Influenza A by PCR: NEGATIVE
Influenza B by PCR: NEGATIVE
Resp Syncytial Virus by PCR: NEGATIVE
SARS Coronavirus 2 by RT PCR: NEGATIVE

## 2022-12-06 MED ORDER — PREDNISONE 50 MG PO TABS
60.0000 mg | ORAL_TABLET | Freq: Once | ORAL | Status: AC
  Administered 2022-12-06: 60 mg via ORAL
  Filled 2022-12-06: qty 1

## 2022-12-06 MED ORDER — DOXYCYCLINE HYCLATE 100 MG PO CAPS: 100.0000 mg | ORAL_CAPSULE | Freq: Two times a day (BID) | ORAL | 0 refills | Status: AC

## 2022-12-06 MED ORDER — IPRATROPIUM-ALBUTEROL 0.5-2.5 (3) MG/3ML IN SOLN
3.0000 mL | RESPIRATORY_TRACT | Status: AC
  Administered 2022-12-06: 3 mL via RESPIRATORY_TRACT
  Filled 2022-12-06: qty 3

## 2022-12-06 MED ORDER — PREDNISONE 20 MG PO TABS: 40.0000 mg | ORAL_TABLET | Freq: Every day | ORAL | 0 refills | Status: AC

## 2022-12-06 NOTE — ED Notes (Signed)
Patient verbalizes understanding of discharge instructions. Opportunity for questioning and answers were provided. Patient discharged from ED.  °

## 2022-12-06 NOTE — Discharge Instructions (Addendum)
You were seen for your sinus infection and asthma exacerbation in the emergency department.   At home, please take the doxycycline we have prescribed you your inhaler, and the prednisone for your symptoms.    Check your MyChart online for the results of any tests that had not resulted by the time you left the emergency department.   Follow-up with your primary doctor in 2-3 days regarding your visit.    Return immediately to the emergency department if you experience any of the following: Difficulty breathing, or any other concerning symptoms.    Thank you for visiting our Emergency Department. It was a pleasure taking care of you today.

## 2022-12-06 NOTE — ED Triage Notes (Signed)
Patient here POV from Home.  Endorses Congestion, Productive Cough, Sore Throat. Symptoms since Friday (6 Days).   No Fever. IM Steroid received Monday was somewhat helpful.   NAD Noted during Triage. A&Ox4. GCS 15. Ambulatory.

## 2022-12-06 NOTE — Patient Outreach (Signed)
  Care Coordination   12/06/2022 Name: Susan [name] MRN: [id number] DOB: Mar 22, 1956   Care Coordination Outreach Attempts:  An unsuccessful telephone outreach was attempted today to offer the patient information about available care coordination services as a benefit of their health plan.   Follow Up Plan:  Additional outreach attempts will be made to offer the patient care coordination information and services.   Encounter Outcome:  No Answer   Care Coordination Interventions:  No, not indicated    Jone Baseman, RN, MSN [facility] Care Management Coordinator Direct Line [phone]

## 2022-12-06 NOTE — ED Provider Notes (Signed)
[name] Note   CSN: [id number] Arrival date & time: 12/06/22  1514     History  Chief Complaint  Patient presents with   Cough    Susan [name] is a 67 y.o. female.  67 year old female with a history of asthma who presents the emergency department with cough.  Patient reports that she started experiencing symptoms last Wednesday of a cough productive of white sputum with mild shortness of breath, chills, runny nose, and sore throat.  Has seen her primary doctor several times and has been on a course of azithromycin that did not improve her symptoms.  Also received Solu-Medrol 60 mg intramuscular injection on 10/02/2022 but has not been on any other steroids.  Has been trying Tessalon Perles for cough but they persisted so she came into the emergency department for evaluation.  Also reports some sinus pressure as well and nasal drainage.  Denies any chest pain, leg swelling, or fevers at home.       Home Medications Prior to Admission medications   Medication Sig Start Date End Date Taking? Authorizing Provider  doxycycline (VIBRAMYCIN) 100 MG capsule Take 1 capsule (100 mg total) by mouth 2 (two) times daily for 5 days. 12/06/22 12/11/22 Yes Fransico Meadow, MD  predniSONE (DELTASONE) 20 MG tablet Take 2 tablets (40 mg total) by mouth daily for 4 days. 12/06/22 12/10/22 Yes Fransico Meadow, MD  albuterol [facility] HFA) 108 [id number] Base) MCG/ACT inhaler Inhale 2 puffs into the lungs every 6 (six) hours as needed for wheezing or shortness of breath. 12/03/22   Jeanie Sewer, NP  atorvastatin (LIPITOR) 10 MG tablet Take 1 tablet (10 mg total) by mouth daily. 08/28/22   Fay Records, MD  azelastine (ASTELIN) 0.1 % nasal spray Place 2 sprays into both nostrils 2 (two) times daily. 11/30/22   Vivi Barrack, MD  azithromycin [facility]) 250 MG tablet Take 2 tabs day 1, then 1 tab daily 11/30/22   Vivi Barrack, MD  benzonatate  (TESSALON) 200 MG capsule Take 1 capsule (200 mg total) by mouth 3 (three) times daily as needed for up to 10 days for cough. 12/03/22 12/13/22  Jeanie Sewer, NP  cholecalciferol (VITAMIN D3) 25 MCG (1000 UNIT) tablet Take 3,000 Units by mouth daily.    [provider]  diclofenac Sodium (VOLTAREN) 1 % GEL Apply 4 g to affected area up to 4 times daily. 08/24/19   [provider]  docusate sodium (COLACE) 100 MG capsule Take 100 mg by mouth daily.    [provider]  EPINEPHrine 0.3 mg/0.3 mL IJ SOAJ injection Inject 0.3 mg into the muscle once as needed (anaphylaxis/allergic reaction). 02/23/22   Vivi Barrack, MD  ezetimibe (ZETIA) 10 MG tablet TAKE ONE TABLET BY MOUTH ONE TIME DAILY 11/08/22   Vivi Barrack, MD  Fluocinolone Acetonide Body 0.01 % OIL APPLY TOPICALLY TO SCALP THREE TIMES PER WEEK 06/30/22   Vivi Barrack, MD  fluocinonide ointment (LIDEX) [id number] % Apply 1 Application topically 2 (two) times daily.    [provider]  orphenadrine (NORFLEX) 100 MG tablet Take 100 mg by mouth as needed for muscle spasms.  01/21/19   [provider]  promethazine-dextromethorphan (PROMETHAZINE-DM) 6.25-15 MG/5ML syrup Take 5 mLs by mouth 4 (four) times daily as needed. 11/30/22   Vivi Barrack, MD  spironolactone (ALDACTONE) 25 MG tablet TAKE ONE TABLET BY MOUTH ONE TIME DAILY 10/24/22   Jerline Pain,  Algis Greenhouse, MD      Allergies    Aspirin, Avelox [moxifloxacin hcl in nacl], Shellfish allergy, Bee venom, Cat hair extract, Ibuprofen, Sulfa antibiotics, Bee pollen, Codeine, Crestor [rosuvastatin calcium], Penicillins, Pravastatin sodium [pravachol], and Simvastatin    Review of Systems   Review of Systems  Physical Exam Updated Vital Signs BP (!) 155/98 (BP Location: Right Arm)   Pulse 70   Temp 98.3 F (36.8 C) (Oral)   Resp 18   Ht 4' 11"$  (1.499 m)   Wt 92.5 kg   SpO2 98%   BMI 41.20 kg/m  Physical Exam Vitals and nursing note reviewed.   Constitutional:      General: She is not in acute distress.    Appearance: She is well-developed.  HENT:     Head: Normocephalic and atraumatic.     Comments: Right maxillary sinus tenderness to palpation    Right Ear: External ear normal.     Left Ear: External ear normal.     Nose: Congestion present.  Eyes:     Extraocular Movements: Extraocular movements intact.     Conjunctiva/sclera: Conjunctivae normal.     Pupils: Pupils are equal, round, and reactive to light.  Cardiovascular:     Rate and Rhythm: Normal rate and regular rhythm.     Heart sounds: No murmur heard. Pulmonary:     Effort: Pulmonary effort is normal. No respiratory distress.     Breath sounds: Wheezing (Diffuse expiratory mild) present.  Abdominal:     General: Abdomen is flat. There is no distension.     Palpations: Abdomen is soft. There is no mass.     Tenderness: There is no abdominal tenderness. There is no guarding.  Musculoskeletal:     Cervical back: Normal range of motion and neck supple.     Right lower leg: No edema.     Left lower leg: No edema.  Skin:    General: Skin is warm and dry.  Neurological:     Mental Status: She is alert and oriented to person, place, and time. Mental status is at baseline.  Psychiatric:        Mood and Affect: Mood normal.     ED Results / Procedures / Treatments   Labs (all labs ordered are listed, but only abnormal results are displayed) Labs Reviewed  RESP PANEL BY RT-PCR (RSV, FLU A&B, COVID)  [id number]    EKG None  Radiology No results found.  Procedures Procedures   Medications Ordered in ED Medications  ipratropium-albuterol (DUONEB) 0.5-2.5 (3) MG/3ML nebulizer solution 3 mL (3 mLs Nebulization Given 12/06/22 1701)  predniSONE (DELTASONE) tablet 60 mg (60 mg Oral Given 12/06/22 1640)    ED Course/ Medical Decision Making/ A&P                             Medical Decision Making Amount and/or Complexity of Data Reviewed Radiology:  ordered.  Risk Prescription drug management.   Susan [name] is a 67 y.o. female with comorbidities that complicate the patient evaluation including asthma who presents with URI symptoms  Initial Ddx:  URI, sinusitis, pneumonia, asthma exacerbation  MDM:  Feel the patient likely has a URI based on their symptoms.  Will obtain chest x-ray to evaluate for pneumonia.  With her duration of symptoms we will likely treat for sinusitis at this time.  Does appear to potentially be having a mild asthma exacerbation as well.  Plan:  COVID/flu Chest x-ray DuoNeb Prednisone  ED Summary/Re-evaluation:  Patient reevaluated in the emergency department and was improved after breathing treatment.  COVID/flu negative and chest x-ray was without signs of pneumonia.  Was given prescription of antibiotics to treat for sinusitis and prednisone for possible asthma exacerbation.  Feel that they are suitable for outpatient workup at this time so we will have them follow-up with their primary doctor in 2 to 3 days.  This patient presents to the ED for concern of complaints listed in HPI, this involves an extensive number of treatment options, and is a complaint that carries with it a high risk of complications and morbidity. Disposition including potential need for admission considered.   Dispo: DC Home. Return precautions discussed including, but not limited to, those listed in the AVS. Allowed pt time to ask questions which were answered fully prior to dc.  Records reviewed Outpatient Clinic Notes I independently reviewed the following imaging with scope of interpretation limited to determining acute life threatening conditions related to emergency care: Chest x-ray and agree with the radiologist interpretation with the following exceptions: None I personally reviewed and interpreted cardiac monitoring: normal sinus rhythm  I have reviewed the patients home medications and made adjustments as  needed   Final Clinical Impression(s) / ED Diagnoses Final diagnoses:  Upper respiratory tract infection, unspecified type  Acute maxillary sinusitis, recurrence not specified  Mild asthma with exacerbation, unspecified whether persistent    Rx / DC Orders ED Discharge Orders          Ordered    predniSONE (DELTASONE) 20 MG tablet  Daily        12/06/22 1655    doxycycline (VIBRAMYCIN) 100 MG capsule  2 times daily        12/06/22 1655              Fransico Meadow, MD 12/08/22 1555

## 2022-12-20 DIAGNOSIS — R3121 Asymptomatic microscopic hematuria: Secondary | ICD-10-CM | POA: Diagnosis not present

## 2022-12-20 DIAGNOSIS — N3281 Overactive bladder: Secondary | ICD-10-CM | POA: Diagnosis not present

## 2022-12-21 ENCOUNTER — Other Ambulatory Visit: Payer: Self-pay | Admitting: Family Medicine

## 2022-12-21 MED ORDER — EPINEPHRINE 0.3 MG/0.3ML IJ SOAJ: 0.3000 mg | Freq: Once | INTRAMUSCULAR | 0 refills | Status: DC | PRN

## 2023-01-20 ENCOUNTER — Other Ambulatory Visit: Payer: Self-pay | Admitting: Family Medicine

## 2023-02-01 ENCOUNTER — Other Ambulatory Visit: Payer: Self-pay | Admitting: Family Medicine

## 2023-02-04 ENCOUNTER — Encounter: Payer: Self-pay | Admitting: *Deleted

## 2023-02-11 ENCOUNTER — Other Ambulatory Visit: Payer: Self-pay | Admitting: Family

## 2023-02-11 DIAGNOSIS — J4521 Mild intermittent asthma with (acute) exacerbation: Secondary | ICD-10-CM

## 2023-02-13 NOTE — Progress Notes (Signed)
67 y.o. G37P1011 Divorced Black or [place] American Not Hispanic or Latino female here for annual exam.  No vaginal bleeding.   No bowel or bladder issues.     No LMP recorded. Patient is postmenopausal.          Sexually active: No.  The current method of family planning is post menopausal status.    Exercising: Yes.     Cardio  Smoker:  no  Health Maintenance: Pap:  01/2020 normal, no hpv testing History of abnormal Pap:  pap 12/02/12  negative+HPV, -16/18 f/u paps negative 2015, neg hpv testing. further paps only reflexed to hpv and no hpv testing done.  MMG:  10/01/22 density B Bi-rads 1 neg  BMD:   11/27/22  Osteopenia/ low bone mass  Colonoscopy: 2019 f/u 10 years  TDaP:  2017 Gardasil: n/a   reports that she has never smoked. She has never used smokeless tobacco. She reports that she does not drink alcohol and does not use drugs.  Retired. Daughter lives in [place], 2 grandchildren (20 and 15). Granddaughter is at .  Past Medical History:  Diagnosis Date   Allergy    Anemia    Anxiety state, unspecified    Back injury    HLD (hyperlipidemia)    intol of statins - follows with lipid clinic   HTN (hypertension)    Irritable bowel syndrome    Lumbosacral spondylosis without myelopathy    improved with PT   Morbid obesity ([facility])     Past Surgical History:  Procedure Laterality Date   ABSCESS DRAINAGE     abdominal   CESAREAN SECTION  1977    Current Outpatient Medications  Medication Sig Dispense Refill   albuterol (VENTOLIN HFA) 108 (90 Base) MCG/ACT inhaler inhale 2 puffs into the lungs by mouth every 6 hours as needed for wheezing or shortness of breath 8.5 g 0   atorvastatin (LIPITOR) 10 MG tablet Take 1 tablet (10 mg total) by mouth daily. 90 tablet 3   cholecalciferol (VITAMIN D3) 25 MCG (1000 UNIT) tablet Take 3,000 Units by mouth daily.     diclofenac Sodium (VOLTAREN) 1 % GEL Apply 4 g to affected area up to 4 times daily.     EPINEPHrine 0.3  mg/0.3 mL IJ SOAJ injection Inject 0.3 mg into the muscle once as needed (anaphylaxis/allergic reaction). 2 each 0   ezetimibe (ZETIA) 10 MG tablet TAKE ONE TABLET BY MOUTH ONE TIME DAILY 90 tablet 0   fluocinonide ointment (LIDEX) 0.05 % Apply 1 Application topically 2 (two) times daily.     orphenadrine (NORFLEX) 100 MG tablet Take 100 mg by mouth as needed for muscle spasms.     spironolactone (ALDACTONE) 25 MG tablet TAKE ONE TABLET BY MOUTH ONE TIME DAILY 90 tablet 0   CALCIUM PO Take 1 tablet by mouth once daily  Indications: osteopenia     No current facility-administered medications for this visit.    Family History  Problem Relation Age of Onset   Breast cancer Mother    Diabetes Mother    Hypertension Mother    Multiple myeloma Mother    Diabetes Father        on dialysis   Kidney failure Father    Colon cancer Maternal Grandmother     Review of Systems  Exam:   BP 122/74   Pulse 68   Ht 4' 11.5" (1.511 m)   Wt 210 lb (95.3 kg)   SpO2 100%   BMI  41.71 kg/m   Weight change: @WEIGHTCHANGE @ Height:   Height: 4' 11.5" (151.1 cm)  Ht Readings from Last 3 Encounters:  02/19/23 4' 11.5" (1.511 m)  02/18/23 4' 11.5" (1.511 m)  12/06/22 4\' 11"  (1.499 m)    General appearance: alert, cooperative and appears stated age Head: Normocephalic, without obvious abnormality, atraumatic Neck: no adenopathy, supple, symmetrical, trachea midline and thyroid normal to inspection and palpation Breasts: normal appearance, no masses or tenderness Abdomen: soft, non-tender; non distended,  no masses,  no organomegaly Extremities: extremities normal, atraumatic, no cyanosis or edema Skin: Skin color, texture, turgor normal. No rashes or lesions Lymph nodes: Cervical, supraclavicular, and axillary nodes normal. No abnormal inguinal nodes palpated Neurologic: Grossly normal   Pelvic: External genitalia:  no lesions              Urethra:  normal appearing urethra with no masses,  tenderness or lesions              Bartholins and Skenes: normal                 Vagina: atrophic appearing vagina with normal color and discharge, no lesions              Cervix: no lesions               Bimanual Exam:  Uterus:   no masses or tenderness              Adnexa: no mass, fullness, tenderness               Rectovaginal: Confirms               Anus:  normal sphincter tone, no lesions  Carolynn Serve, CMA chaperoned for the exam.    1. Gynecologic exam normal Discussed breast self exam Discussed calcium and vit D intake Mammogram, colonoscopy, DEXA are UTD Labs with primary  2. Screening for cervical cancer - Cytology - PAP

## 2023-02-18 ENCOUNTER — Encounter: Payer: Self-pay | Admitting: Family Medicine

## 2023-02-18 ENCOUNTER — Ambulatory Visit (INDEPENDENT_AMBULATORY_CARE_PROVIDER_SITE_OTHER): Payer: Medicare Other | Admitting: Family Medicine

## 2023-02-18 VITALS — BP 150/78 | HR 53 | Temp 98.0°F | Resp 99 | Ht 59.5 in | Wt 208.2 lb

## 2023-02-18 DIAGNOSIS — R739 Hyperglycemia, unspecified: Secondary | ICD-10-CM | POA: Diagnosis not present

## 2023-02-18 DIAGNOSIS — I10 Essential (primary) hypertension: Secondary | ICD-10-CM | POA: Diagnosis not present

## 2023-02-18 DIAGNOSIS — R7303 Prediabetes: Secondary | ICD-10-CM | POA: Diagnosis not present

## 2023-02-18 LAB — POCT GLYCOSYLATED HEMOGLOBIN (HGB A1C): Hemoglobin A1C: 5.4 % (ref 4.0–5.6)

## 2023-02-18 NOTE — Assessment & Plan Note (Signed)
Initially elevated to 160/89 but improved to 150/78 on recheck.  She has been well-controlled at home and at her previous office visits.  Will continue spironolactone 25 mg daily and she will let us know if persistently elevated at home.  Follow-up in 3 to 6 months.

## 2023-02-18 NOTE — Patient Instructions (Signed)
It was very nice to see you today!  Keep up the good work!  Please keep an eye on your blood pressure and let us know if it is persistently elevated.  Return in about 6 months (around 08/20/2023).   Take care, Dr Jimmey Ralph  PLEASE NOTE:  If you had any lab tests, please let us know if you have not heard back within a few days. You may see your results on mychart before we have a chance to review them but we will give you a call once they are reviewed by [place].   If we ordered any referrals today, please let us know if you have not heard from their office within the next week.   If you had any urgent prescriptions sent in today, please check with the pharmacy within an hour of our visit to make sure the prescription was transmitted appropriately.   Please try these tips to maintain a healthy lifestyle:  Eat at least 3 REAL meals and 1-2 snacks per day.  Aim for no more than 5 hours between eating.  If you eat breakfast, please do so within one hour of getting up.   Each meal should contain half fruits/vegetables, one quarter protein, and one quarter carbs (no bigger than a computer mouse)  Cut down on sweet beverages. This includes juice, soda, and sweet tea.   Drink at least 1 glass of water with each meal and aim for at least 8 glasses per day  Exercise at least 150 minutes every week.

## 2023-02-18 NOTE — Assessment & Plan Note (Signed)
No longer on any medications.  She is working on lifestyle modifications.  She has done a great job with this.  A1c is 5.4 without any meds.  She will continue to work on lifestyle modifications and we will recheck again in 6 months.

## 2023-02-18 NOTE — Progress Notes (Signed)
   Susan [name] is a 67 y.o. female who presents today for an office visit.  Assessment/Plan:  Chronic Problems Addressed Today: Hyperglycemia No longer on any medications.  She is working on lifestyle modifications.  She has done a great job with this.  A1c is 5.4 without any meds.  She will continue to work on lifestyle modifications and we will recheck again in 6 months.  Essential hypertension Initially elevated to 160/89 but improved to 150/78 on recheck.  She has been well-controlled at home and at her previous office visits.  Will continue spironolactone 25 mg daily and she will let us know if persistently elevated at home.  Follow-up in 3 to 6 months.     Subjective:  HPI:  See A/P for status of chronic conditions.  Patient is here today for follow-up.  We last saw her a few months ago.  At her last appointment we did discuss stopping her Ozempic.  She has been going to the gym routinely since our last visit.  She is working on cutting down on carbs and sugar.       Objective:  Physical Exam: BP (!) 150/78   Pulse (!) 53   Temp 98 F (36.7 C) (Temporal)   Resp (!) 99   Ht 4' 11.5" (1.511 m)   Wt 208 lb 3.2 oz (94.4 kg)   HC 6" (15.2 cm)   BMI 41.35 kg/m   Wt Readings from Last 3 Encounters:  02/18/23 208 lb 3.2 oz (94.4 kg)  12/06/22 204 lb (92.5 kg)  12/03/22 207 lb 9.6 oz (94.2 kg)  Gen: No acute distress, resting comfortably CV: Regular rate and rhythm with no murmurs appreciated Pulm: Normal work of breathing, clear to auscultation bilaterally with no crackles, wheezes, or rhonchi Neuro: Grossly normal, moves all extremities Psych: Normal affect and thought content      Lopaka Karge M. Jimmey Ralph, MD 02/18/2023 9:54 AM

## 2023-02-19 ENCOUNTER — Ambulatory Visit (INDEPENDENT_AMBULATORY_CARE_PROVIDER_SITE_OTHER): Payer: Medicare Other | Admitting: Obstetrics and Gynecology

## 2023-02-19 ENCOUNTER — Encounter: Payer: Self-pay | Admitting: Obstetrics and Gynecology

## 2023-02-19 ENCOUNTER — Other Ambulatory Visit (HOSPITAL_COMMUNITY)
Admission: RE | Admit: 2023-02-19 | Discharge: 2023-02-19 | Disposition: A | Discharge status: Home / Self Care | Payer: Medicare Other | Source: Ambulatory Visit | Attending: Obstetrics and Gynecology | Admitting: Obstetrics and Gynecology

## 2023-02-19 VITALS — BP 122/74 | HR 68 | Ht 59.5 in | Wt 210.0 lb

## 2023-02-19 DIAGNOSIS — Z01419 Encounter for gynecological examination (general) (routine) without abnormal findings: Secondary | ICD-10-CM

## 2023-02-19 DIAGNOSIS — Z124 Encounter for screening for malignant neoplasm of cervix: Secondary | ICD-10-CM

## 2023-02-19 DIAGNOSIS — E782 Mixed hyperlipidemia: Secondary | ICD-10-CM | POA: Insufficient documentation

## 2023-02-19 DIAGNOSIS — Z1151 Encounter for screening for human papillomavirus (HPV): Secondary | ICD-10-CM | POA: Diagnosis not present

## 2023-02-19 DIAGNOSIS — Z9189 Other specified personal risk factors, not elsewhere classified: Secondary | ICD-10-CM | POA: Diagnosis not present

## 2023-02-19 DIAGNOSIS — Z86010 Personal history of colonic polyps: Secondary | ICD-10-CM | POA: Insufficient documentation

## 2023-02-19 DIAGNOSIS — Z8742 Personal history of other diseases of the female genital tract: Secondary | ICD-10-CM

## 2023-02-19 NOTE — Patient Instructions (Signed)

## 2023-02-20 LAB — CYTOLOGY - PAP
Comment: NEGATIVE
Diagnosis: NEGATIVE
High risk HPV: NEGATIVE

## 2023-04-17 ENCOUNTER — Other Ambulatory Visit: Payer: Self-pay | Admitting: Family Medicine

## 2023-05-03 ENCOUNTER — Other Ambulatory Visit: Payer: Self-pay | Admitting: Family Medicine

## 2023-05-27 ENCOUNTER — Telehealth: Payer: Self-pay | Admitting: Internal Medicine

## 2023-05-27 NOTE — Telephone Encounter (Signed)
Allergy list shows muscle cramps on rosuvastatin 5mg  daily, headache/insomnia on pravastatin and simvastatin. Now aches and memory issues on atorvastatin. Has also taken lovastatin. Lipid clinic visit from 08/2022 mentions pt experienced headache on rosuvastatin, didn't remember taking other statins or associated side effects but didn't recall myalgias. Already takes ezetimibe. Prior abdominal CT showed aortic atherosclerosis so ideally would target LDL goal < 70.  If she doesn't want to retry a different statin, options would include either Repatha or Nexletol (could combine with her ezetimibe as the Nexlizet tablet), however both meds are branded so monthly copay would be ~$45/month.  Spoke with pt, states she's been going to the gym regularly since April, walking/elliptical for 6-7 miles a day. Doesn't want to try new medication at this time. Wants to stay on her Zetia and have her PCP recheck lipids in November. Can reassess at that time.

## 2023-05-27 NOTE — Telephone Encounter (Signed)
Pt c/o medication issue:  1. Name of Medication:   atorvastatin (LIPITOR) 10 MG tablet    2. How are you currently taking this medication (dosage and times per day)?   Take 1 tablet (10 mg total) by mouth daily    3. Are you having a reaction (difficulty breathing--STAT)? No  4. What is your medication issue? Pt states that medications is causing her to have aches and pains as well as some memory loss. She would like a callback regarding this matter. Please advise

## 2023-06-28 DIAGNOSIS — M79641 Pain in right hand: Secondary | ICD-10-CM | POA: Diagnosis not present

## 2023-07-17 ENCOUNTER — Other Ambulatory Visit: Payer: Self-pay | Admitting: Family Medicine

## 2023-07-30 ENCOUNTER — Telehealth: Payer: Self-pay | Admitting: Family Medicine

## 2023-07-30 NOTE — Telephone Encounter (Signed)
Patient states she got her initially shingles vaccination in March of 2022 @ [facility] on [address]. Patient states she called them and they said they didn't have a record of her getting her second one. Patient wants to know if she got second shingles vaccine here @ PCP office.

## 2023-08-01 ENCOUNTER — Other Ambulatory Visit: Payer: Self-pay | Admitting: Family Medicine

## 2023-08-01 NOTE — Telephone Encounter (Signed)
Patient called for an update. States she doesn't need a phone call but she wants at least a message with the information in MyChart.

## 2023-08-01 NOTE — Telephone Encounter (Signed)
Patient aware 1st shingles vaccine done on 07/06/2020

## 2023-08-22 ENCOUNTER — Ambulatory Visit: Payer: Medicare Other

## 2023-08-22 VITALS — Wt 210.0 lb

## 2023-08-22 DIAGNOSIS — Z0000 Encounter for general adult medical examination without abnormal findings: Secondary | ICD-10-CM

## 2023-08-22 NOTE — Progress Notes (Signed)
Subjective:   Susan [name] is a 67 y.o. female who presents for Medicare Annual (Subsequent) preventive examination.  Visit Complete: Virtual I connected with  Susan [name] on 08/22/23 by a audio enabled telemedicine application and verified that I am speaking with the correct person using two identifiers.  Patient Location: Home  Provider Location: Office/Clinic  I discussed the limitations of evaluation and management by telemedicine. The patient expressed understanding and agreed to proceed.  Vital Signs: Because this visit was a virtual/telehealth visit, some criteria may be missing or patient reported. Any vitals not documented were not able to be obtained and vitals that have been documented are patient reported.   Cardiac Risk Factors include: advanced age (>45men, >38 women);hypertension;dyslipidemia;obesity (BMI >30kg/m2)     Objective:    Today's Vitals   08/22/23 1431  Weight: 210 lb (95.3 kg)   Body mass index is 41.71 kg/m.     08/22/2023    2:37 PM 12/06/2022    3:30 PM 07/16/2022    3:35 PM 09/25/2021    1:02 PM 07/12/2021    3:11 PM  Advanced Directives  Does Patient Have a Medical Advance Directive? Yes No Yes No Yes  Type of Estate agent of [place];Living will  Healthcare Power of [place];Living will  Healthcare Power of [place];Living will  Does patient want to make changes to medical advance directive?   No - Patient declined  No - Patient declined  Copy of Healthcare Power of Attorney in Chart? No - copy requested  No - copy requested  No - copy requested  Would patient like information on creating a medical advance directive?  No - Patient declined  No - Patient declined     Current Medications (verified) Outpatient Encounter Medications as of 08/22/2023  Medication Sig   albuterol (VENTOLIN HFA) 108 (90 Base) MCG/ACT inhaler inhale 2 puffs into the lungs by mouth every 6 hours as needed for wheezing or  shortness of breath   CALCIUM PO Take 1 tablet by mouth once daily  Indications: osteopenia   cholecalciferol (VITAMIN D3) 25 MCG (1000 UNIT) tablet Take 5,000 Units by mouth daily.   diclofenac Sodium (VOLTAREN) 1 % GEL Apply 4 g to affected area up to 4 times daily.   EPINEPHrine 0.3 mg/0.3 mL IJ SOAJ injection Inject 0.3 mg into the muscle once as needed (anaphylaxis/allergic reaction).   ezetimibe (ZETIA) 10 MG tablet TAKE ONE TABLET BY MOUTH ONCE A DAY   fluocinonide ointment (LIDEX) 0.05 % Apply 1 Application topically 2 (two) times daily.   orphenadrine (NORFLEX) 100 MG tablet Take 100 mg by mouth as needed for muscle spasms.   POTASSIUM GLUCONATE PO Take by mouth.   spironolactone (ALDACTONE) 25 MG tablet TAKE ONE TABLET BY MOUTH ONCE A DAY   No facility-administered encounter medications on file as of 08/22/2023.    Allergies (verified) Aspirin, Avelox [moxifloxacin hcl in nacl], Shellfish allergy, Bee venom, Cat hair extract, Ibuprofen, Sulfa antibiotics, Atorvastatin, Bee pollen, Codeine, Crestor [rosuvastatin calcium], Penicillins, Pravastatin sodium [pravachol], and Simvastatin   History: Past Medical History:  Diagnosis Date   Allergy    Anemia    Anxiety state, unspecified    Back injury    HLD (hyperlipidemia)    intol of statins - follows with lipid clinic   HTN (hypertension)    Irritable bowel syndrome    Lumbosacral spondylosis without myelopathy    improved with PT   Morbid obesity (HCC)    Past  Surgical History:  Procedure Laterality Date   ABSCESS DRAINAGE     abdominal   CESAREAN SECTION  1977   Family History  Problem Relation Age of Onset   Breast cancer Mother    Diabetes Mother    Hypertension Mother    Multiple myeloma Mother    Diabetes Father        on dialysis   Kidney failure Father    Colon cancer Maternal Grandmother    Social History   Socioeconomic History   Marital status: Divorced    Spouse name: Not on file   Number of  children: 1   Years of education: college   Highest education level: Not on file  Occupational History   Occupation: Retired    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[place] [employer]    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   Tobacco Use   Smoking status: Never   Smokeless tobacco: Never  Vaping Use   Vaping status: Never Used  Substance and Sexual Activity   Alcohol use: No   Drug use: No   Sexual activity: Not Currently  Other Topics Concern   Not on file  Social History Narrative   Lives alone, divorced since 54   Adult dtr in [place]   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   Social Determinants of Health   Financial Resource Strain: Low Risk  (08/22/2023)   Overall Financial Resource Strain (CARDIA)    Difficulty of Paying Living Expenses: Not hard at all  Food Insecurity: No Food Insecurity (08/22/2023)   Hunger Vital Sign    Worried About Running Out of Food in the Last Year: Never true    Ran Out of Food in the Last Year: Never true  Transportation Needs: No Transportation Needs (08/22/2023)   PRAPARE - Administrator, Civil Service (Medical): No    Lack of Transportation (Non-Medical): No  Physical Activity: Sufficiently Active (08/22/2023)   Exercise Vital Sign    Days of Exercise per Week: 5 days    Minutes of Exercise per Session: 60 min  Stress: No Stress Concern Present (08/22/2023)   [employer] of Occupational Health - Occupational Stress Questionnaire    Feeling of Stress : Not at all  Social Connections: Moderately Isolated (08/22/2023)   Social Connection and Isolation Panel [NHANES]    Frequency of Communication with Friends and Family: More than three times a week    Frequency of Social Gatherings with Friends and Family: More than three times a week    Attends Religious Services: 1 to 4 times per year    Active Member of [employer] or Organizations: No    Attends Engineer, structural: Never    Marital Status: Divorced    Tobacco Counseling Counseling given: Not  Answered   Clinical Intake:  Pre-visit preparation completed: Yes  Pain : No/denies pain     BMI - recorded: 41.71 Nutritional Status: BMI > 30  Obese Nutritional Risks: None Diabetes: No  How often do you need to have someone help you when you read instructions, pamphlets, or other written materials from your doctor or pharmacy?: 1 - Never  Interpreter Needed?: No  Information entered by :: Lanier Ensign, LPN   Activities of Daily Living    08/22/2023    2:33 PM  In your present state of health, do you have any difficulty performing the following activities:  Hearing? 0  Vision? 0  Difficulty concentrating or making decisions? 0  Walking or climbing stairs?  0  Dressing or bathing? 0  Doing errands, shopping? 0  Preparing Food and eating ? N  Using the Toilet? N  In the past six months, have you accidently leaked urine? N  Do you have problems with loss of bowel control? N  Managing your Medications? N  Managing your Finances? N  Housekeeping or managing your Housekeeping? N    Patient Care Team: Ardith Dark, MD as PCP - General (Family Medicine) Pati Gallo, MD (Sports Medicine) Jeani Hawking, MD (Gastroenterology) Olivia Mackie, MD (Obstetrics and Gynecology)  Indicate any recent Medical Services you may have received from other than [facility] providers in the past year (date may be approximate).     Assessment:   This is a routine wellness examination for Kalee.  Hearing/Vision screen Hearing Screening - Comments:: Pt denies any hearing issues  Vision Screening - Comments:: [facility]    Goals Addressed             This Visit's Progress    Patient Stated       Maintain health and activity        Depression Screen    08/22/2023    2:35 PM 12/03/2022    9:42 AM 11/30/2022    2:09 PM 11/16/2022    8:59 AM 07/19/2022   10:01 AM 07/16/2022    3:32 PM 07/12/2021    3:12 PM  PHQ 2/9 Scores  PHQ - 2 Score 0  0 0 0 0 0 0    Fall Risk    08/22/2023    2:37 PM 12/03/2022    9:41 AM 11/30/2022    2:09 PM 11/16/2022    8:59 AM 07/19/2022   10:01 AM  Fall Risk   Falls in the past year? 0 1 0 0 0  Number falls in past yr: 0 0 0 0 0  Injury with Fall? 0 0 0 0 0  Risk for fall due to : No Fall Risks History of fall(s) No Fall Risks No Fall Risks No Fall Risks  Follow up Falls prevention discussed Falls evaluation completed       MEDICARE RISK AT HOME: Medicare Risk at Home Any stairs in or around the home?: Yes If so, are there any without handrails?: No Home free of loose throw rugs in walkways, pet beds, electrical cords, etc?: Yes Adequate lighting in your home to reduce risk of falls?: Yes Life alert?: No Use of a cane, walker or w/c?: No Grab bars in the bathroom?: Yes Shower chair or bench in shower?: No Elevated toilet seat or a handicapped toilet?: No  TIMED UP AND GO:  Was the test performed?  No    Cognitive Function:        08/22/2023    2:38 PM 07/16/2022    3:38 PM 07/12/2021    3:13 PM  6CIT Screen  What Year? 0 points 0 points   What month? 0 points 0 points 0 points  What time? 0 points 0 points 0 points  Count back from 20 0 points 0 points 0 points  Months in reverse 0 points 0 points 0 points  Repeat phrase 0 points 0 points 0 points  Total Score 0 points 0 points     Immunizations Immunization History  Administered Date(s) Administered   Dtap, Unspecified 02/20/1955, 03/26/1955, 06/30/1958   Fluad Quad(high Dose 65+) 08/09/2020, 07/30/2022   Influenza, High Dose Seasonal PF 07/10/2021, 07/31/2023   Influenza,inj,Quad PF,6+  Mos 09/04/2019   Influenza-Unspecified 08/09/2020, 07/10/2021, 07/30/2022   PFIZER(Purple Top)SARS-COV-2 Vaccination 12/09/2019, 12/30/2019, 07/23/2020, 02/27/2021   PNEUMOCOCCAL CONJUGATE-20 11/15/2021   Pfizer Covid-19 Vaccine Bivalent Booster 62yrs & up 08/14/2021   Pfizer(Comirnaty)Fall Seasonal Vaccine 12 years and older  08/17/2022   Polio, Unspecified 05/29/1955, 06/21/1955, 01/18/1956, 06/30/1958, 04/27/1965   Smallpox 03/15/1964   Tdap 02/05/2014, 02/27/2016   Tetanus 03/15/1964   Zoster Recombinant(Shingrix) 07/06/2020, 01/06/2021   Zoster, Live 01/06/2021    TDAP status: Up to date  Flu Vaccine status: Up to date  Pneumococcal vaccine status: Up to date  Covid-19 vaccine status: Completed vaccines  Qualifies for Shingles Vaccine? Yes   Zostavax completed Yes   Shingrix Completed?: Yes  Screening Tests Health Maintenance  Topic Date Due   Colonoscopy  03/22/2023   Medicare Annual Wellness (AWV)  08/21/2024   MAMMOGRAM  09/28/2024   DEXA SCAN  11/27/2024   DTaP/Tdap/Td (7 - Td or Tdap) 02/26/2026   Pneumonia Vaccine 3+ Years old  Completed   INFLUENZA VACCINE  Completed   COVID-19 Vaccine  Completed   Hepatitis C Screening  Completed   HPV VACCINES  Aged Out   Zoster Vaccines- Shingrix  Discontinued    Health Maintenance  Health Maintenance Due  Topic Date Due   Colonoscopy  03/22/2023    Colorectal cancer screening: Type of screening: Colonoscopy. Completed 03/21/18. Repeat every 5 years pt stated she was told 10 years  Mammogram status: Completed 09/28/24. Repeat every year  Bone Density status: Completed 11/27/22. Results reflect: Bone density results: OSTEOPOROSIS. Repeat every 2 years.   Additional Screening:  Hepatitis C Screening:  Completed 10/06/18  Vision Screening: Recommended annual ophthalmology exams for early detection of glaucoma and other disorders of the eye. Is the patient up to date with their annual eye exam?  Yes  Who is the provider or what is the name of the office in which the patient attends annual eye exams? [facility] If pt is not established with a provider, would they like to be referred to a provider to establish care? No .   Dental Screening: Recommended annual dental exams for proper oral hygiene   Community Resource Referral / Chronic  Care Management: CRR required this visit?  No   CCM required this visit?  No     Plan:     I have personally reviewed and noted the following in the patient's chart:   Medical and social history Use of alcohol, tobacco or illicit drugs  Current medications and supplements including opioid prescriptions. Patient is not currently taking opioid prescriptions. Functional ability and status Nutritional status Physical activity Advanced directives List of other physicians Hospitalizations, surgeries, and ER visits in previous 12 months Vitals Screenings to include cognitive, depression, and falls Referrals and appointments  In addition, I have reviewed and discussed with patient certain preventive protocols, quality metrics, and best practice recommendations. A written personalized care plan for preventive services as well as general preventive health recommendations were provided to patient.     Marzella Schlein, LPN   [id number]   After Visit Summary: (MyChart) Due to this being a telephonic visit, the after visit summary with patients personalized plan was offered to patient via MyChart   Nurse Notes: none

## 2023-08-22 NOTE — Patient Instructions (Signed)
Susan [name] , Thank you for taking time to come for your Medicare Wellness Visit. I appreciate your ongoing commitment to your health goals. Please review the following plan we discussed and let me know if I can assist you in the future.   Referrals/Orders/Follow-Ups/Clinician Recommendations: Aim for 30 minutes of exercise or brisk walking, 6-8 glasses of water, and 5 servings of fruits and vegetables each day.   This is a list of the screening recommended for you and due dates:  Health Maintenance  Topic Date Due   Zoster (Shingles) Vaccine (2 of 2) 03/03/2021   Colon Cancer Screening  03/22/2023   Medicare Annual Wellness Visit  08/21/2024   Mammogram  09/28/2024   DEXA scan (bone density measurement)  11/27/2024   DTaP/Tdap/Td vaccine (7 - Td or Tdap) 02/26/2026   Pneumonia Vaccine  Completed   Flu Shot  Completed   COVID-19 Vaccine  Completed   Hepatitis C Screening  Completed   HPV Vaccine  Aged Out    Advanced directives: (Copy Requested) Please bring a copy of your health care power of attorney and living will to the office to be added to your chart at your convenience.  Next Medicare Annual Wellness Visit scheduled for next year: Yes

## 2023-08-23 ENCOUNTER — Encounter: Payer: Self-pay | Admitting: Family Medicine

## 2023-08-23 ENCOUNTER — Ambulatory Visit (INDEPENDENT_AMBULATORY_CARE_PROVIDER_SITE_OTHER): Payer: Medicare Other | Admitting: Family Medicine

## 2023-08-23 VITALS — BP 134/78 | HR 58 | Temp 98.0°F | Ht 59.5 in | Wt 205.6 lb

## 2023-08-23 DIAGNOSIS — I10 Essential (primary) hypertension: Secondary | ICD-10-CM | POA: Diagnosis not present

## 2023-08-23 DIAGNOSIS — M810 Age-related osteoporosis without current pathological fracture: Secondary | ICD-10-CM

## 2023-08-23 DIAGNOSIS — Z1211 Encounter for screening for malignant neoplasm of colon: Secondary | ICD-10-CM

## 2023-08-23 DIAGNOSIS — R296 Repeated falls: Secondary | ICD-10-CM

## 2023-08-23 DIAGNOSIS — Z91030 Bee allergy status: Secondary | ICD-10-CM

## 2023-08-23 DIAGNOSIS — R739 Hyperglycemia, unspecified: Secondary | ICD-10-CM

## 2023-08-23 DIAGNOSIS — J4521 Mild intermittent asthma with (acute) exacerbation: Secondary | ICD-10-CM | POA: Diagnosis not present

## 2023-08-23 DIAGNOSIS — W19XXXD Unspecified fall, subsequent encounter: Secondary | ICD-10-CM

## 2023-08-23 DIAGNOSIS — Z6841 Body Mass Index (BMI) 40.0 and over, adult: Secondary | ICD-10-CM

## 2023-08-23 LAB — CBC
HCT: 38.1 % (ref 36.0–46.0)
Hemoglobin: 12.4 g/dL (ref 12.0–15.0)
MCHC: 32.6 g/dL (ref 30.0–36.0)
MCV: 93.5 fL (ref 78.0–100.0)
Platelets: 347 10*3/uL (ref 150.0–400.0)
RBC: 4.08 Mil/uL (ref 3.87–5.11)
RDW: 14.4 % (ref 11.5–15.5)
WBC: 7 10*3/uL (ref 4.0–10.5)

## 2023-08-23 LAB — COMPREHENSIVE METABOLIC PANEL
ALT: 10 U/L (ref 0–35)
AST: 19 U/L (ref 0–37)
Albumin: 4.2 g/dL (ref 3.5–5.2)
Alkaline Phosphatase: 69 U/L (ref 39–117)
BUN: 14 mg/dL (ref 6–23)
CO2: 29 meq/L (ref 19–32)
Calcium: 9.6 mg/dL (ref 8.4–10.5)
Chloride: 104 meq/L (ref 96–112)
Creatinine, Ser: 0.68 mg/dL (ref 0.40–1.20)
GFR: 89.28 mL/min (ref >60.00)
Glucose, Bld: 100 mg/dL — ABNORMAL HIGH (ref 70–99)
Potassium: 4.5 meq/L (ref 3.5–5.1)
Sodium: 138 meq/L (ref 135–145)
Total Bilirubin: 0.5 mg/dL (ref 0.2–1.2)
Total Protein: 7.5 g/dL (ref 6.0–8.3)

## 2023-08-23 LAB — VITAMIN D 25 HYDROXY (VIT D DEFICIENCY, FRACTURES): VITD: 42.97 ng/mL (ref 30.00–100.00)

## 2023-08-23 LAB — POCT GLYCOSYLATED HEMOGLOBIN (HGB A1C): Hemoglobin A1C: 5.3 % (ref 4.0–5.6)

## 2023-08-23 LAB — TSH: TSH: 1.14 u[IU]/mL (ref 0.35–5.50)

## 2023-08-23 MED ORDER — ALBUTEROL SULFATE HFA 108 (90 BASE) MCG/ACT IN AERS: 1.0000 | INHALATION_SPRAY | Freq: Four times a day (QID) | RESPIRATORY_TRACT | 0 refills | Status: DC | PRN

## 2023-08-23 MED ORDER — EPINEPHRINE 0.3 MG/0.3ML IJ SOAJ: 0.3000 mg | Freq: Once | INTRAMUSCULAR | 0 refills | Status: DC | PRN

## 2023-08-23 NOTE — Patient Instructions (Signed)
It was very nice to see you today!  Your blood sugar looks great today.  We will refill your medications.  I will refer you to see the physical therapist.  We will see back in 6 months for your annual physical.  Come back sooner if needed.  Return in about 6 months (around 02/20/2024) for Annual Physical.   Take care, Dr Jimmey Ralph  PLEASE NOTE:  If you had any lab tests, please let us know if you have not heard back within a few days. You may see your results on mychart before we have a chance to review them but we will give you a call once they are reviewed by [place].   If we ordered any referrals today, please let us know if you have not heard from their office within the next week.   If you had any urgent prescriptions sent in today, please check with the pharmacy within an hour of our visit to make sure the prescription was transmitted appropriately.   Please try these tips to maintain a healthy lifestyle:  Eat at least 3 REAL meals and 1-2 snacks per day.  Aim for no more than 5 hours between eating.  If you eat breakfast, please do so within one hour of getting up.   Each meal should contain half fruits/vegetables, one quarter protein, and one quarter carbs (no bigger than a computer mouse)  Cut down on sweet beverages. This includes juice, soda, and sweet tea.   Drink at least 1 glass of water with each meal and aim for at least 8 glasses per day  Exercise at least 150 minutes every week.

## 2023-08-23 NOTE — Progress Notes (Signed)
   Susan [name] is a 67 y.o. female who presents today for an office visit.  Assessment/Plan:  New/Acute Problems: Fall No red flags.  No injuries.  She has had a few recurrent falls last several weeks.  We will check labs today and refer to PT.  Chronic Problems Addressed Today: Essential hypertension Blood pressure at goal today on spironolactone 25 mg daily.  She will continue to monitor at home.  Recheck 6 months.  Osteoporosis with last DEXA 02/2020 Check vitamin D.   Hyperglycemia See well-controlled 5.3 without meds.  She is doing a great job with diet and exercise.  Morbid obesity (HCC) She is down about 5 pounds since last visit.  Congratulated her on weight loss.  She will continue to work on diet and exercise.  Recheck in 6 months.  Has not tolerated Ozempic in the past due to side effects.      Subjective:  HPI:  See Assessment / plan for status of chronic conditions. She is here today for 6 month follow up.  Overall she is doing well.  She has fallen few times in the last few months.  These are mechanical in nature.  Most recently tripped on her porch.  Did not have any serious injuries afterwards.  She is going to the gym routinely and working on treadmill and exercise bike.       Objective:  Physical Exam: BP 134/78   Pulse (!) 58   Temp 98 F (36.7 C) (Temporal)   Ht 4' 11.5" (1.511 m)   Wt 205 lb 9.6 oz (93.3 kg)   SpO2 100%   BMI 40.83 kg/m   Wt Readings from Last 3 Encounters:  08/23/23 205 lb 9.6 oz (93.3 kg)  08/22/23 210 lb (95.3 kg)  02/19/23 210 lb (95.3 kg)  Gen: No acute distress, resting comfortably CV: Regular rate and rhythm with no murmurs appreciated Pulm: Normal work of breathing, clear to auscultation bilaterally with no crackles, wheezes, or rhonchi Neuro: Grossly normal, moves all extremities Psych: Normal affect and thought content      Erminio Nygard M. Jimmey Ralph, MD 08/23/2023 10:35 AM

## 2023-08-23 NOTE — Assessment & Plan Note (Signed)
Blood pressure at goal today on spironolactone 25 mg daily.  She will continue to monitor at home.  Recheck 6 months.

## 2023-08-23 NOTE — Assessment & Plan Note (Signed)
Check vitamin D. 

## 2023-08-23 NOTE — Assessment & Plan Note (Signed)
See well-controlled 5.3 without meds.  She is doing a great job with diet and exercise.

## 2023-08-23 NOTE — Assessment & Plan Note (Signed)
Refill epi pen  

## 2023-08-23 NOTE — Assessment & Plan Note (Signed)
She is down about 5 pounds since last visit.  Congratulated her on weight loss.  She will continue to work on diet and exercise.  Recheck in 6 months.  Has not tolerated Ozempic in the past due to side effects.

## 2023-08-27 ENCOUNTER — Other Ambulatory Visit: Payer: Self-pay | Admitting: Family Medicine

## 2023-08-27 DIAGNOSIS — Z1231 Encounter for screening mammogram for malignant neoplasm of breast: Secondary | ICD-10-CM

## 2023-08-28 NOTE — Progress Notes (Signed)
Labs are all stable.  Do not need to make any changes to her treatment plan.  She should continue working on diet and exercise and we can recheck everything in 6 to 12 months.

## 2023-08-29 ENCOUNTER — Telehealth: Payer: Self-pay | Admitting: Gastroenterology

## 2023-08-29 NOTE — Telephone Encounter (Signed)
Good morning Dr. Adela Lank,    Supervising Provider AM 08/29/23    We received a referral for patient for a colonoscopy. Patient last colonoscopy was in 2019 with [facility]. Patient is requesting a transfer of care due to being advised that she would not be able to have another colonoscopy until 2029. Patient is wishing to have another colonoscopy soon than 2029. Patient previous records are in [facility] under procedures tab for you to review and advise on scheduling.        Thank you.

## 2023-08-29 NOTE — Telephone Encounter (Signed)
  One diminutive adenoma in 2013, and then a normal exam 02/2018.  Per national guidelines she does not warrant a colonoscopy now, would not be for 10 years from her last exam.  I do not know if insurance would cover it given she is not due for 10 years.  If she having symptoms that bother her or if she is adamant she wants another colonoscopy, she can see [place] in the office and we can discuss it further, but I cannot book her directly for colonoscopy with the information we have on hand today. Thanks

## 2023-09-02 ENCOUNTER — Encounter: Payer: Self-pay | Admitting: Physician Assistant

## 2023-09-02 NOTE — Telephone Encounter (Signed)
Called patient to advised and schedule office visit. Left voicemail.

## 2023-09-18 ENCOUNTER — Other Ambulatory Visit: Payer: Self-pay | Admitting: Family Medicine

## 2023-09-18 DIAGNOSIS — J4521 Mild intermittent asthma with (acute) exacerbation: Secondary | ICD-10-CM

## 2023-10-04 ENCOUNTER — Ambulatory Visit
Admission: RE | Admit: 2023-10-04 | Discharge: 2023-10-04 | Disposition: A | Discharge status: Home / Self Care | Payer: Medicare Other | Source: Ambulatory Visit | Attending: Family Medicine | Admitting: Family Medicine

## 2023-10-04 DIAGNOSIS — Z1231 Encounter for screening mammogram for malignant neoplasm of breast: Secondary | ICD-10-CM | POA: Diagnosis not present

## 2023-10-06 NOTE — Therapy (Signed)
OUTPATIENT PHYSICAL THERAPY LOWER EXTREMITY EVALUATION   Patient Name: Susan [name] MRN: [id number] DOB:14-Dec-1955, 67 y.o., female Today's Date: 10/08/2023  END OF SESSION:  PT End of Session - 10/07/23 1545     Visit Number 1    Number of Visits 9    Date for PT Re-Evaluation 11/18/23    Authorization Type [employer] MCR    PT Start Time 1539    PT Stop Time 1628    PT Time Calculation (min) 49 min    Activity Tolerance Patient tolerated treatment well    Behavior During Therapy WFL for tasks assessed/performed             Past Medical History:  Diagnosis Date   Allergy    Anemia    Anxiety state, unspecified    Back injury    HLD (hyperlipidemia)    intol of statins - follows with lipid clinic   HTN (hypertension)    Irritable bowel syndrome    Lumbosacral spondylosis without myelopathy    improved with PT   Morbid obesity (HCC)    Past Surgical History:  Procedure Laterality Date   ABSCESS DRAINAGE     abdominal   CESAREAN SECTION  1977   Patient Active Problem List   Diagnosis Date Noted   History of colonic polyps 02/19/2023   Mixed hyperlipidemia 02/19/2023   Allergic rhinitis 02/23/2022   Bee sting allergy 02/23/2022   Constipation 02/23/2022   Hyperglycemia 11/15/2021   Fibroids 01/30/2021   Osteoporosis with last DEXA 02/2020 03/15/2020   Alopecia 08/21/2019   Hemorrhoid 08/21/2019   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   Skin inflammation 04/21/2015   VENOUS INSUFFICIENCY 08/18/2010   SPONDYLOSIS, LUMBAR 01/17/2009   Dyslipidemia 11/26/2007   Morbid obesity (HCC) 11/26/2007   Essential hypertension 11/25/2007    PCP: Ardith Dark, MD  REFERRING PROVIDER: Ardith Dark, MD  REFERRING DIAG: W19.XXXD (ICD-10-CM) - Fall, subsequent encounter  THERAPY DIAG:  Muscle weakness (generalized)  History of falling  Other lack of coordination  Rationale for Evaluation and Treatment: Rehabilitation  ONSET DATE: October 2024  SUBJECTIVE:                                                                                                                                                                                   SUBJECTIVE STATEMENT:  MD visit/PT order 11/1  Pt had 2 falls approx 2 months ago.  Pt fell when she was descending her 2 steps off her porch.  About 1 week later, she fell in her bedroom.  She doesn't know what happened, but she just fell.  Pt states she injured her R foot and L thigh on the  1st fall.  Pt states she has frequent pain in R lateral foot with ambulation.     Pt states she felt like she was tripping over her feet approx 2 months ago.  Pt doesn't feel unsteady when she is walking.  Pt goes to the gym 4-5 days per week.  She performs exercise bikes 4-5 miles and TM 1 mile.  If her foot bothers her, she doesn't use TM.  Pt states her back is much better since performing gym exercises.     PERTINENT HISTORY: Chronic LBP due to back injury at work in 2014 ; in pain management for lower back ; Lumbar DDD Chronic R shoulder pain HTN, anemia, obesity, and hyperglycemia Osteoporosis vs osteopenia   PAIN:  NPRS:  5/10 current, 6-7/10 worst, 0/10 best Location:  R lateral foot  NPRS:  6/10, 8/10 worst, 0/10 best Location:  central lumbar and thoracic Pt states her back does not typically bother her, but does today due to the weather.   PRECAUTIONS: Fall and Other: osteoporosis vs osteopenia   WEIGHT BEARING RESTRICTIONS: No  FALLS:  Has patient fallen in last 6 months? Yes. Number of falls 2   OCCUPATION: Pt is retired  PLOF: Independent  PATIENT GOALS: improve strength in LE's   OBJECTIVE:  Note: Objective measures were completed at Evaluation unless otherwise noted.  DIAGNOSTIC FINDINGS: N/A  PATIENT SURVEYS:  FOTO 57 with a goal of 31 at visit 9  COGNITION: Overall cognitive status: Within functional limits for tasks assessed       LOWER EXTREMITY MMT:  MMT Right eval Left eval  Hip  flexion 5/5 5/5  Hip extension    Hip abduction 3+/5 4-/5  Hip adduction    Hip internal rotation    Hip external rotation    Knee flexion 5/5 seated 5/5 seated  Knee extension 4/5 4/5  Ankle dorsiflexion 5/5 5/5  Ankle plantarflexion WFL seated WFL seated  Ankle inversion    Ankle eversion     (Blank rows = not tested)   FUNCTIONAL TESTS:  5x STS test:  16.8 without Ue's.  TUG:  7.57 sec Balance:   Romberg standing:  1 min without LOB Standing with FA with EC:  20 sec without LOB Tandem stance:  R:  10 sec, L:  30 sec SLS:  Unable to perform  GAIT: Assistive device utilized: None Level of assistance: Complete Independence Comments:  Pt ambulated without an AD without any LOB.  Very limited reciprocal arm swing.   TODAY'S TREATMENT:                                                                                                                                PATIENT EDUCATION:  Education details: dx, objective findings, rationale of exercises, POC, and relevant anatomy.  PT answered pt's questions.  Person educated: Patient Education method: Explanation Education comprehension: verbalized understanding  HOME EXERCISE PROGRAM: Will give next visit  ASSESSMENT:  CLINICAL IMPRESSION: Patient is a 67 y.o. female with a dx of fall.  Pt had 2 falls approx 2 months ago reports she felt like she was tripping over her feet approx 2 months ago.  Pt states she has frequent pain in R lateral foot with ambulation.  Pt currently doesn't feel unsteady when she is walking.  Pt has chronic back pain and receives pain management.  She has been going to the gym regularly and reports her back has been feeling much better.  Pt has weakness in bilat LE's.  Pt had a good score on the TUG test though required increased time on 5x STS test.  Pt also had balance deficits as noted with tandem stance with R LE back.  Pt should benefit from skilled PT services to address impairments and improve  overall function.      OBJECTIVE IMPAIRMENTS: decreased balance, decreased mobility, decreased strength, and pain.   ACTIVITY LIMITATIONS: squatting and stairs  PARTICIPATION LIMITATIONS: community activity  PERSONAL FACTORS: 1-2 comorbidities: chronic LBP and obesity  are also affecting patient's functional outcome.   REHAB POTENTIAL: Good  CLINICAL DECISION MAKING: Stable/uncomplicated  EVALUATION COMPLEXITY: Low   GOALS:   SHORT TERM GOALS: Target date: 10/28/2023  Pt will be independent and compliant with HEP for improved strength, balance, and function.  Baseline: Goal status: INITIAL  2.  Pt will be able to perform tandem stance x 30 sec with R LE back for improved balance and proprioception.   Baseline:  Goal status: INITIAL    LONG TERM GOALS: Target date: 11/18/2023  Pt will deny having any falls.  Baseline:  Goal status: INITIAL  2.  Pt will report she has good stability with all of her daily activities and functional mobility.  Baseline:  Goal status: INITIAL  3.  Pt will demo improved 5x STS test by 4 sec for improved functional LE strength and decreased fall risk.  Baseline:  Goal status: INITIAL  4.  Pt will demo improved bilat LE strength to 4+/5 MMT in hip abd and 5/5 in knee extension for improved performance of functional mobility.   Baseline:  Goal status: INITIAL    PLAN:  PT FREQUENCY: 1-2x/week  PT DURATION: 6 weeks  PLANNED INTERVENTIONS: 97164- PT Re-evaluation, 97110-Therapeutic exercises, 97530- Therapeutic activity, [id number]- Neuromuscular re-education, 97535- Self Care, 95621- Manual therapy, [id number]- Gait training, [phone]- Aquatic Therapy, 97014- Electrical stimulation (unattended), 97035- Ultrasound, Patient/Family education, Balance training, Stair training, Taping, Dry Needling, Cryotherapy, and Moist heat  PLAN FOR NEXT SESSION: Cont with balance and strengthening.  Establish HEP.  Date of referral: 08/23/2023 Referring provider:  Ardith Dark, MD   Referring diagnosis? W19.XXXD (ICD-10-CM) - Fall, subsequent encounter  Treatment diagnosis? (if different than referring diagnosis)  Muscle weakness (generalized)  History of falling  Other lack of coordination  What was this (referring dx) caused by? Thana Ates of Condition: Initial Onset (within last 3 months)   Laterality:   Current Functional Measure Score: FOTO 57  Objective measurements identify impairments when they are compared to normal values, the uninvolved extremity, and prior level of function.  [x]  Yes  []  No  Objective assessment of functional ability:    Briefly describe symptoms: pain in left foot and left thigh sometimes.  Pt had 2 falls approx 2 months ago.  How did symptoms start: fall of her porch  Average pain intensity:  Last 24 hours: 2/10  Past week: 3/10  How often does the pt experience  symptoms? Frequently  How much have the symptoms interfered with usual daily activities? Not at all  How has condition changed since care began at this facility? NA - initial visit  In general, how is the patients overall health? Good   BACK PAIN (STarT Back Screening Tool) No   Audie Clear III PT, DPT 10/09/23 12:11 AM

## 2023-10-07 ENCOUNTER — Ambulatory Visit (HOSPITAL_BASED_OUTPATIENT_CLINIC_OR_DEPARTMENT_OTHER): Payer: Medicare Other | Attending: Family Medicine | Admitting: Physical Therapy

## 2023-10-07 DIAGNOSIS — M6281 Muscle weakness (generalized): Secondary | ICD-10-CM | POA: Insufficient documentation

## 2023-10-07 DIAGNOSIS — Z9181 History of falling: Secondary | ICD-10-CM | POA: Diagnosis not present

## 2023-10-07 DIAGNOSIS — W19XXXD Unspecified fall, subsequent encounter: Secondary | ICD-10-CM | POA: Diagnosis not present

## 2023-10-07 DIAGNOSIS — R278 Other lack of coordination: Secondary | ICD-10-CM | POA: Insufficient documentation

## 2023-10-08 ENCOUNTER — Encounter (HOSPITAL_BASED_OUTPATIENT_CLINIC_OR_DEPARTMENT_OTHER): Payer: Self-pay | Admitting: Physical Therapy

## 2023-10-08 ENCOUNTER — Other Ambulatory Visit: Payer: Self-pay

## 2023-10-08 NOTE — Therapy (Incomplete)
OUTPATIENT PHYSICAL THERAPY LOWER EXTREMITY EVALUATION   Patient Name: Susan [name] MRN: [id number] DOB:December 20, 1955, 67 y.o., female Today's Date: 10/08/2023  END OF SESSION:  PT End of Session - 10/07/23 1545     Visit Number 1    Number of Visits 10    Date for PT Re-Evaluation 11/18/23    Authorization Type [employer] MCR    PT Start Time 1539    PT Stop Time 1628    PT Time Calculation (min) 49 min    Activity Tolerance Patient tolerated treatment well    Behavior During Therapy WFL for tasks assessed/performed             Past Medical History:  Diagnosis Date  . Allergy   . Anemia   . Anxiety state, unspecified   . Back injury   . HLD (hyperlipidemia)    intol of statins - follows with lipid clinic  . HTN (hypertension)   . Irritable bowel syndrome   . Lumbosacral spondylosis without myelopathy    improved with PT  . Morbid obesity (HCC)    Past Surgical History:  Procedure Laterality Date  . ABSCESS DRAINAGE     abdominal  . CESAREAN SECTION  1977   Patient Active Problem List   Diagnosis Date Noted  . History of colonic polyps 02/19/2023  . Mixed hyperlipidemia 02/19/2023  . Allergic rhinitis 02/23/2022  . Bee sting allergy 02/23/2022  . Constipation 02/23/2022  . Hyperglycemia 11/15/2021  . Fibroids 01/30/2021  . Osteoporosis with last DEXA 02/2020 03/15/2020  . Alopecia 08/21/2019  . Hemorrhoid 08/21/2019  . 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 Skin inflammation 04/21/2015  . VENOUS INSUFFICIENCY 08/18/2010  . SPONDYLOSIS, LUMBAR 01/17/2009  . Dyslipidemia 11/26/2007  . Morbid obesity (HCC) 11/26/2007  . Essential hypertension 11/25/2007    PCP: Ardith Dark, MD  REFERRING PROVIDER: Ardith Dark, MD  REFERRING DIAG: W19.XXXD (ICD-10-CM) - Fall, subsequent encounter  THERAPY DIAG:  Muscle weakness (generalized)  History of falling  Other lack of coordination  Rationale for Evaluation and Treatment: Rehabilitation  ONSET DATE:  October 2024  SUBJECTIVE:                                                                                                                                                                                  SUBJECTIVE STATEMENT:  MD visit/PT order 11/1  Pt had 2 falls approx 2 months ago.  Pt fell when she was descending her 2 steps off her porch.  About 1 week later, she fell in her bedroom.  She doesn't know what happened, but she just fell.  Pt states she injured her R foot and L thigh on the  1st fall.  Pt states she has frequent pain in R lateral foot with ambulation.     Pt states she felt like she was tripping over her feet approx 2 months ago.  Pt doesn't feel unsteady when she is walking.  Pt goes to the gym 4-5 days per week.  She performs exercise bikes 4-5 miles and TM 1 mile.  If her foot bothers her, she doesn't use TM.  Pt states her back is much better since performing gym exercises.     PERTINENT HISTORY: Chronic LBP due to back injury at work in 2014 ; in pain management for lower back ; Lumbar DDD Chronic R shoulder pain HTN, anemia, obesity, and hyperglycemia Osteoporosis vs osteopenia   PAIN:  NPRS:  5/10 current, 6-7/10 worst, 0/10 best Location:  R lateral foot  NPRS:  6/10, 8/10 worst, 0/10 best Location:  central lumbar and thoracic Pt states her back does not typically bother her, but does today due to the weather.   PRECAUTIONS: Fall and Other: osteoporosis vs osteopenia   WEIGHT BEARING RESTRICTIONS: No  FALLS:  Has patient fallen in last 6 months? Yes. Number of falls 2   OCCUPATION: Pt is retired  PLOF: Independent  PATIENT GOALS: improve strength in LE's   OBJECTIVE:  Note: Objective measures were completed at Evaluation unless otherwise noted.  DIAGNOSTIC FINDINGS: N/A  PATIENT SURVEYS:  FOTO 57 with a goal of 26 at visit 9  COGNITION: Overall cognitive status: Within functional limits for tasks assessed       LOWER EXTREMITY  MMT:  MMT Right eval Left eval  Hip flexion 5/5 5/5  Hip extension    Hip abduction 3+/5 4-/5  Hip adduction    Hip internal rotation    Hip external rotation    Knee flexion 5/5 seated 5/5 seated  Knee extension 4/5 4/5  Ankle dorsiflexion 5/5 5/5  Ankle plantarflexion WFL seated WFL seated  Ankle inversion    Ankle eversion     (Blank rows = not tested)   FUNCTIONAL TESTS:  5x STS test:  16.8 without Ue's.  TUG:  7.57 sec Balance:   Romberg standing:  1 min without LOB Standing with FA with EC:  20 sec without LOB Tandem stance:  R:  10 sec, L:  30 sec SLS:  Unable to perform  GAIT: Assistive device utilized: None Level of assistance: Complete Independence Comments:  Pt ambulated without an AD without any LOB.  Very limited reciprocal arm swing.   TODAY'S TREATMENT:                                                                                                                                PATIENT EDUCATION:  Education details: dx, objective findings, rationale of exercises, POC, and relevant anatomy.  PT answered pt's questions.  Person educated: Patient Education method: Explanation Education comprehension: verbalized understanding  HOME EXERCISE PROGRAM: Will give next visit  ASSESSMENT:  CLINICAL IMPRESSION: Patient is a 67 y.o. female with a dx of fall.  Pt had 2 falls approx 2 months ago reports she felt like she was tripping over her feet approx 2 months ago.  Pt states she has frequent pain in R lateral foot with ambulation.  Pt currently doesn't feel unsteady when she is walking.  Pt has chronic back pain and receives pain management.  She has been going to the gym regularly and reports her back has been feeling much better.  Pt has weakness in bilat LE's.  Pt had a good score on the TUG test though required increased time on 5x STS test.  Pt also had balance deficits as noted with tandem stance with R LE back.  Pt should benefit from skilled PT services  to address impairments and improve overall function.      OBJECTIVE IMPAIRMENTS: decreased balance, decreased mobility, decreased strength, and pain.   ACTIVITY LIMITATIONS: squatting and stairs  PARTICIPATION LIMITATIONS: community activity  PERSONAL FACTORS: 1-2 comorbidities: chronic LBP and obesity  are also affecting patient's functional outcome.   REHAB POTENTIAL: Good  CLINICAL DECISION MAKING: Stable/uncomplicated  EVALUATION COMPLEXITY: Low   GOALS:   SHORT TERM GOALS: Target date: 10/28/2023  Pt will be independent and compliant with HEP for improved strength, balance, and function.  Baseline: Goal status: INITIAL  2.  Pt will be able to perform tandem stance x 30 sec with R LE back for improved balance and proprioception.   Baseline:  Goal status: INITIAL  3.  *** Baseline:  Goal status: INITIAL  4.  *** Baseline:  Goal status: INITIAL  5.  *** Baseline:  Goal status: INITIAL  6.  *** Baseline:  Goal status: INITIAL  LONG TERM GOALS: Target date: 11/18/2023  Pt will deny having any falls.  Baseline:  Goal status: INITIAL  2.  Pt will report she has good stability with all of her daily activities and functional mobility.  Baseline:  Goal status: INITIAL  3.  Pt will demo improved 5x STS by 4 sec for improved functional LE strength and  Baseline:  Goal status: INITIAL  4.  *** Baseline:  Goal status: INITIAL  5.  *** Baseline:  Goal status: INITIAL  6.  *** Baseline:  Goal status: INITIAL   PLAN:  PT FREQUENCY: 1-2x/week  PT DURATION: 6 weeks  PLANNED INTERVENTIONS: 97164- PT Re-evaluation, 97110-Therapeutic exercises, 97530- Therapeutic activity, [id number]- Neuromuscular re-education, 97535- Self Care, 91478- Manual therapy, [id number]- Gait training, [id number]- Aquatic Therapy, 97014- Electrical stimulation (unattended), 97035- Ultrasound, Patient/Family education, Balance training, Stair training, Taping, Dry Needling, Cryotherapy, and Moist  heat  PLAN FOR NEXT SESSION: Cont with balance and strengthening.  Establish HEP.  Date of referral: *** Referring provider: *** Referring diagnosis? *** Treatment diagnosis? (if different than referring diagnosis) ***  What was this (referring dx) caused by? {Cause of Referring Diagnosis:30897}  Ashby Dawes of Condition: {Nature of Condition:30889}   Laterality: {Laterality:30890}  Current Functional Measure Score: {Functional Measure Score:30891}  Objective measurements identify impairments when they are compared to normal values, the uninvolved extremity, and prior level of function.  []  Yes  []  No  Objective assessment of functional ability: {assessment of functional ability:30892}   Briefly describe symptoms: ***  How did symptoms start: ***  Average pain intensity:  Last 24 hours: ***  Past week: ***  How often does the pt experience symptoms? {Frequency of symptoms:30893}  How much have the symptoms interfered with usual daily activities? {Interfere  with ZOXW:96045}  How has condition changed since care began at this facility? {Condition Changed:30895}  In general, how is the patients overall health? {Overall Health:30896}   BACK PAIN (STarT Back Screening Tool) {BACK WUJW:11914}    Aaron Edelman, PT 10/08/2023, 10:29 PM

## 2023-10-10 DIAGNOSIS — L089 Local infection of the skin and subcutaneous tissue, unspecified: Secondary | ICD-10-CM | POA: Diagnosis not present

## 2023-10-10 DIAGNOSIS — L6681 Central centrifugal cicatricial alopecia: Secondary | ICD-10-CM | POA: Diagnosis not present

## 2023-10-10 DIAGNOSIS — L658 Other specified nonscarring hair loss: Secondary | ICD-10-CM | POA: Diagnosis not present

## 2023-10-15 ENCOUNTER — Ambulatory Visit (HOSPITAL_BASED_OUTPATIENT_CLINIC_OR_DEPARTMENT_OTHER): Payer: Medicare Other | Admitting: Physical Therapy

## 2023-10-25 ENCOUNTER — Encounter (HOSPITAL_BASED_OUTPATIENT_CLINIC_OR_DEPARTMENT_OTHER): Payer: Self-pay

## 2023-10-30 ENCOUNTER — Encounter (HOSPITAL_BASED_OUTPATIENT_CLINIC_OR_DEPARTMENT_OTHER): Payer: Medicare Other | Admitting: Physical Therapy

## 2023-10-30 ENCOUNTER — Other Ambulatory Visit: Payer: Self-pay | Admitting: Family Medicine

## 2023-10-31 ENCOUNTER — Telehealth: Payer: Self-pay | Admitting: Family Medicine

## 2023-10-31 NOTE — Telephone Encounter (Signed)
 Left message to return call to our office at their convenience.

## 2023-10-31 NOTE — Telephone Encounter (Signed)
 Patient is needing to know what dosage of medication she needs to take for vitamin d3

## 2023-10-31 NOTE — Telephone Encounter (Signed)
 Spoke with patient advise to continue with current dose of Vit D, patient verbalized understanding

## 2023-11-05 ENCOUNTER — Ambulatory Visit (HOSPITAL_BASED_OUTPATIENT_CLINIC_OR_DEPARTMENT_OTHER): Payer: Medicare Other | Attending: Family Medicine | Admitting: Physical Therapy

## 2023-11-05 ENCOUNTER — Encounter (HOSPITAL_BASED_OUTPATIENT_CLINIC_OR_DEPARTMENT_OTHER): Payer: Self-pay | Admitting: Physical Therapy

## 2023-11-05 DIAGNOSIS — Z9181 History of falling: Secondary | ICD-10-CM | POA: Diagnosis not present

## 2023-11-05 DIAGNOSIS — M6281 Muscle weakness (generalized): Secondary | ICD-10-CM | POA: Insufficient documentation

## 2023-11-05 DIAGNOSIS — R278 Other lack of coordination: Secondary | ICD-10-CM | POA: Insufficient documentation

## 2023-11-05 NOTE — Therapy (Signed)
 OUTPATIENT PHYSICAL THERAPY TREATMENT   Patient Name: Susan [name] MRN: [id number] DOB:1956-05-25, 68 y.o., female Today's Date: 11/05/2023  END OF SESSION:  PT End of Session - 11/05/23      Visit Number 2    Number of Visits 9    Date for PT Re-Evaluation 11/18/23    Authorization Type UHC MCR    PT Start Time 88845    PT Stop Time 1238    PT Time Calculation (min) 44 min    Activity Tolerance Patient tolerated treatment well    Behavior During Therapy WFL for tasks assessed/performed             Past Medical History:  Diagnosis Date   Allergy     Anemia    Anxiety state, unspecified    Back injury    HLD (hyperlipidemia)    intol of statins - follows with lipid clinic   HTN (hypertension)    Irritable bowel syndrome    Lumbosacral spondylosis without myelopathy    improved with PT   Morbid obesity (HCC)    Past Surgical History:  Procedure Laterality Date   ABSCESS DRAINAGE     abdominal   CESAREAN SECTION  1977   Patient Active Problem List   Diagnosis Date Noted   History of colonic polyps 02/19/2023   Mixed hyperlipidemia 02/19/2023   Allergic rhinitis 02/23/2022   Bee sting allergy  02/23/2022   Constipation 02/23/2022   Hyperglycemia 11/15/2021   Fibroids 01/30/2021   Osteoporosis with last DEXA 02/2020 03/15/2020   Alopecia 08/21/2019   Hemorrhoid 08/21/2019   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   Skin inflammation 04/21/2015   VENOUS INSUFFICIENCY 08/18/2010   SPONDYLOSIS, LUMBAR 01/17/2009   Dyslipidemia 11/26/2007   Morbid obesity (HCC) 11/26/2007   Essential hypertension 11/25/2007    PCP: Kennyth Worth HERO, MD  REFERRING PROVIDER: Kennyth Worth HERO, MD  REFERRING DIAG: W19.XXXD (ICD-10-CM) - Fall, subsequent encounter  THERAPY DIAG:  Muscle weakness (generalized)  History of falling  Other lack of coordination  Rationale for Evaluation and Treatment: Rehabilitation  ONSET DATE: October 2024  SUBJECTIVE:                                                                                                                                                                                   SUBJECTIVE STATEMENT:  Pt states she had to cancel her prior visit due to traveling to [place] .  Pt is going to [employer] 4-5x/wk.  Pt used the recumbent bike for almost 5 miles and walked on TM for 1 mile this AM.  Pt denies any adverse effects after prior Rx.  Pt states her balance is better.  She tripped over  something in her bedroom, but didn't fall.  Pt states her legs are stronger than last time.  Pt is trying to get up every 25 mins.      PERTINENT HISTORY: Chronic LBP due to back injury at work in 2014 ; in pain management for lower back ; Lumbar DDD Chronic R shoulder pain HTN, anemia, obesity, and hyperglycemia Osteoporosis vs osteopenia   PAIN:  NPRS:  1/10 current, 6-7/10 worst, 0/10 best Location:  R lateral foot  NPRS:  0/10, 8/10 worst, 0/10 best Location:  central lumbar and thoracic Pt states her back does not typically bother her, but does today due to the weather.   PRECAUTIONS: Fall and Other: osteoporosis vs osteopenia   WEIGHT BEARING RESTRICTIONS: No  FALLS:  Has patient fallen in last 6 months? Yes. Number of falls 2   OCCUPATION: Pt is retired  PLOF: Independent  PATIENT GOALS: improve strength in LE's   OBJECTIVE:  Note: Objective measures were completed at Evaluation unless otherwise noted.  DIAGNOSTIC FINDINGS: N/A    TODAY'S TREATMENT:                                                                                                                               Reviewed current function, response to prior Rx, and pain levels.  Standing with FT x 1 mins Tandem stance R: x 30 sec with 1 occasion of UE assist and 30 sec without UE support, L: 2x30 sec Sidestepping with bilat UE support F/B sway with and without UE support 2x10-12 Standing on airex with FA and FT x 30 sec each Marching on  airex 2x10 with UE support   Seated clams with RTB x 20, GTB 2x10 S/L hip abd x 10 bilat LAQ 2x10 bilat with RTB  Pt received a HEP handout and was educated in correct form and appropriate frequency.  PT instructed pt to perform balance exercises with external support such as a counter or table in front of her.    PATIENT EDUCATION:  Education details: dx, HEP, exercise form, rationale of exercises, POC, and relevant anatomy.  PT answered pt's questions.  Person educated: Patient Education method: Explanation, demonstration, verbal cues, handout Education comprehension: verbalized understanding, returned demonstration, verbal cues required, needs further instruction  HOME EXERCISE PROGRAM: Access Code: [email or web address] URL: [email or web address] Date: 11/05/2023 Prepared by: Mose Minerva  Exercises - Romberg Stance  - 1 x daily - 7 x weekly - 2 reps - 45 seconds - 1 min hold - Standing Tandem Balance with Counter Support  - 1 x daily - 7 x weekly - 2 reps - 20-30 seconds hold - Forward Backward Weight Shift with Counter Support  - 1 x daily - 5-7 x weekly - 2 sets - 10 reps - Seated Hip Abduction with Resistance  - 1 x daily - 4 x weekly - 2 sets - 10 reps - Seated Knee Extension with Resistance  - 1 x daily -  3-4 x weekly - 2 sets - 10 reps  ASSESSMENT:  CLINICAL IMPRESSION: Patient hasn't been seen in PT since 12/16 due to traveling to [place] .  Pt states her balance is better and feels that her legs are stronger.  PT performed exercises to improve balance and LE strength.  She performed exercises well with cuing and instruction in correct form.  PT monitored pt's back pain and she tolerated exercises well.  Pt demonstrates improved performance of tandem stance with R LE back.  PT established HEP and gave pt a HEP handout.  Pt demonstrates good understanding of HEP.  Pt responded well to Rx having no c/o's after Rx.  Pt should benefit from continued skilled PT to address  impairments and goals and improve overall function.      OBJECTIVE IMPAIRMENTS: decreased balance, decreased mobility, decreased strength, and pain.   ACTIVITY LIMITATIONS: squatting and stairs  PARTICIPATION LIMITATIONS: community activity  PERSONAL FACTORS: 1-2 comorbidities: chronic LBP and obesity  are also affecting patient's functional outcome.   REHAB POTENTIAL: Good  CLINICAL DECISION MAKING: Stable/uncomplicated  EVALUATION COMPLEXITY: Low   GOALS:   SHORT TERM GOALS: Target date: 10/28/2023  Pt will be independent and compliant with HEP for improved strength, balance, and function.  Baseline: Goal status: INITIAL  2.  Pt will be able to perform tandem stance x 30 sec with R LE back for improved balance and proprioception.   Baseline:  Goal status: INITIAL    LONG TERM GOALS: Target date: 11/18/2023  Pt will deny having any falls.  Baseline:  Goal status: INITIAL  2.  Pt will report she has good stability with all of her daily activities and functional mobility.  Baseline:  Goal status: INITIAL  3.  Pt will demo improved 5x STS test by 4 sec for improved functional LE strength and decreased fall risk.  Baseline:  Goal status: INITIAL  4.  Pt will demo improved bilat LE strength to 4+/5 MMT in hip abd and 5/5 in knee extension for improved performance of functional mobility.   Baseline:  Goal status: INITIAL    PLAN:  PT FREQUENCY: 1-2x/week  PT DURATION: 6 weeks  PLANNED INTERVENTIONS: 97164- PT Re-evaluation, 97110-Therapeutic exercises, 97530- Therapeutic activity, [id number]- Neuromuscular re-education, 97535- Self Care, [id number]- Manual therapy, [id number]- Gait training, [phone]- Aquatic Therapy, 97014- Electrical stimulation (unattended), 97035- Ultrasound, Patient/Family education, Balance training, Stair training, Taping, Dry Needling, Cryotherapy, and Moist heat  PLAN FOR NEXT SESSION: Cont with balance and strengthening.  Establish HEP.  Leigh Minerva  III PT, DPT 11/05/23 1:39 PM

## 2023-11-11 ENCOUNTER — Ambulatory Visit: Payer: Self-pay | Admitting: Family Medicine

## 2023-11-11 NOTE — Telephone Encounter (Signed)
FYI, see Triage note, pt scheduled 11/14/2023.

## 2023-11-11 NOTE — Telephone Encounter (Signed)
  Chief Complaint: R foot pain Symptoms: pain Frequency: 2 months, worsening today Pertinent Negatives: Patient denies numbness, tingling, redness, swelling Disposition: [] ED /[] Urgent Care (no appt availability in office) / [x] Appointment(In office/virtual)/ []  [facility]/ [] Home Care/ [] Refused Recommended Disposition /[] [facility]/ []  Follow-up with PCP Additional Notes: Patient called reporting increased R foot pain after fall approx 2 months ago. Reports she has been completing PT as ordered, but today her pain is increased. States that pain is relieved some when she takes tylenol. Denies numbness, tingling, redness, swelling. Per protocol, patient to be evaluated within 3 days. Scheduled at patient request for 11/14/23 @ 1320. Care advice reviewed with patient, understanding verbalized. Denies further questions at this time. Alerting PCP for review.    Copied from CRM [id number]. Topic: Clinical - Red Word Triage >> Nov 11, 2023  2:03 PM Gurney Maxin H wrote: [facility] that prompted transfer to Nurse Triage: Larey Seat 2 months ago, last couple days right foot hurting throbbing like a toothache. Reason for Disposition  [1] MODERATE pain (e.g., interferes with normal activities, limping) AND [2] present > 3 days  Answer Assessment - Initial Assessment Questions 1. ONSET: "When did the pain start?"      Two months after falling down stairs 2. LOCATION: "Where is the pain located?"      R foot, R side and top 3. PAIN: "How bad is the pain?"    (Scale 1-10; or mild, moderate, severe)  - MILD (1-3): doesn't interfere with normal activities.   - MODERATE (4-7): interferes with normal activities (e.g., work or school) or awakens from sleep, limping.   - SEVERE (8-10): excruciating pain, unable to do any normal activities, unable to walk.      10/10 4. WORK OR EXERCISE: "Has there been any recent work or exercise that involved this part of the body?"      Physical therapy 5.  CAUSE: "What do you think is causing the foot pain?"     Had a fall approx 2 months ago- was evaluated but pain is not improving. 6. OTHER SYMPTOMS: "Do you have any other symptoms?" (e.g., leg pain, rash, fever, numbness)     Denies  Protocols used: Foot Pain-A-AH

## 2023-11-13 ENCOUNTER — Ambulatory Visit (HOSPITAL_BASED_OUTPATIENT_CLINIC_OR_DEPARTMENT_OTHER): Payer: Medicare Other | Admitting: Physical Therapy

## 2023-11-14 ENCOUNTER — Encounter: Payer: Self-pay | Admitting: Family Medicine

## 2023-11-14 ENCOUNTER — Ambulatory Visit: Payer: Medicare Other

## 2023-11-14 ENCOUNTER — Ambulatory Visit: Payer: Medicare Other | Admitting: Family Medicine

## 2023-11-14 VITALS — BP 142/77 | HR 58 | Temp 97.3°F | Ht 59.2 in | Wt 199.2 lb

## 2023-11-14 DIAGNOSIS — M79671 Pain in right foot: Secondary | ICD-10-CM | POA: Diagnosis not present

## 2023-11-14 DIAGNOSIS — M19071 Primary osteoarthritis, right ankle and foot: Secondary | ICD-10-CM | POA: Diagnosis not present

## 2023-11-14 DIAGNOSIS — M85871 Other specified disorders of bone density and structure, right ankle and foot: Secondary | ICD-10-CM | POA: Diagnosis not present

## 2023-11-14 DIAGNOSIS — I10 Essential (primary) hypertension: Secondary | ICD-10-CM | POA: Diagnosis not present

## 2023-11-14 MED ORDER — PREDNISONE 50 MG PO TABS: ORAL_TABLET | ORAL | 0 refills | Status: DC

## 2023-11-14 NOTE — Patient Instructions (Addendum)
It was very nice to see you today!  We will check an x-ray of your foot.  Please start the prednisone.  Use ice to the area.  Let us know if not improving in the next week or 2.  Return if symptoms worsen or fail to improve.   Take care, Dr Jimmey Ralph  PLEASE NOTE:  If you had any lab tests, please let us know if you have not heard back within a few days. You may see your results on mychart before we have a chance to review them but we will give you a call once they are reviewed by [place].   If we ordered any referrals today, please let us know if you have not heard from their office within the next week.   If you had any urgent prescriptions sent in today, please check with the pharmacy within an hour of our visit to make sure the prescription was transmitted appropriately.   Please try these tips to maintain a healthy lifestyle:  Eat at least 3 REAL meals and 1-2 snacks per day.  Aim for no more than 5 hours between eating.  If you eat breakfast, please do so within one hour of getting up.   Each meal should contain half fruits/vegetables, one quarter protein, and one quarter carbs (no bigger than a computer mouse)  Cut down on sweet beverages. This includes juice, soda, and sweet tea.   Drink at least 1 glass of water with each meal and aim for at least 8 glasses per day  Exercise at least 150 minutes every week.

## 2023-11-14 NOTE — Assessment & Plan Note (Signed)
Mildly elevated today.  Typically well-controlled.  She is well-controlled at last office visit here.  We will continue spironolactone 25 mg daily.  She will continue to monitor at home and let us know if persistently elevated.

## 2023-11-14 NOTE — Progress Notes (Signed)
   Susan [name] is a 68 y.o. female who presents today for an office visit.  Assessment/Plan:  New/Acute Problems: Right Foot Pain  She has quite a bit of tenderness over base of right fifth metatarsal.  Will check plain films today given length of symptoms and her fall a couple of months ago.  Did discuss that she may have had a sprain to the area that is not yet healed.  We discussed home exercise program.  She cannot take NSAIDs due to prior history of GI bleeding.  Will start prednisone.  Also check plain films today.  If not improving in the next 1 to 2 weeks would consider referral back to PT or sports medicine depending on results of today's x-ray.  We discussed reasons to return to care.  Chronic Problems Addressed Today: Essential hypertension Mildly elevated today.  Typically well-controlled.  She is well-controlled at last office visit here.  We will continue spironolactone 25 mg daily.  She will continue to monitor at home and let us know if persistently elevated.     Subjective:  HPI:  See A/P for status of chronic conditions.  Patient is here today with 2 months of foot pain.  I saw her a couple months ago for frequent falls.  I referred her to physical therapy.  She has seen them.  Since our last visit she has had continued pain in her right foot.  This comes and goes.  Worsened recently.  Located on outer aspect of right foot.  She has not had any other recent injuries or other precipitating events.  Takes Tylenol with some improvement.  No other treatments tried.        Objective:  Physical Exam: BP (!) 142/77   Pulse (!) 58   Temp (!) 97.3 F (36.3 C) (Temporal)   Ht 4' 11.2" (1.504 m)   Wt 199 lb 3.2 oz (90.4 kg)   SpO2 100%   BMI 39.96 kg/m   Gen: No acute distress, resting comfortably CV: Regular rate and rhythm with no murmurs appreciated Pulm: Normal work of breathing, clear to auscultation bilaterally with no crackles, wheezes, or  rhonchi MUSCULOSKELETAL: - Right Foot: No deformities.  Tender to palpation at base of fifth metatarsal.  Neurovascular intact distally. Neuro: Grossly normal, moves all extremities Psych: Normal affect and thought content      Kamryn Gauthier M. Jimmey Ralph, MD 11/14/2023 1:47 PM

## 2023-11-18 ENCOUNTER — Encounter (HOSPITAL_BASED_OUTPATIENT_CLINIC_OR_DEPARTMENT_OTHER): Payer: Self-pay | Admitting: Physical Therapy

## 2023-11-18 ENCOUNTER — Ambulatory Visit (HOSPITAL_BASED_OUTPATIENT_CLINIC_OR_DEPARTMENT_OTHER): Payer: Medicare Other | Admitting: Physical Therapy

## 2023-11-18 DIAGNOSIS — M6281 Muscle weakness (generalized): Secondary | ICD-10-CM | POA: Diagnosis not present

## 2023-11-18 DIAGNOSIS — R278 Other lack of coordination: Secondary | ICD-10-CM | POA: Diagnosis not present

## 2023-11-18 DIAGNOSIS — Z9181 History of falling: Secondary | ICD-10-CM

## 2023-11-18 NOTE — Therapy (Signed)
OUTPATIENT PHYSICAL THERAPY TREATMENT   Patient Name: Susan [name] MRN: [id number] DOB:1956-09-08, 68 y.o., female Today's Date: 11/18/2023  END OF SESSION:  PT End of Session - 11/05/23      Visit Number 2    Number of Visits 9    Date for PT Re-Evaluation 11/18/23    Authorization Type UHC MCR    PT Start Time 52841    PT Stop Time 1238    PT Time Calculation (min) 44 min    Activity Tolerance Patient tolerated treatment well    Behavior During Therapy WFL for tasks assessed/performed             Past Medical History:  Diagnosis Date   Allergy    Anemia    Anxiety state, unspecified    Back injury    HLD (hyperlipidemia)    intol of statins - follows with lipid clinic   HTN (hypertension)    Irritable bowel syndrome    Lumbosacral spondylosis without myelopathy    improved with PT   Morbid obesity (HCC)    Past Surgical History:  Procedure Laterality Date   ABSCESS DRAINAGE     abdominal   CESAREAN SECTION  1977   Patient Active Problem List   Diagnosis Date Noted   History of colonic polyps 02/19/2023   Mixed hyperlipidemia 02/19/2023   Allergic rhinitis 02/23/2022   Bee sting allergy 02/23/2022   Constipation 02/23/2022   Hyperglycemia 11/15/2021   Fibroids 01/30/2021   Osteoporosis with last DEXA 02/2020 03/15/2020   Alopecia 08/21/2019   Hemorrhoid 08/21/2019   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  Rationale for Evaluation and Treatment: Rehabilitation  ONSET DATE: October 2024  SUBJECTIVE:                                                                                                                                                                                   SUBJECTIVE STATEMENT:  Pt states her R foot and back have been bothering her.  She saw MD concerning back and foot and had x rays on R foot.  Pt has been taking prednisone and has 1 more day of prednisone.  Pt states she went to the gym 5 times last week and felt ok with gym exercises.  Pt reports she hurt a little bit after prior Rx in her  back and foot.  Pt states she has done some of her exercises though not all of them consistently due to pain.  Pt reports her balance is much better.  She hasn't had any falls.  Pt has been extremely careful.  Pt states her legs are getting stronger.  Pt states she did a lot of walking this AM due to shopping.  Pt reports she has a lot more energy after the gym and her balance is a lot better.        PERTINENT HISTORY: Chronic LBP due to back injury at work in 2014 ; in pain management for lower back ; Lumbar DDD Chronic R shoulder pain HTN, anemia, obesity, and hyperglycemia Osteoporosis vs osteopenia   PAIN:  NPRS:  1/10 current, 6-7/10 worst, 0/10 best Location:  R lateral foot  NPRS:  0/10, 8/10 worst, 0/10 best Location:  central lumbar and thoracic Pt states her back does not typically bother her, but does today due to the weather.   PRECAUTIONS: Fall and Other: osteoporosis vs osteopenia   WEIGHT BEARING RESTRICTIONS: No  FALLS:  Has patient fallen in last 6 months? Yes. Number of falls 2   OCCUPATION: Pt is retired  PLOF: Independent  PATIENT GOALS: improve strength in LE's   OBJECTIVE:  Note: Objective measures were completed at Evaluation unless otherwise noted.  DIAGNOSTIC FINDINGS: N/A    TODAY'S TREATMENT:                                                                                                                               Reviewed current function, response to prior Rx, and pain levels.  Tandem stance:  30 sec bilat without UE  support 5x STS test:  Initial/Current:  16.8 without UE's. / 10.12 without Ue's  LOWER EXTREMITY MMT:   MMT Right eval Left eval Right 1/27 Left 1/27  Hip flexion 5/5 5/5    Hip extension        Hip abduction 3+/5 4-/5 3+/5 4-/5  Hip adduction        Hip internal rotation        Hip external rotation        Knee flexion 5/5 seated 5/5 seated    Knee extension 4/5 4/5 4+/5 4+/5  Ankle dorsiflexion 5/5 5/5    Ankle plantarflexion WFL seated WFL seated    Ankle inversion        Ankle eversion         (Blank rows = not tested)   PATIENT SURVEYS:  FOTO INITIAL / CURRENT:  72 / 73 with a goal of 68 at visit 9   PT answered pt's questions concerning gym program and POC.    Standing with FT x 1 mins Tandem stance R: x 30 sec with 1 occasion of UE assist and 30 sec without UE support, L: 2x30 sec Sidestepping with bilat UE support F/B sway with and without UE support 2x10-12 Standing  on airex with FA and FT x 30 sec each Marching on airex 2x10 with UE support   Seated clams with RTB x 20, GTB 2x10 S/L hip abd x 10 bilat LAQ 2x10 bilat with RTB  Pt received a HEP handout and was educated in correct form and appropriate frequency.  PT instructed pt to perform balance exercises with external support such as a counter or table in front of her.    PATIENT EDUCATION:  Education details: dx, HEP, exercise form, rationale of exercises, POC, and relevant anatomy.  PT answered pt's questions.  Person educated: Patient Education method: Explanation, demonstration, verbal cues, handout Education comprehension: verbalized understanding, returned demonstration, verbal cues required, needs further instruction  HOME EXERCISE PROGRAM: Access Code: [email or web address] URL: [email or web address] Date: 11/05/2023 Prepared by: Aaron Edelman  Exercises - Romberg Stance  - 1 x daily - 7 x weekly - 2 reps - 45 seconds - 1 min hold - Standing Tandem Balance with Counter Support  - 1 x  daily - 7 x weekly - 2 reps - 20-30 seconds hold - Forward Backward Weight Shift with Counter Support  - 1 x daily - 5-7 x weekly - 2 sets - 10 reps - Seated Hip Abduction with Resistance  - 1 x daily - 4 x weekly - 2 sets - 10 reps - Seated Knee Extension with Resistance  - 1 x daily - 3-4 x weekly - 2 sets - 10 reps  ASSESSMENT:  CLINICAL IMPRESSION: Patient hasn't been seen in PT since 12/16 due to traveling to [place].  Pt states her balance is better and feels that her legs are stronger.  PT performed exercises to improve balance and LE strength.  She performed exercises well with cuing and instruction in correct form.  PT monitored pt's back pain and she tolerated exercises well.  Pt demonstrates improved performance of tandem stance with R LE back.  PT established HEP and gave pt a HEP handout.  Pt demonstrates good understanding of HEP.  Pt responded well to Rx having no c/o's after Rx.  Pt should benefit from continued skilled PT to address impairments and goals and improve overall function.      Pt demonstrates improved functional LE strength and performance of transfers with improving 5x STS test by 6 seconds.  Pt had no improvement in hip abduction though did improve with knee extension strength.    OBJECTIVE IMPAIRMENTS: decreased balance, decreased mobility, decreased strength, and pain.   ACTIVITY LIMITATIONS: squatting and stairs  PARTICIPATION LIMITATIONS: community activity  PERSONAL FACTORS: 1-2 comorbidities: chronic LBP and obesity  are also affecting patient's functional outcome.   REHAB POTENTIAL: Good  CLINICAL DECISION MAKING: Stable/uncomplicated  EVALUATION COMPLEXITY: Low   GOALS:   SHORT TERM GOALS: Target date: 10/28/2023  Pt will be independent and compliant with HEP for improved strength, balance, and function.  Baseline: Goal status: INITIAL  2.  Pt will be able to perform tandem stance x 30 sec with R LE back for improved balance and proprioception.    Baseline:  Goal status: INITIAL    LONG TERM GOALS: Target date: 11/18/2023  Pt will deny having any falls.  Baseline:  Goal status:  GOAL MET   1/27  2.  Pt will report she has good stability with all of her daily activities and functional mobility.  Baseline:  Goal status: GOAL MET  1/27  3.  Pt will demo improved 5x STS test by 4 sec for improved functional LE strength and  decreased fall risk.  Baseline:  Goal status: GOAL MET  1/27  4.  Pt will demo improved bilat LE strength to 4+/5 MMT in hip abd and 5/5 in knee extension for improved performance of functional mobility.   Baseline:  Goal status: INITIAL    PLAN:  PT FREQUENCY: 1-2x/week  PT DURATION: 6 weeks  PLANNED INTERVENTIONS: 97164- PT Re-evaluation, 97110-Therapeutic exercises, 97530- Therapeutic activity, [id number]- Neuromuscular re-education, 97535- Self Care, 16109- Manual therapy, [id number]- Gait training, [phone]- Aquatic Therapy, 97014- Electrical stimulation (unattended), 97035- Ultrasound, Patient/Family education, Balance training, Stair training, Taping, Dry Needling, Cryotherapy, and Moist heat  PLAN FOR NEXT SESSION: Cont with balance and strengthening.  Establish HEP.  Audie Clear III PT, DPT 11/18/23 3:01 PM

## 2023-11-21 ENCOUNTER — Encounter: Payer: Self-pay | Admitting: Family Medicine

## 2023-11-21 NOTE — Progress Notes (Signed)
Her x-ray does not show any fracture.  She probably had a sprain to the area.  She does have some arthritis in her feet which is probably causing some pain as well.  She should let us know if pain is not improving and we can refer her to see the sports medicine group.

## 2023-11-26 ENCOUNTER — Ambulatory Visit (INDEPENDENT_AMBULATORY_CARE_PROVIDER_SITE_OTHER): Payer: Medicare Other | Admitting: Physician Assistant

## 2023-11-26 ENCOUNTER — Encounter: Payer: Self-pay | Admitting: Physician Assistant

## 2023-11-26 VITALS — BP 140/80 | HR 70 | Ht 59.0 in | Wt 202.0 lb

## 2023-11-26 DIAGNOSIS — M810 Age-related osteoporosis without current pathological fracture: Secondary | ICD-10-CM | POA: Diagnosis not present

## 2023-11-26 DIAGNOSIS — Z860101 Personal history of adenomatous and serrated colon polyps: Secondary | ICD-10-CM | POA: Diagnosis not present

## 2023-11-26 DIAGNOSIS — Z6841 Body Mass Index (BMI) 40.0 and over, adult: Secondary | ICD-10-CM

## 2023-11-26 DIAGNOSIS — I10 Essential (primary) hypertension: Secondary | ICD-10-CM | POA: Diagnosis not present

## 2023-11-26 DIAGNOSIS — E785 Hyperlipidemia, unspecified: Secondary | ICD-10-CM

## 2023-11-26 DIAGNOSIS — E669 Obesity, unspecified: Secondary | ICD-10-CM | POA: Diagnosis not present

## 2023-11-26 DIAGNOSIS — Z1211 Encounter for screening for malignant neoplasm of colon: Secondary | ICD-10-CM

## 2023-11-26 NOTE — Progress Notes (Signed)
 Agree with assessment and plan as outlined.

## 2023-11-26 NOTE — Patient Instructions (Addendum)
_______________________________________________________  If your blood pressure at your visit was 140/90 or greater, please contact your primary care physician to follow up on this.  _______________________________________________________  If you are age 69 or older, your body mass index should be between 23-30. Your Body mass index is 40.8 kg/m. If this is out of the aforementioned range listed, please consider follow up with your Primary Care Provider.  If you are age 21 or younger, your body mass index should be between 19-25. Your Body mass index is 40.8 kg/m. If this is out of the aformentioned range listed, please consider follow up with your Primary Care Provider.   ________________________________________________________  The [facility] providers would like to encourage you to use [facility] to communicate with providers for non-urgent requests or questions.  Due to long hold times on the telephone, sending your provider a message by [facility] may be a faster and more efficient way to get a response.  Please allow 48 business hours for a response.  Please remember that this is for non-urgent requests.  _______________________________________________________  Repeat colonoscopy for 02/2028. Please call 2 months prior to schedule this. A letter will be sent as it gets closer.  Follow up as needed but call with any questions or concerns.  It was a pleasure to see you today!  Thank you for trusting me with your gastrointestinal care!

## 2023-11-26 NOTE — Progress Notes (Signed)
 Subjective:    Patient ID: Susan [name], female    DOB: 02-23-1956, 68 y.o.   MRN: [id number]  HPI  Susan [name] is a pleasant 68 year old female, new to GI today who comes in to discuss colonoscopy.  She had called here in November 2024, wanting to transfer her care from [facility], and was concerned because she had been told there that she could not have another colonoscopy until 2029. Dr. Leigh reviewed her records.  She did have colonoscopy in 2013 with removal of 1 diminutive adenomatous polyp. She had follow-up colonoscopy in May 2019 which was negative per Dr. Dianna, other than internal hemorrhoids and she was recommended to have 10-year interval follow-up per guidelines.  She is not having any particular GI issues currently, says she does have mild constipation for which she will use Dulcolax occasionally.  She says if she eats a lot of fruit on a regular basis she will to have any problems.  She has not noticed any changes in her bowel habits, no melena or hematochezia no complaints of abdominal pain. Family history is negative for colon cancer as far she is aware.  Other medical problems include hypertension, osteoporosis, hyperlipidemia and obesity.  She wants to have another colonoscopy if its appropriate, she is trying to be proactive.  She says that her PCP Dr. Kennyth had mentioned that she may not want to wait 10 years between colonoscopies.  She asks about Cologuard.  Review of Systems Pertinent positive and negative review of systems were noted in the above HPI section.  All other review of systems was otherwise negative.   Outpatient Encounter Medications as of 11/26/2023  Medication Sig   albuterol  (VENTOLIN  HFA) 108 (90 Base) MCG/ACT inhaler Inhale 1-2 puffs by mouth into the lungs every 6 (six) hours as needed for wheezing or shortness of breath.   CALCIUM  PO Take 1 tablet by mouth once daily  Indications: osteopenia   cholecalciferol (VITAMIN D3) 25 MCG (1000  UNIT) tablet Take 5,000 Units by mouth daily.   diclofenac Sodium (VOLTAREN) 1 % GEL Apply 4 g to affected area up to 4 times daily.   EPINEPHrine  0.3 mg/0.3 mL IJ SOAJ injection Inject 0.3 mg into the muscle once as needed (anaphylaxis/allergic reaction).   ezetimibe  (ZETIA ) 10 MG tablet TAKE ONE TABLET BY MOUTH ONCE A DAY   fluocinonide  ointment (LIDEX ) 0.05 % Apply 1 Application topically 2 (two) times daily.   orphenadrine (NORFLEX) 100 MG tablet Take 100 mg by mouth as needed for muscle spasms.   POTASSIUM GLUCONATE PO Take 550 mg by mouth daily.   predniSONE  (DELTASONE ) 50 MG tablet Take 1 tablet daily for 5 days.   spironolactone  (ALDACTONE ) 25 MG tablet TAKE ONE TABLET BY MOUTH ONCE A DAY   No facility-administered encounter medications on file as of 11/26/2023.   Allergies  Allergen Reactions   Aspirin     Other reaction(s): Bleeding   Avelox  [Moxifloxacin  Hcl In Nacl] Anaphylaxis    Pt c/o swelling of her throat and tongue   Shellfish Allergy  Anaphylaxis and Hives    Rash and itching Rash and itching   Bee Venom Hives   Cat Dander Other (See Comments)   Ibuprofen Other (See Comments)    Gastric bleeding Gastric bleeding   Sulfa Antibiotics Hives   Atorvastatin      Muscle aches and memory issues   Bee Pollen     Other Reaction(s): Angioedema (ALLERGY /intolerance)   Codeine Other (See Comments)    REACTION:  causes her heart to race   Crestor  [Rosuvastatin  Calcium ]     Pt states INTOLERANT w/ muscle cramps   Penicillins Hives    REACTION: rash   Pravastatin Sodium [Pravachol]     REACTION: intolerant= headache & insomnia   Simvastatin     REACTION: intolerant= headache & insomnia   Patient Active Problem List   Diagnosis Date Noted   History of colonic polyps 02/19/2023   Mixed hyperlipidemia 02/19/2023   Allergic rhinitis 02/23/2022   Bee sting allergy  02/23/2022   Constipation 02/23/2022   Hyperglycemia 11/15/2021   Fibroids 01/30/2021   Osteoporosis with  last DEXA 02/2020 03/15/2020   Alopecia 08/21/2019   Hemorrhoid 08/21/2019   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   Skin inflammation 04/21/2015   VENOUS INSUFFICIENCY 08/18/2010   SPONDYLOSIS, LUMBAR 01/17/2009   Dyslipidemia 11/26/2007   Morbid obesity (HCC) 11/26/2007   Essential hypertension 11/25/2007   Social History   Socioeconomic History   Marital status: Divorced    Spouse name: Not on file   Number of children: 1   Years of education: college   Highest education level: Not on file  Occupational History   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   Tobacco Use   Smoking status: Never   Smokeless tobacco: Never  Vaping Use   Vaping status: Never Used  Substance and Sexual Activity   Alcohol use: No   Drug use: No   Sexual activity: Not Currently  Other Topics Concern   Not on file  Social History Narrative   Lives alone, divorced since 18   Adult dtr in [place]   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   Social Drivers of Corporate Investment Banker Strain: Low Risk  (08/22/2023)   Overall Financial Resource Strain (CARDIA)    Difficulty of Paying Living Expenses: Not hard at all  Food Insecurity: No Food Insecurity (08/22/2023)   Hunger Vital Sign    Worried About Radiation Protection Practitioner of Food in the Last Year: Never true    Ran Out of Food in the Last Year: Never true  Transportation Needs: No Transportation Needs (08/22/2023)   PRAPARE - Administrator, Civil Service (Medical): No    Lack of Transportation (Non-Medical): No  Physical Activity: Low Risk  (11/12/2023)   Received from [facility]   PCARE Exercise SDOH    Exercise: Active Lifestyle Only    PCare Exercise SDOH: Not on file    PCare Exercise SDOH: Not on file  Stress: No Stress Concern Present (08/22/2023)   [employer] of Occupational Health - Occupational Stress Questionnaire    Feeling of Stress : Not at all  Social Connections: Moderately  Isolated (08/22/2023)   Social Connection and Isolation Panel [NHANES]    Frequency of Communication with Friends and Family: More than three times a week    Frequency of Social Gatherings with Friends and Family: More than three times a week    Attends Religious Services: 1 to 4 times per year    Active Member of [employer] or Organizations: No    Attends Banker Meetings: Never    Marital Status: Divorced  Catering Manager Violence: Not At Risk (08/22/2023)   Humiliation, Afraid, Rape, and Kick questionnaire    Fear of Current or Ex-Partner: No    Emotionally Abused: No    Physically Abused: No    Sexually Abused: No  Ms. Rayo family history includes Breast cancer (age of onset: 5 - 82) in her mother; Colon cancer in her maternal grandmother; Diabetes in her father and mother; Hypertension in her mother; Kidney failure in her father; Multiple myeloma in her mother.      Objective:    Vitals:   11/26/23 1403 11/26/23 1456  BP: (!) 160/68 (!) 140/80  Pulse: 70     Physical Exam Well-developed well-nourished older African-American female in no acute distress.  Pleasant, height, Weight 202, BMI 40.8  HEENT; nontraumatic normocephalic, EOMI, PE R LA, sclera anicteric. Oropharynx; not examined today Neck; supple, no JVD Cardiovascular; regular rate and rhythm with S1-S2, no murmur rub or gallop Pulmonary; Clear bilaterally Abdomen; soft, obese, nontender, nondistended, no palpable mass or hepatosplenomegaly, bowel sounds are active Rectal; not done today Skin; benign exam, no jaundice rash or appreciable lesions Extremities; no clubbing cyanosis or edema skin warm and dry Neuro/Psych; alert and oriented x4, grossly nonfocal mood and affect appropriate .       Assessment & Plan:   #34 68 year old African-American female who comes in today to get established here, and discussed possible repeat colonoscopy. 2 prior colonoscopies, 1 in 2013 with removal of 1  diminutive tubular adenoma. Colonoscopy May 2019/[facility]/negative other than internal hemorrhoids and recommended for 10-year interval follow-up.  Patient does not have any current issues with significant changes in bowel habits, abdominal pain, melena or hematochezia. Family history is negative for colon cancer as far she is aware.  She is up-to-date with current colonoscopy screening  #2 hypertension #3.  Obesity with BMI 40 #4.  Hyperlipidemia #5.  Osteoporosis  Plan; Discussion with patient today regarding rationale for following colonoscopy surveillance guidelines, and reassured her that she is up-to-date with screening.  Advised against Cologuard, as this is not recommended if someone has had history of polyps, and management can be complicated by false positives and false negatives.  I do not think she needs to have another colonoscopy at this time, would plan for follow-up colonoscopy in May 2029.  Of course if she has issues in the interim, that colonoscopy can certainly be done sooner for problems.  She voices understanding.  She will be established with Dr. Leigh, advised we are happy to see her for any of her GI issues.  Surie Suchocki S Aiyannah Fayad PA-C 11/26/2023   Cc: Kennyth Worth HERO, MD

## 2023-11-27 ENCOUNTER — Telehealth: Payer: Self-pay

## 2023-11-27 NOTE — Telephone Encounter (Signed)
Recall entered into EPIC 

## 2023-11-27 NOTE — Telephone Encounter (Signed)
-----   Message from Mike Gip sent at 11/26/2023  5:19 PM EST ----- Regarding: recall Beth, please place recall colonoscopy for this patient with Dr. Adela Lank- May 2029  Thank you

## 2023-12-18 ENCOUNTER — Telehealth: Payer: Self-pay | Admitting: Family Medicine

## 2023-12-18 ENCOUNTER — Other Ambulatory Visit: Payer: Self-pay

## 2023-12-18 DIAGNOSIS — M79671 Pain in right foot: Secondary | ICD-10-CM

## 2023-12-18 NOTE — Telephone Encounter (Signed)
 Per last office visit note referral to sports med placed. Patient has been advised.

## 2023-12-18 NOTE — Telephone Encounter (Unsigned)
 Copied from CRM [id number]. Topic: Referral - Request for Referral >> Dec 18, 2023  1:41 PM [place] J wrote: Did the patient discuss referral with their provider in the last year? Yes (If No - schedule appointment) (If Yes - send message)  Appointment offered? No  Type of order/referral and detailed reason for visit: Orthopedic  Preference of office, provider, location: Any office  If referral order, have you been seen by this specialty before? No (If Yes, this issue or another issue? When? Where?  Can we respond through MyChart? Yes  Patient stated that right foot is having the most pain and that this was discussed with Dr. Jimmey Ralph.

## 2023-12-20 ENCOUNTER — Ambulatory Visit: Payer: Medicare Other | Admitting: Family Medicine

## 2023-12-23 ENCOUNTER — Ambulatory Visit: Payer: Medicare Other | Admitting: Family Medicine

## 2023-12-23 ENCOUNTER — Other Ambulatory Visit: Payer: Self-pay | Admitting: Family Medicine

## 2023-12-23 ENCOUNTER — Ambulatory Visit: Payer: Self-pay | Admitting: Family Medicine

## 2023-12-23 DIAGNOSIS — J4521 Mild intermittent asthma with (acute) exacerbation: Secondary | ICD-10-CM

## 2023-12-23 NOTE — Telephone Encounter (Signed)
 Copied from CRM [id number]. Topic: Clinical - Red Word Triage >> Dec 23, 2023  4:00 PM [place] J wrote: [facility] that prompted transfer to Nurse Triage: Shortness of breathe / Patient does have asthma.   Chief Complaint: SOB Symptoms: SOB, sore throat, cough (moderate), headache, congestion, a little bit of wheezing Frequency: Ongoing since Saturday Disposition: [] ED /[x] Urgent Care (no appt availability in office) / [] Appointment(In office/virtual)/ []  [facility]/ [] Home Care/ [] Refused Recommended Disposition /[] [facility]/ []  Follow-up with PCP Additional Notes: Patient is experiencing mild SOB, cough, headache, congestion, and little bit of wheezing. Patient has been drinking tea, taking throat lozenges and using inhaler. No appointments available today or tomorrow at patient's PCP office. Recommended urgent care. Patient verbalized understanding.    Answer Assessment - Initial Assessment Questions 1. RESPIRATORY STATUS: "Describe your breathing?" (e.g., wheezing, shortness of breath, unable to speak, severe coughing)      SOB mostly when lying down. Mild SOB at rest. A little bit of wheezing. Patient able to speak clearly in complete sentences.  2. ONSET: "When did this breathing problem begin?"      Saturday  3. PATTERN "Does the difficult breathing come and go, or has it been constant since it started?"      Constant  4. SEVERITY: "How bad is your breathing?" (e.g., mild, moderate, severe)    - MILD: No SOB at rest, mild SOB with walking, speaks normally in sentences, can lie down, no retractions, pulse < 100.    - MODERATE: SOB at rest, SOB with minimal exertion and prefers to sit, cannot lie down flat, speaks in phrases, mild retractions, audible wheezing, pulse 100-120.    - SEVERE: Very SOB at rest, speaks in single words, struggling to breathe, sitting hunched forward, retractions, pulse > 120      Mild SOB at rest but it worsens when lying down.  5.  RECURRENT SYMPTOM: "Have you had difficulty breathing before?" If Yes, ask: "When was the last time?" and "What happened that time?"   6. LUNG HISTORY: "Do you have any history of lung disease?"  (e.g., pulmonary embolus, asthma, emphysema)     Asthma  7. OTHER SYMPTOMS: "Do you have any other symptoms? (e.g., dizziness, runny nose, cough, chest pain, fever)     Sore throat, cough (moderate), headache, congestion, a little bit of wheezing  Protocols used: Breathing Difficulty-A-AH

## 2023-12-24 NOTE — Telephone Encounter (Signed)
 Noted.

## 2023-12-25 ENCOUNTER — Emergency Department (HOSPITAL_BASED_OUTPATIENT_CLINIC_OR_DEPARTMENT_OTHER): Admitting: Radiology

## 2023-12-25 ENCOUNTER — Other Ambulatory Visit: Payer: Self-pay

## 2023-12-25 ENCOUNTER — Emergency Department (HOSPITAL_BASED_OUTPATIENT_CLINIC_OR_DEPARTMENT_OTHER)
Admission: EM | Admit: 2023-12-25 | Discharge: 2023-12-25 | Disposition: A | Discharge status: Home / Self Care | Attending: Emergency Medicine | Admitting: Emergency Medicine

## 2023-12-25 DIAGNOSIS — J449 Chronic obstructive pulmonary disease, unspecified: Secondary | ICD-10-CM | POA: Diagnosis not present

## 2023-12-25 DIAGNOSIS — J40 Bronchitis, not specified as acute or chronic: Secondary | ICD-10-CM | POA: Insufficient documentation

## 2023-12-25 DIAGNOSIS — Z79899 Other long term (current) drug therapy: Secondary | ICD-10-CM | POA: Diagnosis not present

## 2023-12-25 DIAGNOSIS — J069 Acute upper respiratory infection, unspecified: Secondary | ICD-10-CM | POA: Diagnosis not present

## 2023-12-25 DIAGNOSIS — Z20822 Contact with and (suspected) exposure to covid-19: Secondary | ICD-10-CM | POA: Insufficient documentation

## 2023-12-25 DIAGNOSIS — Z7951 Long term (current) use of inhaled steroids: Secondary | ICD-10-CM | POA: Diagnosis not present

## 2023-12-25 DIAGNOSIS — R059 Cough, unspecified: Secondary | ICD-10-CM | POA: Diagnosis present

## 2023-12-25 DIAGNOSIS — R0602 Shortness of breath: Secondary | ICD-10-CM | POA: Diagnosis not present

## 2023-12-25 DIAGNOSIS — I10 Essential (primary) hypertension: Secondary | ICD-10-CM | POA: Diagnosis not present

## 2023-12-25 DIAGNOSIS — J45909 Unspecified asthma, uncomplicated: Secondary | ICD-10-CM | POA: Diagnosis not present

## 2023-12-25 LAB — RESP PANEL BY RT-PCR (RSV, FLU A&B, COVID)  RVPGX2
Influenza A by PCR: NEGATIVE
Influenza B by PCR: NEGATIVE
Resp Syncytial Virus by PCR: NEGATIVE
SARS Coronavirus 2 by RT PCR: NEGATIVE

## 2023-12-25 LAB — CBG MONITORING, ED: Glucose-Capillary: 57 mg/dL — ABNORMAL LOW (ref 70–99)

## 2023-12-25 MED ORDER — PREDNISONE 20 MG PO TABS: 40.0000 mg | ORAL_TABLET | Freq: Every day | ORAL | 0 refills | Status: AC

## 2023-12-25 MED ORDER — AZITHROMYCIN 250 MG PO TABS: 250.0000 mg | ORAL_TABLET | Freq: Every day | ORAL | 0 refills | Status: DC

## 2023-12-25 MED ORDER — PREDNISONE 20 MG PO TABS: 40.0000 mg | ORAL_TABLET | Freq: Every day | ORAL | 0 refills | Status: DC

## 2023-12-25 NOTE — ED Notes (Signed)
 Gave patient orange juice with peanut butter and crackers. Patient is attempting to obtain urine sample at this time.

## 2023-12-25 NOTE — ED Triage Notes (Signed)
 Pt POV from home reporting SOB, congestion, and productive cough past few days. Denies fever/chills or nvd. Hx asthma. Respirations even and unlabored in triage at this time.

## 2023-12-25 NOTE — Discharge Instructions (Signed)
 You were seen for your bronchitis in the emergency department.   At home, please take your inhaler for any wheezing.  Take the steroid that we have prescribed you to prevent worsening wheezing.  Take the antibiotics as well.    Check your MyChart online for the results of any tests that had not resulted by the time you left the emergency department.   Follow-up with your primary doctor in 2-3 days regarding your visit.    Return immediately to the emergency department if you experience any of the following: Difficulty breathing, or any other concerning symptoms.    Thank you for visiting our Emergency Department. It was a pleasure taking care of you today.

## 2023-12-25 NOTE — ED Provider Notes (Signed)
 [place] EMERGENCY DEPARTMENT AT [facility] Provider Note   CSN: [id number] Arrival date & time: 12/25/23  1435     History  Chief Complaint  Patient presents with   Cough    Susan [name] is a 68 y.o. female.  68 year old female history of asthma who presents to the emergency department with cough, congestion, and sore throat.  Patient reports that since Saturday she has been having the above symptoms.  Has been having a productive cough as well.  Does have a history of asthma and reports similar symptoms a year ago which improved with steroids and antibiotics.  Also reports sinus pressure in both of her cheeks.  No known sick contacts.  No fevers.       Home Medications Prior to Admission medications   Medication Sig Start Date End Date Taking? Authorizing Provider  albuterol (VENTOLIN HFA) 108 (90 Base) MCG/ACT inhaler Inhale 1-2 puffs by mouth into the lungs every 6 (six) hours as needed for wheezing or shortness of breath. 12/23/23   Ardith Dark, MD  azithromycin (ZITHROMAX) 250 MG tablet Take 1 tablet (250 mg total) by mouth daily. Take first 2 tablets together, then 1 every day until finished. 12/25/23   Rondel Baton, MD  CALCIUM PO Take 1 tablet by mouth once daily  Indications: osteopenia    [provider]  cholecalciferol (VITAMIN D3) 25 MCG (1000 UNIT) tablet Take 5,000 Units by mouth daily.    [provider]  diclofenac Sodium (VOLTAREN) 1 % GEL Apply 4 g to affected area up to 4 times daily. 08/24/19   [provider]  EPINEPHrine 0.3 mg/0.3 mL IJ SOAJ injection Inject 0.3 mg into the muscle once as needed (anaphylaxis/allergic reaction). 08/23/23   Ardith Dark, MD  ezetimibe (ZETIA) 10 MG tablet TAKE ONE TABLET BY MOUTH ONCE A DAY 10/30/23   Ardith Dark, MD  fluocinonide ointment (LIDEX) 0.05 % Apply 1 Application topically 2 (two) times daily.    [provider]  orphenadrine (NORFLEX) 100 MG tablet  Take 100 mg by mouth as needed for muscle spasms. 01/21/19   [provider]  POTASSIUM GLUCONATE PO Take 550 mg by mouth daily.    [provider]  predniSONE (DELTASONE) 20 MG tablet Take 2 tablets (40 mg total) by mouth daily with breakfast for 4 days. Take 1 tablet daily for 5 days. 12/25/23 12/29/23  Rondel Baton, MD  spironolactone (ALDACTONE) 25 MG tablet TAKE ONE TABLET BY MOUTH ONCE A DAY 07/17/23   Ardith Dark, MD      Allergies    Aspirin, Avelox [moxifloxacin hcl in nacl], Shellfish allergy, Bee venom, Cat dander, Ibuprofen, Sulfa antibiotics, Atorvastatin, Bee pollen, Codeine, Crestor [rosuvastatin calcium], Penicillins, Pravastatin sodium [pravachol], and Simvastatin    Review of Systems   Review of Systems  Physical Exam Updated Vital Signs BP 108/68   Pulse 76   Temp 98.6 F (37 C) (Oral)   Resp (!) 21   Ht 4' 11.5" (1.511 m)   Wt 88.9 kg   SpO2 100%   BMI 38.92 kg/m  Physical Exam Vitals and nursing note reviewed.  Constitutional:      General: She is not in acute distress.    Appearance: She is well-developed.  HENT:     Head: Normocephalic and atraumatic.     Right Ear: External ear normal.     Left Ear: External ear normal.     Nose: Congestion and rhinorrhea  present.     Mouth/Throat:     Mouth: Mucous membranes are moist.     Pharynx: Oropharynx is clear.  Eyes:     Extraocular Movements: Extraocular movements intact.     Conjunctiva/sclera: Conjunctivae normal.     Pupils: Pupils are equal, round, and reactive to light.  Cardiovascular:     Rate and Rhythm: Normal rate and regular rhythm.     Heart sounds: No murmur heard. Pulmonary:     Effort: Pulmonary effort is normal. No respiratory distress.     Breath sounds: Normal breath sounds.  Musculoskeletal:     Cervical back: Normal range of motion and neck supple.     Right lower leg: No edema.     Left lower leg: No edema.  Skin:    General: Skin is warm and dry.   Neurological:     Mental Status: She is alert and oriented to person, place, and time. Mental status is at baseline.  Psychiatric:        Mood and Affect: Mood normal.     ED Results / Procedures / Treatments   Labs (all labs ordered are listed, but only abnormal results are displayed) Labs Reviewed  CBG MONITORING, ED - Abnormal; Notable for the following components:      Result Value   Glucose-Capillary 57 (*)    All other components within normal limits  RESP PANEL BY RT-PCR (RSV, FLU A&B, COVID)  [id number]    EKG EKG Interpretation Date/Time:  Wednesday December 25 2023 15:55:50 EST Ventricular Rate:  81 PR Interval:  172 QRS Duration:  78 QT Interval:  356 QTC Calculation: 413 R Axis:   -30  Text Interpretation: Normal sinus rhythm Left axis deviation Minimal voltage criteria for LVH, may be normal variant ( R in aVL ) Abnormal ECG Confirmed by Vonita Moss [phone]) on 12/25/2023 4:04:55 PM  Radiology DG Chest 2 View Result Date: 12/25/2023 CLINICAL DATA:  Shortness of breath. EXAM: CHEST - 2 VIEW COMPARISON:  December 06, 2022 FINDINGS: The heart size and mediastinal contours are within normal limits. Both lungs are clear. Multilevel degenerative changes seen throughout the thoracic spine. IMPRESSION: No active cardiopulmonary disease. Electronically Signed   By: Aram Candela M.D.   On: 12/25/2023 18:19    Procedures Procedures    Medications Ordered in ED Medications - No data to display  ED Course/ Medical Decision Making/ A&P                                 Medical Decision Making Amount and/or Complexity of Data Reviewed Radiology: ordered.  Risk Prescription drug management.   Susan [name] is a 68 y.o. female with comorbidities that complicate the patient evaluation including asthma who presents to the emergency department with cough, congestion, and sore throat.    Initial Ddx:  URI, PNA, sinusitis, asthma, COPD   MDM/Course:   68 year old female with a history of asthma who presents emergency department with cough, congestion, and sore throat.  On exam is overall well-appearing.  I do not hear any significant wheezing at this time.  Says that this happened a year ago and was treated with steroids and antibiotics and that she felt better afterwards.  Suspect that she may have a URI.  Could potentially be a component of sinusitis as well.  With her history of asthma we will go ahead and give her steroids and antibiotics at this  time.  Will have her follow-up with her primary doctor in several days.  This patient presents to the ED for concern of complaints listed in HPI, this involves an extensive number of treatment options, and is a complaint that carries with it a high risk of complications and morbidity. Disposition including potential need for admission considered.   Dispo: DC Home. Return precautions discussed including, but not limited to, those listed in the AVS. Allowed pt time to ask questions which were answered fully prior to dc.  Records reviewed Outpatient Clinic Notes I independently reviewed the following imaging with scope of interpretation limited to determining acute life threatening conditions related to emergency care: Chest x-ray and agree with the radiologist interpretation with the following exceptions: none I have reviewed the patients home medications and made adjustments as needed Social Determinants of health:  Elderly   Portions of this note were generated with Scientist, clinical (histocompatibility and immunogenetics). Dictation errors may occur despite best attempts at proofreading.     Final Clinical Impression(s) / ED Diagnoses Final diagnoses:  Upper respiratory tract infection, unspecified type  Bronchitis    Rx / DC Orders ED Discharge Orders          Ordered    predniSONE (DELTASONE) 20 MG tablet  Daily with breakfast,   Status:  Discontinued        12/25/23 1643    azithromycin (ZITHROMAX) 250 MG tablet   Daily,   Status:  Discontinued        12/25/23 1643    azithromycin (ZITHROMAX) 250 MG tablet  Daily        12/25/23 1704    predniSONE (DELTASONE) 20 MG tablet  Daily with breakfast        12/25/23 1704              Rondel Baton, MD 12/25/23 [phone]

## 2023-12-30 NOTE — Progress Notes (Unsigned)
   Rubin Payor, PhD, LAT, ATC acting as a scribe for Clementeen Graham, MD.  Susan [name] is a 68 y.o. female who presents to [facility] at [facility] today for R foot pain x ***. Pt locates pain to ***  Aggravates: Treatments tried: prednisone  Dx testing: 11/14/23 R foot XR  11/27/22 DEXA scan  Pertinent review of systems: ***  Relevant historical information: ***   Exam:  There were no vitals taken for this visit. General: Well Developed, well nourished, and in no acute distress.   MSK: ***    Lab and Radiology Results No results found for this or any previous visit (from the past 72 hours). No results found.     Assessment and Plan: 68 y.o. female with ***   PDMP not reviewed this encounter. No orders of the defined types were placed in this encounter.  No orders of the defined types were placed in this encounter.    Discussed warning signs or symptoms. Please see discharge instructions. Patient expresses understanding.   ***

## 2023-12-31 ENCOUNTER — Ambulatory Visit (INDEPENDENT_AMBULATORY_CARE_PROVIDER_SITE_OTHER): Admitting: Family Medicine

## 2023-12-31 ENCOUNTER — Other Ambulatory Visit: Payer: Self-pay

## 2023-12-31 ENCOUNTER — Encounter: Payer: Self-pay | Admitting: Family Medicine

## 2023-12-31 VITALS — BP 120/82 | HR 80 | Ht 59.5 in | Wt 201.0 lb

## 2023-12-31 DIAGNOSIS — M7751 Other enthesopathy of right foot: Secondary | ICD-10-CM

## 2023-12-31 DIAGNOSIS — M79671 Pain in right foot: Secondary | ICD-10-CM | POA: Diagnosis not present

## 2023-12-31 NOTE — Patient Instructions (Addendum)
 Thank you for coming in today.   Please work on the home exercises the athletic trainer went over with you:  View at [email or web address] using code: [email or web address]  If not getting better, let me know and I will refer to physical therapy

## 2024-01-10 ENCOUNTER — Encounter: Payer: Self-pay | Admitting: Allergy

## 2024-01-10 ENCOUNTER — Other Ambulatory Visit: Payer: Self-pay

## 2024-01-10 ENCOUNTER — Ambulatory Visit (INDEPENDENT_AMBULATORY_CARE_PROVIDER_SITE_OTHER): Admitting: Allergy

## 2024-01-10 VITALS — BP 122/84 | HR 62 | Temp 99.0°F | Resp 19 | Ht 58.75 in | Wt 201.7 lb

## 2024-01-10 DIAGNOSIS — J4520 Mild intermittent asthma, uncomplicated: Secondary | ICD-10-CM | POA: Diagnosis not present

## 2024-01-10 DIAGNOSIS — T7800XD Anaphylactic reaction due to unspecified food, subsequent encounter: Secondary | ICD-10-CM | POA: Diagnosis not present

## 2024-01-10 DIAGNOSIS — J3089 Other allergic rhinitis: Secondary | ICD-10-CM | POA: Diagnosis not present

## 2024-01-10 DIAGNOSIS — H1013 Acute atopic conjunctivitis, bilateral: Secondary | ICD-10-CM

## 2024-01-10 DIAGNOSIS — J302 Other seasonal allergic rhinitis: Secondary | ICD-10-CM | POA: Diagnosis not present

## 2024-01-10 DIAGNOSIS — T63481D Toxic effect of venom of other arthropod, accidental (unintentional), subsequent encounter: Secondary | ICD-10-CM

## 2024-01-10 DIAGNOSIS — T782XXD Anaphylactic shock, unspecified, subsequent encounter: Secondary | ICD-10-CM | POA: Diagnosis not present

## 2024-01-10 MED ORDER — OLOPATADINE HCL 0.2 % OP SOLN: 1.0000 [drp] | OPHTHALMIC | 5 refills | Status: DC

## 2024-01-10 MED ORDER — MONTELUKAST SODIUM 10 MG PO TABS: 10.0000 mg | ORAL_TABLET | Freq: Every day | ORAL | 5 refills | Status: DC

## 2024-01-10 MED ORDER — LEVOCETIRIZINE DIHYDROCHLORIDE 5 MG PO TABS
5.0000 mg | ORAL_TABLET | Freq: Every day | ORAL | 5 refills | Status: DC
Start: 2024-01-10 — End: 2024-02-20

## 2024-01-10 NOTE — Patient Instructions (Addendum)
 Seasonal Allergic Rhinitis with conjunctivitis Symptoms include sneezing, itchy/watery eyes, sore throat, and ear discomfort, worse in spring and winter.  - Order blood work for allergy testing including environmental panel, shellfish panel, bee sting panel, and tryptase level. - Change Allegra to Xyzal 5mg  daily at this time.  - Start Singulair 10mg  daily at bedtime. This is an antileukotriene that can help with both allergy and asthma symptom control.  If you notice any change in mood/behavior/sleep after starting Singulair then stop this medication and let us know.  Symptoms resolve after stopping the medication.  - Can use Pataday 1 drop each eye daily as needed for itchy/watery eyes  Shellfish Allergy Allergic reactions include hives after consuming lobster. Avoids all shellfish. - Order blood work for shellfish-specific IgE. - Have access to self-injectable epinephrine (Epipen or AuviQ) 0.3mg  at all times - Follow emergency action plan in case of allergic reaction  Bee Sting Allergy Significant localized swelling after bee stings without systemic reactions.  - Order blood work for bee sting-specific IgE and tryptase level. - Have Epipen as above  Asthma Asthma since childhood with occasional flare-ups, particularly when ill. Recent spirometry was generally normal. - Continue current albuterol inhaler as needed. have access to albuterol inhaler 2 puffs every 4-6 hours as needed for cough/wheeze/shortness of breath/chest tightness.  May use 15-20 minutes prior to activity.   Monitor frequency of use.    Asthma control goals:  Full participation in all desired activities (may need albuterol before activity) Albuterol use two time or less a week on average (not counting use with activity) Cough interfering with sleep two time or less a month Oral steroids no more than once a year No hospitalizations  Follow-up in 4-6 months or sooner if needed

## 2024-01-10 NOTE — Progress Notes (Signed)
 New Patient Note  RE: Susan [name] MRN: [id number] DOB: [birth year] Date of Office Visit: 01/10/2024  Primary care provider: Ardith Dark, MD  Chief Complaint: allergies  History of present illness: Susan [name] is a 68 y.o. female presenting today for evaluation of environmental allergies.  Discussed the use of AI scribe software for clinical note transcription with the patient, who gave verbal consent to proceed.  She experiences significant seasonal allergy symptoms, including sneezing, itchy eyes, sore throat, and ear discomfort. Both ears feel clogged and itchy. These symptoms began approximately three to four weeks ago after she opened her windows and was exposed to pollen. She also spits up green mucus. Her allergy symptoms are typically seasonal, worsening in the spring and winter. She experiences sinus infections requiring antibiotics about once a year. She has been using Allegra for about a year and takes it as needed, though she needs it daily during allergy seasons. Prior to [facility], she used Claritin but found it less effective.  She has a known shellfish allergy, which caused hives and sores after consuming lobster about six years ago. She avoids all shellfish but consumes regular fish like salmon. She has not undergone allergy testing for shellfish.  She has a history of bee sting reactions, resulting in significant swelling of the entire arm when stung. She carries an EpiPen but has never used it. No respiratory or systemic symptoms following bee stings.  Her past medical history includes childhood asthma, which occasionally flares up during illness. She uses an albuterol inhaler as needed and recently refilled it. No history of eczema.    Review of systems: 10pt ROS negative unless noted above in HPI  Past medical history: Past Medical History:  Diagnosis Date   Allergy    Anemia    Anxiety state, unspecified    Asthma    Back injury    Eczema     HLD (hyperlipidemia)    intol of statins - follows with lipid clinic   HTN (hypertension)    Irritable bowel syndrome    Lumbosacral spondylosis without myelopathy    improved with PT   Morbid obesity (HCC)    Recurrent upper respiratory infection (URI)     Past surgical history: Past Surgical History:  Procedure Laterality Date   ABSCESS DRAINAGE     abdominal   CESAREAN SECTION  10/23/1975    Family history:  Family History  Problem Relation Age of Onset   Breast cancer Mother 32 - 70   Diabetes Mother    Hypertension Mother    Multiple myeloma Mother    Diabetes Father        on dialysis   Kidney failure Father    Colon cancer Maternal Grandmother    Eczema Daughter    Allergic rhinitis Other    Asthma Grandchild    Allergic rhinitis Grandchild    Asthma Grandchild    Allergic rhinitis Grandchild    Liver disease Neg Hx    Esophageal cancer Neg Hx     Social history: Lives in a home with carpeting with gas heating and central cooling.  No pets in the home.  No concern for water damage, milew or roaches in the home.  She is retired.  Denies a smoking history.   Medication List: Current Outpatient Medications  Medication Sig Dispense Refill   acetaminophen (TYLENOL) 500 MG tablet Take 500 mg by mouth every 4 (four) hours as needed for moderate pain (pain score 4-6).  albuterol (VENTOLIN HFA) 108 (90 Base) MCG/ACT inhaler Inhale 1-2 puffs by mouth into the lungs every 6 (six) hours as needed for wheezing or shortness of breath. 8.5 g 0   CALCIUM PO Take 1 tablet by mouth once daily  Indications: osteopenia     cholecalciferol (VITAMIN D3) 25 MCG (1000 UNIT) tablet Take 5,000 Units by mouth daily.     diclofenac Sodium (VOLTAREN) 1 % GEL Apply 4 g to affected area up to 4 times daily.     EPINEPHrine 0.3 mg/0.3 mL IJ SOAJ injection Inject 0.3 mg into the muscle once as needed (anaphylaxis/allergic reaction). 2 each 0   ezetimibe (ZETIA) 10 MG tablet TAKE ONE  TABLET BY MOUTH ONCE A DAY 90 tablet 0   fluocinonide ointment (LIDEX) 0.05 % Apply 1 Application topically 2 (two) times daily.     orphenadrine (NORFLEX) 100 MG tablet Take 100 mg by mouth as needed for muscle spasms.     POTASSIUM GLUCONATE PO Take 550 mg by mouth daily.     spironolactone (ALDACTONE) 25 MG tablet TAKE ONE TABLET BY MOUTH ONCE A DAY 90 tablet 0   azithromycin (ZITHROMAX) 250 MG tablet Take 1 tablet (250 mg total) by mouth daily. Take first 2 tablets together, then 1 every day until finished. (Patient not taking: Reported on 01/10/2024) 6 tablet 0   No current facility-administered medications for this visit.    Known medication allergies: Allergies  Allergen Reactions   Aspirin     Other reaction(s): Bleeding   Avelox [Moxifloxacin Hcl In Nacl] Anaphylaxis    Pt c/o swelling of her throat and tongue   Shellfish Allergy Anaphylaxis and Hives    Rash and itching Rash and itching   Bee Venom Hives    Other Reaction(s): Angioedema (ALLERGY/intolerance)   Cat Dander Other (See Comments)   Ibuprofen Other (See Comments)    Gastric bleeding Gastric bleeding   Sulfa Antibiotics Hives   Atorvastatin     Muscle aches and memory issues   Codeine Other (See Comments)    REACTION: causes her heart to race   Crestor [Rosuvastatin Calcium]     Pt states INTOLERANT w/ muscle cramps   Penicillins Hives    REACTION: rash   Pravastatin Sodium [Pravachol]     REACTION: intolerant= headache & insomnia   Simvastatin     REACTION: intolerant= headache & insomnia     Physical examination: Blood pressure 122/84, pulse 62, temperature 99 F (37.2 C), resp. rate 19, height 4' 10.75" (1.492 m), weight 201 lb 11.2 oz (91.5 kg), SpO2 100%.  General: Alert, interactive, in no acute distress. HEENT: PERRLA, TMs pearly gray, turbinates mildly edematous without discharge, post-pharynx non erythematous. Neck: Supple without lymphadenopathy. Lungs: Clear to auscultation without  wheezing, rhonchi or rales. {no increased work of breathing. CV: Normal S1, S2 without murmurs. Abdomen: Nondistended, nontender. Skin: Warm and dry, without lesions or rashes. Extremities:  No clubbing, cyanosis or edema. Neuro:   Grossly intact.  Diagnositics/Labs:  Spirometry: FEV1: 1.54L 99%, FVC: 1.71L 87%, ratio consistent with nonobstructive pattern  Assessment and plan: Seasonal Allergic Rhinitis with conjunctivitis Symptoms include sneezing, itchy/watery eyes, sore throat, and ear discomfort, worse in spring and winter.  - Order blood work for allergy testing including environmental panel, shellfish panel, bee sting panel, and tryptase level. - Change Allegra to Xyzal 5mg  daily at this time.  - Start Singulair 10mg  daily at bedtime. This is an antileukotriene that can help with both allergy and asthma symptom  control.  If you notice any change in mood/behavior/sleep after starting Singulair then stop this medication and let us know.  Symptoms resolve after stopping the medication.  - Can use Pataday 1 drop each eye daily as needed for itchy/watery eyes  Food Allergy Allergic reactions include hives after consuming lobster. Avoids all shellfish. - Order blood work for shellfish-specific IgE. - Have access to self-injectable epinephrine (Epipen or AuviQ) 0.3mg  at all times - Follow emergency action plan in case of allergic reaction  Hymenoptera Allergy Significant localized swelling after bee stings without systemic reactions.  - Order blood work for bee sting-specific IgE and tryptase level. - Have Epipen as above  Asthma Asthma since childhood with occasional flare-ups, particularly when ill. Recent spirometry was generally normal. - Continue current albuterol inhaler as needed. have access to albuterol inhaler 2 puffs every 4-6 hours as needed for cough/wheeze/shortness of breath/chest tightness.  May use 15-20 minutes prior to activity.   Monitor frequency of use.     Asthma control goals:  Full participation in all desired activities (may need albuterol before activity) Albuterol use two time or less a week on average (not counting use with activity) Cough interfering with sleep two time or less a month Oral steroids no more than once a year No hospitalizations  Follow-up in 4-6 months or sooner if needed  I appreciate the opportunity to take part in Saide's care. Please do not hesitate to contact me with questions.  Sincerely,   Margo Aye, MD Allergy/Immunology [facility]

## 2024-01-18 ENCOUNTER — Other Ambulatory Visit: Payer: Self-pay | Admitting: Family Medicine

## 2024-01-18 DIAGNOSIS — J4521 Mild intermittent asthma with (acute) exacerbation: Secondary | ICD-10-CM

## 2024-01-18 LAB — HYMENOPTERA VENOM ALLERGY II

## 2024-01-20 LAB — ALLERGENS W/TOTAL IGE AREA 2
Alternaria Alternata IgE: <0.1 kU/L
Aspergillus Fumigatus IgE: <0.1 kU/L
Bermuda Grass IgE: <0.1 kU/L
Cat Dander IgE: <0.1 kU/L
Cedar, Mountain IgE: <0.1 kU/L
Cladosporium Herbarum IgE: <0.1 kU/L
Cockroach, German IgE: <0.1 kU/L
Common Silver Birch IgE: 0.2 kU/L — AB
Cottonwood IgE: <0.1 kU/L
D Farinae IgE: 0.23 kU/L — AB
D Pteronyssinus IgE: 0.25 kU/L — AB
Dog Dander IgE: <0.1 kU/L
Elm, American IgE: <0.1 kU/L
IgE (Immunoglobulin E), Serum: 3 [IU]/mL — ABNORMAL LOW (ref 6–495)
Johnson Grass IgE: <0.1 kU/L
Maple/Box Elder IgE: <0.1 kU/L
Mouse Urine IgE: <0.1 kU/L
Oak, White IgE: 0.17 kU/L — AB
Pecan, Hickory IgE: <0.1 kU/L
Penicillium Chrysogen IgE: <0.1 kU/L
Pigweed, Rough IgE: <0.1 kU/L
Ragweed, Short IgE: <0.1 kU/L
Sheep Sorrel IgE Qn: <0.1 kU/L
Timothy Grass IgE: <0.1 kU/L
White Mulberry IgE: <0.1 kU/L

## 2024-01-20 LAB — HYMENOPTERA VENOM ALLERGY II: Tryptase: 3.8 ug/L (ref 2.2–13.2)

## 2024-01-20 LAB — ALLERGEN PROFILE, SHELLFISH
Clam IgE: <0.1 kU/L
F023-IgE Crab: <0.1 kU/L
F080-IgE Lobster: <0.1 kU/L
F290-IgE Oyster: <0.1 kU/L
Scallop IgE: <0.1 kU/L
Shrimp IgE: <0.1 kU/L

## 2024-01-20 LAB — ALLERGEN COMPONENT COMMENTS

## 2024-01-23 ENCOUNTER — Encounter: Payer: Self-pay | Admitting: Allergy

## 2024-01-28 ENCOUNTER — Other Ambulatory Visit: Payer: Self-pay | Admitting: Family Medicine

## 2024-01-28 NOTE — Telephone Encounter (Signed)
 Pt returned called and requested a call back about labs,

## 2024-02-11 ENCOUNTER — Ambulatory Visit: Payer: Self-pay

## 2024-02-11 NOTE — Telephone Encounter (Signed)
 See below

## 2024-02-11 NOTE — Telephone Encounter (Signed)
 Copied from CRM [id number]. Topic: Clinical - Medication Question >> Feb 11, 2024  9:45 AM Marlan Silva wrote: Reason for CRM: Patient called in wanting to know if she can she still take potassium with the spirolactone medication. She states she read somewhere that Spirolactone has potassium in it, and she does not want to take too much potassium. Patient states she did stop taking the potassium and is having mild pain in her feet. Patient is requesting a call back.   Patient states she has been taking a potassium pill and was recently started on Spirolactone. She wanted to know if it is safe to take both together because she states she read it could effect her potassium levels. I advised that Spirolactone can cause high potassium levels and that I would forward her question to the office who will call her back with an answer when they are able to. Patient states she has stopped taking the potassium due to being unsure if they can be taken together. Please advise patient on if she should be taking potassium or not.    Reason for Disposition  [1] Caller has NON-URGENT medicine question about med that PCP prescribed AND [2] triager unable to answer question  Answer Assessment - Initial Assessment Questions 1. NAME of MEDICINE: "What medicine(s) are you calling about?"     Spirolactone  2. QUESTION: "What is your question?" (e.g., double dose of medicine, side effect)     Can I take potassium with it 3. PRESCRIBER: "Who prescribed the medicine?" Reason: if prescribed by specialist, call should be referred to that group.     Dr. Daneil Dunker  4. SYMPTOMS: "Do you have any symptoms?" If Yes, ask: "What symptoms are you having?"  "How bad are the symptoms (e.g., mild, moderate, severe)     None  Protocols used: Medication Question Call-A-AH

## 2024-02-11 NOTE — Telephone Encounter (Signed)
 This RN made first attempt to triage patient. No answer, LVM. Routing for additional attempts.   Copied from CRM [id number]. Topic: Clinical - Medication Question >> Feb 11, 2024  9:45 AM Marlan Silva wrote: Reason for CRM: Patient called in wanting to know if she can she still take potassium with the spirolactone medication. She states she read somewhere that Spirolactone has potassium in it, and she does not want to take too much potassium. Patient states she did stop taking the potassium and is having mild pain in her feet. Patient is requesting a call back.

## 2024-02-12 NOTE — Telephone Encounter (Signed)
 Patient stated been taking Rx Spironolactone  for a couple of years  Advise to sty off potassium supplements, has a F/U appt on 02/20/2024 Verbalized understanding Will do lab work on visit day

## 2024-02-12 NOTE — Telephone Encounter (Signed)
 Can we clarify? She has been on spironolactone  for at least a couple of years.  Recommend she stay off potassium supplement and come here in 1 to 2 weeks to check labs.

## 2024-02-15 ENCOUNTER — Other Ambulatory Visit: Payer: Self-pay | Admitting: Family Medicine

## 2024-02-15 DIAGNOSIS — J4521 Mild intermittent asthma with (acute) exacerbation: Secondary | ICD-10-CM

## 2024-02-20 ENCOUNTER — Encounter: Payer: Self-pay | Admitting: Family Medicine

## 2024-02-20 ENCOUNTER — Telehealth: Payer: Self-pay

## 2024-02-20 ENCOUNTER — Encounter: Payer: Self-pay | Admitting: Pharmacy Technician

## 2024-02-20 ENCOUNTER — Other Ambulatory Visit (HOSPITAL_COMMUNITY): Payer: Self-pay

## 2024-02-20 ENCOUNTER — Ambulatory Visit (INDEPENDENT_AMBULATORY_CARE_PROVIDER_SITE_OTHER): Payer: Medicare Other | Admitting: Family Medicine

## 2024-02-20 VITALS — BP 145/77 | HR 53 | Temp 97.2°F | Ht <= 58 in | Wt 200.2 lb

## 2024-02-20 DIAGNOSIS — E785 Hyperlipidemia, unspecified: Secondary | ICD-10-CM

## 2024-02-20 DIAGNOSIS — I10 Essential (primary) hypertension: Secondary | ICD-10-CM

## 2024-02-20 DIAGNOSIS — R739 Hyperglycemia, unspecified: Secondary | ICD-10-CM

## 2024-02-20 DIAGNOSIS — E782 Mixed hyperlipidemia: Secondary | ICD-10-CM

## 2024-02-20 LAB — LIPID PANEL
Cholesterol: 246 mg/dL — ABNORMAL HIGH (ref 0–200)
HDL: 93.5 mg/dL (ref >39.00)
LDL Cholesterol: 135 mg/dL — ABNORMAL HIGH (ref 0–99)
NonHDL: 152.1
Total CHOL/HDL Ratio: 3
Triglycerides: 85 mg/dL (ref 0.0–149.0)
VLDL: 17 mg/dL (ref 0.0–40.0)

## 2024-02-20 LAB — COMPREHENSIVE METABOLIC PANEL WITH GFR
ALT: 10 U/L (ref 0–35)
AST: 19 U/L (ref 0–37)
Albumin: 4.2 g/dL (ref 3.5–5.2)
Alkaline Phosphatase: 72 U/L (ref 39–117)
BUN: 13 mg/dL (ref 6–23)
CO2: 27 meq/L (ref 19–32)
Calcium: 9.5 mg/dL (ref 8.4–10.5)
Chloride: 104 meq/L (ref 96–112)
Creatinine, Ser: 0.6 mg/dL (ref 0.40–1.20)
GFR: 91.7 mL/min (ref >60.00)
Glucose, Bld: 81 mg/dL (ref 70–99)
Potassium: 4.1 meq/L (ref 3.5–5.1)
Sodium: 137 meq/L (ref 135–145)
Total Bilirubin: 0.4 mg/dL (ref 0.2–1.2)
Total Protein: 7.2 g/dL (ref 6.0–8.3)

## 2024-02-20 LAB — HEMOGLOBIN A1C: Hgb A1c MFr Bld: 5.9 % (ref 4.6–6.5)

## 2024-02-20 LAB — CBC
HCT: 39 % (ref 36.0–46.0)
Hemoglobin: 12.7 g/dL (ref 12.0–15.0)
MCHC: 32.6 g/dL (ref 30.0–36.0)
MCV: 93.7 fl (ref 78.0–100.0)
Platelets: 341 10*3/uL (ref 150.0–400.0)
RBC: 4.16 Mil/uL (ref 3.87–5.11)
RDW: 14.3 % (ref 11.5–15.5)
WBC: 7.2 10*3/uL (ref 4.0–10.5)

## 2024-02-20 LAB — TSH: TSH: 1.28 u[IU]/mL (ref 0.35–5.50)

## 2024-02-20 MED ORDER — TIRZEPATIDE 2.5 MG/0.5ML ~~LOC~~ SOAJ: 2.5000 mg | SUBCUTANEOUS | 0 refills | Status: DC

## 2024-02-20 NOTE — Assessment & Plan Note (Signed)
 Check lipids today.  She is on Lipitor 10 mg daily and Zetia  10 mg daily per cardiology.

## 2024-02-20 NOTE — Assessment & Plan Note (Signed)
 Above goal today has been slightly elevated at home as well.  We will increase spironolactone  to 50 mg daily.  She will monitor at home and follow-up with us  in a few weeks.  Check labs today.

## 2024-02-20 NOTE — Assessment & Plan Note (Signed)
 Check A1c.  Last A1c was at goal.  She was previously on Ozempic  however had to discontinue due to GI side effects.  She is interested in restarting another medication to potentially help her with controlling her sugars and weight.  We will start Mounjaro 2.5 mg weekly.  We discussed side effects.  She will follow-up with us  in a few weeks we can adjust the dose as tolerated.

## 2024-02-20 NOTE — Telephone Encounter (Signed)
 Hello,                     Our Prior Authorization team has received a request for Rx Mounjaro 2.5MG /0.5ML auto-injectors. However Mounjaro 2.5MG /0.5ML auto-injectors is only FDA Approved when the patient has a diagnosis of Type2 Diabetes and an A1c of 6.5 or greater. Upon searching the patients chart she doesn't qualify for a Prior Authorization at this time and it would be Denied.

## 2024-02-20 NOTE — Telephone Encounter (Signed)
 ERROR

## 2024-02-20 NOTE — Assessment & Plan Note (Signed)
 BMI 41.84 with comorbidities.  She is a good candidate for GLP-1 agonist however did not tolerate Ozempic  previously due to side effects.  We will try Mounjaro.  Hopefully she will be able to tolerate side effects better with this.  Start 2.5 mg weekly.  We did discuss potential side effects.  She will follow-up with us  in a few weeks via MyChart and we can adjust as tolerated.

## 2024-02-20 NOTE — Patient Instructions (Signed)
 It was very nice to see you today!  Please increase your spironolactone  to 50 mg daily.  We will check blood work today.  We will start Mounjaro 2.5 mg weekly.  Send me a message in a few weeks to let me know how you are doing and we can adjust the dose of your medications as needed.  Return in about 6 months (around 08/22/2024) for Annual Physical.   Take care, Dr Daneil Dunker  PLEASE NOTE:  If you had any lab tests, please let us  know if you have not heard back within a few days. You may see your results on mychart before we have a chance to review them but we will give you a call once they are reviewed by us .   If we ordered any referrals today, please let us  know if you have not heard from their office within the next week.   If you had any urgent prescriptions sent in today, please check with the pharmacy within an hour of our visit to make sure the prescription was transmitted appropriately.   Please try these tips to maintain a healthy lifestyle:  Eat at least 3 REAL meals and 1-2 snacks per day.  Aim for no more than 5 hours between eating.  If you eat breakfast, please do so within one hour of getting up.   Each meal should contain half fruits/vegetables, one quarter protein, and one quarter carbs (no bigger than a computer mouse)  Cut down on sweet beverages. This includes juice, soda, and sweet tea.   Drink at least 1 glass of water with each meal and aim for at least 8 glasses per day  Exercise at least 150 minutes every week.

## 2024-02-20 NOTE — Progress Notes (Signed)
   Susan [name] is a 68 y.o. female who presents today for an office visit.  Assessment/Plan:  New/Acute Problems: Foot Pain  Without much improvement.  She will follow-up with sports medicine soon.  Chronic Problems Addressed Today: Essential hypertension Above goal today has been slightly elevated at home as well.  We will increase spironolactone  to 50 mg daily.  She will monitor at home and follow-up with us  in a few weeks.  Check labs today.  Hyperglycemia Check A1c.  Last A1c was at goal.  She was previously on Ozempic  however had to discontinue due to GI side effects.  She is interested in restarting another medication to potentially help her with controlling her sugars and weight.  We will start Mounjaro 2.5 mg weekly.  We discussed side effects.  She will follow-up with us  in a few weeks we can adjust the dose as tolerated.  Dyslipidemia Check lipids today.  She is on Lipitor 10 mg daily and Zetia  10 mg daily per cardiology.  Morbid obesity (HCC) BMI 41.84 with comorbidities.  She is a good candidate for GLP-1 agonist however did not tolerate Ozempic  previously due to side effects.  We will try Mounjaro.  Hopefully she will be able to tolerate side effects better with this.  Start 2.5 mg weekly.  We did discuss potential side effects.  She will follow-up with us  in a few weeks via MyChart and we can adjust as tolerated.     Subjective:  HPI:  See A/P for status of chronic conditions.  Patient is here today for follow-up.  I saw her about 3 months ago.  At that time her blood pressure was elevated however we continued her on the spironolactone  25 mg daily due to previous good control.  We also discussed foot pain that her last visit.  We checked plain film at that time which showed no obvious fracture or other acute abnormalities.  We started her on prednisone  at that time.  Pain did not improve and we referred her to see sports medicine. Pain has not improved much.  Her  blood pressure still been elevated at home as well into the 150s over 70s to 80s.       Objective:  Physical Exam: BP (!) 145/77   Pulse (!) 53   Temp (!) 97.2 F (36.2 C) (Temporal)   Ht 4\' 10"  (1.473 m)   Wt 200 lb 3.2 oz (90.8 kg)   SpO2 99%   BMI 41.84 kg/m   Wt Readings from Last 3 Encounters:  02/20/24 200 lb 3.2 oz (90.8 kg)  01/10/24 201 lb 11.2 oz (91.5 kg)  12/31/23 201 lb (91.2 kg)    Gen: No acute distress, resting comfortably CV: Regular rate and rhythm with no murmurs appreciated Pulm: Normal work of breathing, clear to auscultation bilaterally with no crackles, wheezes, or rhonchi Neuro: Grossly normal, moves all extremities Psych: Normal affect and thought content      Merwyn Hodapp M. Daneil Dunker, MD 02/20/2024 10:08 AM

## 2024-02-21 ENCOUNTER — Other Ambulatory Visit (HOSPITAL_COMMUNITY): Payer: Self-pay

## 2024-02-21 ENCOUNTER — Encounter: Payer: Self-pay | Admitting: Family Medicine

## 2024-02-21 NOTE — Progress Notes (Signed)
 Her cholesterol is up a little bit.  It would probably be a good idea for her to increase her Lipitor to 20 mg daily.  Please send in new prescription for this if she is agreeable.  We should recheck in 3 to 6 months.  Her A1c is up a little bit as well.  Do not need to start meds for this but she should work on diet and exercise and we can recheck in 6 months or so.  The rest of her labs are all at goal.

## 2024-02-23 NOTE — Patient Instructions (Incomplete)
***   was able to tolerate the *** food challenge today at the office without adverse signs or symptoms of an allergic reaction. Therefore, *** has the same risk of systemic reaction associated with the consumption of *** as the general population.  - Do not give any ***  for the next 24 hours. - Monitor for allergic symptoms such as rash, wheezing, diarrhea, swelling, and vomiting for the next 24 hours. If severe symptoms occur, treat with EpiPen injection and call 911. For less severe symptoms treat with Benadryl *** teaspoonfuls every *** hours and call the clinic.  - If no allergic symptoms are evident, reintroduce ***  into the diet. If *** develops an allergic reaction to *** , record what was eaten the amount eaten, preparation method, time from ingestion to reaction, and symptoms.   Call the clinic if this treatment plan is not working well for you  Follow up in *** or sooner if needed.

## 2024-02-23 NOTE — Progress Notes (Unsigned)
   [address] [place] [address] Dept: [phone]  FOLLOW UP NOTE  Patient ID: Susan [name], female    DOB: [birth year]  Age: 68 y.o. MRN: [id number] Date of Office Visit: 02/24/2024  Assessment  Chief Complaint: No chief complaint on file.  HPI [name] is a 68 year old female who presents to the clinic for a follow up visit with possible food challenge. She was last seen in this clinic on 12/21/2023 by Dr. Tempie Fee for evaluation of asthmaa, allergic rhinitis, allergic conjunctivitis, stinging insect allergy and food allergy to shellfish. Her last lab work was on 01/10/2024 and was negative to shellfish.   Discussed the use of AI scribe software for clinical note transcription with the patient, who gave verbal consent to proceed.  History of Present Illness      Drug Allergies:  Allergies  Allergen Reactions   Aspirin     Other reaction(s): Bleeding   Avelox  [Moxifloxacin  Hcl In Nacl] Anaphylaxis    Pt c/o swelling of her throat and tongue   Shellfish Allergy Anaphylaxis and Hives    Rash and itching Rash and itching   Bee Venom Hives    Other Reaction(s): Angioedema (ALLERGY/intolerance)   Cat Dander Other (See Comments)   Ibuprofen Other (See Comments)    Gastric bleeding Gastric bleeding   Sulfa Antibiotics Hives   Atorvastatin      Muscle aches and memory issues   Codeine Other (See Comments)    REACTION: causes her heart to race   Crestor  [Rosuvastatin  Calcium ]     Pt states INTOLERANT w/ muscle cramps   Penicillins Hives    REACTION: rash   Pravastatin Sodium [Pravachol]     REACTION: intolerant= headache & insomnia   Simvastatin     REACTION: intolerant= headache & insomnia    Physical Exam: There were no vitals taken for this visit.   Physical Exam  Diagnostics:   Procedure note:  Written consent obtained  {Blank single:19197::"Open graded *** challenge","Open graded *** oral challenge"}: The patient was able to tolerate the  challenge today without adverse signs or symptoms. Vital signs were stable throughout the challenge and observation period. She received multiple doses separated by {Blank single:19197::"30 minutes","20 minutes","15 minutes","10 minutes"}, each of which was separated by vitals and a brief physical exam. She received the following doses: lip rub, 1 gm, 2 gm, 4 gm, 8 gm, and 16 gm. She was monitored for 60 minutes following the last dose.  Total testing time:  The patient had {Blank single:19197::"***","negative skin prick test and sIgE tests to ***","negative sIgE tests to ***","negative skin prick tests to ***"} and was able to tolerate the open graded oral challenge today without adverse signs or symptoms. Therefore, she has the same risk of systemic reaction associated with {Blank single:19197::"***","the consumption of ***"} as the general population.  Assessment and Plan: No diagnosis found.  No orders of the defined types were placed in this encounter.   There are no Patient Instructions on file for this visit.  No follow-ups on file.    Thank you for the opportunity to care for this patient.  Please do not hesitate to contact me with questions.  Marinus Sic, FNP [facility]

## 2024-02-24 ENCOUNTER — Ambulatory Visit (INDEPENDENT_AMBULATORY_CARE_PROVIDER_SITE_OTHER): Admitting: Family Medicine

## 2024-02-24 ENCOUNTER — Other Ambulatory Visit: Payer: Self-pay

## 2024-02-24 ENCOUNTER — Encounter: Payer: Self-pay | Admitting: Family Medicine

## 2024-02-24 VITALS — BP 138/76 | HR 58 | Temp 98.1°F | Wt 200.6 lb

## 2024-02-24 DIAGNOSIS — T7802XD Anaphylactic reaction due to shellfish (crustaceans), subsequent encounter: Secondary | ICD-10-CM

## 2024-02-24 DIAGNOSIS — T7802XA Anaphylactic reaction due to shellfish (crustaceans), initial encounter: Secondary | ICD-10-CM | POA: Insufficient documentation

## 2024-02-24 NOTE — Addendum Note (Signed)
 Addended by: Norville Beery, Aurianna Earlywine on: 02/24/2024 06:01 PM   Modules accepted: Orders

## 2024-02-24 NOTE — Telephone Encounter (Signed)
 Spoke with patient  Notified  Rx Mounjaro Not approve, it only be approve when the patient has a diagnosis of Type2 Diabetes and an A1c of 6.5 or greater.  Verbalized understanding

## 2024-02-24 NOTE — Telephone Encounter (Signed)
 Left message to return call to our office at their convenience.

## 2024-02-25 NOTE — Telephone Encounter (Signed)
 Would not recommend making any further changes to medications at this point.  Recommend she discuss with cardiology to see if there are any other alternatives she may benefit from.

## 2024-02-25 NOTE — Addendum Note (Signed)
 Addended by: Norville Beery, Jaydin Boniface on: 02/25/2024 04:39 PM   Modules accepted: Orders

## 2024-02-26 ENCOUNTER — Other Ambulatory Visit: Payer: Self-pay

## 2024-02-26 ENCOUNTER — Ambulatory Visit: Admitting: Family Medicine

## 2024-02-26 VITALS — BP 174/80 | HR 56 | Ht <= 58 in | Wt 200.0 lb

## 2024-02-26 DIAGNOSIS — G8929 Other chronic pain: Secondary | ICD-10-CM | POA: Diagnosis not present

## 2024-02-26 DIAGNOSIS — M25571 Pain in right ankle and joints of right foot: Secondary | ICD-10-CM

## 2024-02-26 DIAGNOSIS — M25572 Pain in left ankle and joints of left foot: Secondary | ICD-10-CM

## 2024-02-26 NOTE — Progress Notes (Signed)
 [name], PhD, LAT, ATC acting as a scribe for Garlan Juniper, MD.  Susan [name] is a 68 y.o. female who presents to [facility] today for bilat foot pain. Pt was previously seen by Dr. Alease Hunter on 12/31/23 for R lateral foot pain and was taught HEP.  Today, pt c/o bilat foot pain x 3 wks. On her R-side, pt locates pain to lateral aspect of the R foot and R lateral ankle. Pain on the L side is all over the foot and ankle.   Aggravates: walking Treatments tried: wearing high top sneakers  Dx testing: 11/14/23 R foot XR             11/27/22 DEXA scan  Pertinent review of systems: No fevers or chills  Relevant historical information: Hypertension and osteoporosis.   Exam:  BP (!) 174/80   Pulse (!) 56   Ht 4\' 10"  (1.473 m)   Wt 200 lb (90.7 kg)   SpO2 98%   BMI 41.80 kg/m  General: Well Developed, well nourished, and in no acute distress.   MSK: Ingels bilaterally mild effusion normal motion with crepitation.  Tender palpation lateral joint line and medial joint line both ankles.    Lab and Radiology Results  Procedure: Real-time Ultrasound Guided Injection of right ankle joint lateral approach Device: Philips Affiniti 50G/GE Logiq Images permanently stored and available for review in PACS Verbal informed consent obtained.  Discussed risks and benefits of procedure. Warned about infection, bleeding, hyperglycemia damage to structures among others. Patient expresses understanding and agreement Time-out conducted.   Noted no overlying erythema, induration, or other signs of local infection.   Skin prepped in a sterile fashion.   Local anesthesia: Topical Ethyl chloride.   With sterile technique and under real time ultrasound guidance: 40 mg of Kenalog and 1 mL of Marcaine injected into ankle joint. Fluid seen entering the joint capsule.   Completed without difficulty   Pain immediately resolved suggesting accurate placement of the  medication.   Advised to call if fevers/chills, erythema, induration, drainage, or persistent bleeding.   Images permanently stored and available for review in the ultrasound unit.  Impression: Technically successful ultrasound guided injection.    Procedure: Real-time Ultrasound Guided Injection of left ankle joint lateral approach Device: Philips Affiniti 50G/GE Logiq Images permanently stored and available for review in PACS Verbal informed consent obtained.  Discussed risks and benefits of procedure. Warned about infection, bleeding, hyperglycemia damage to structures among others. Patient expresses understanding and agreement Time-out conducted.   Noted no overlying erythema, induration, or other signs of local infection.   Skin prepped in a sterile fashion.   Local anesthesia: Topical Ethyl chloride.   With sterile technique and under real time ultrasound guidance: 40 mg of Kenalog and 1 mL of Marcaine injected into ankle joint. Fluid seen entering the joint capsule.   Completed without difficulty   Pain immediately resolved suggesting accurate placement of the medication.   Advised to call if fevers/chills, erythema, induration, drainage, or persistent bleeding.   Images permanently stored and available for review in the ultrasound unit.  Impression: Technically successful ultrasound guided injection.        Assessment and Plan: 68 y.o. female with ankle pain.  There is some degeneration seen on prior x-ray.  She also has some evidence of peroneal tendinitis.  Plan for intra-articular injection of both ankles today.  If not improving consider targeted peroneal tendon injection.  This require CAM  Walker boot for few weeks which is not ideal.  She will keep me updated with how she is feeling.   PDMP not reviewed this encounter. Orders Placed This Encounter  Procedures   US  LIMITED JOINT SPACE STRUCTURES LOW BILAT(NO LINKED CHARGES)    Reason for Exam (SYMPTOM  OR DIAGNOSIS  REQUIRED):   bilateral foot pain    Preferred imaging location?:    [facility]   No orders of the defined types were placed in this encounter.    Discussed warning signs or symptoms. Please see discharge instructions. Patient expresses understanding.   The above documentation has been reviewed and is accurate and complete Garlan Juniper, M.D.

## 2024-02-26 NOTE — Patient Instructions (Addendum)
 Thank you for coming in today.   You received an injection today. Seek immediate medical attention if the joint becomes red, extremely painful, or is oozing fluid.   Check back as needed  If not better, we can inject a different spot, but you will have to wear boots after that.

## 2024-03-02 ENCOUNTER — Telehealth: Payer: Self-pay | Admitting: *Deleted

## 2024-03-02 ENCOUNTER — Other Ambulatory Visit: Payer: Self-pay | Admitting: *Deleted

## 2024-03-02 MED ORDER — SPIRONOLACTONE 50 MG PO TABS: 50.0000 mg | ORAL_TABLET | Freq: Every day | ORAL | 0 refills | Status: DC

## 2024-03-02 NOTE — Telephone Encounter (Signed)
 Rx send to pharmacy  Patient notified, F/U with us  in a few weeks

## 2024-03-02 NOTE — Telephone Encounter (Signed)
 Copied from CRM [id number]. Topic: Clinical - Medication Question >> Feb 28, 2024  1:47 PM Trula Gable C wrote: Reason for CRM: Patient called in regarding spironolactone  (ALDACTONE ) 25 MG tablet , stated she was supposed to start taking  50 mg but was never called in, stated she took two of the 25mg  last night , patient wanted to know if she needs to continue doing that or will the 50 mg be called in . Would like a callback regarding this   Please advise Treavor Blomquist,RMA

## 2024-03-02 NOTE — Telephone Encounter (Signed)
 Please send in new prescription for 50 mg tablet and have her follow up with us  in a few weeks.  Jinny Mounts. Daneil Dunker, MD 03/02/2024 12:20 PM

## 2024-03-11 ENCOUNTER — Telehealth: Payer: Self-pay | Admitting: Internal Medicine

## 2024-03-11 DIAGNOSIS — I10 Essential (primary) hypertension: Secondary | ICD-10-CM

## 2024-03-11 DIAGNOSIS — Z79899 Other long term (current) drug therapy: Secondary | ICD-10-CM

## 2024-03-11 DIAGNOSIS — R55 Syncope and collapse: Secondary | ICD-10-CM

## 2024-03-11 NOTE — Telephone Encounter (Signed)
 Go back to 25 mg  Follow headache    Write back with response and with how pressures are running

## 2024-03-11 NOTE — Telephone Encounter (Signed)
 Pt c/o medication issue:  1. Name of Medication: spironolactone  (ALDACTONE ) 50 MG tablet   2. How are you currently taking this medication (dosage and times per day)? As written   3. Are you having a reaction (difficulty breathing--STAT)? No  4. What is your medication issue? Pt states this medication is causing her to have headaches and use the restroom more than normal.

## 2024-03-11 NOTE — Telephone Encounter (Signed)
 Spoke with patient and she states she takes her spironolactone  at night.  Since the increase she wakes up with a headache. She also states its not really lowering her BP. Today BP was 135/81, 5/20 138/84 and 5/14- 134/71. She would like to know if she can go back to 25 mg.

## 2024-03-17 ENCOUNTER — Other Ambulatory Visit: Payer: Self-pay | Admitting: Family Medicine

## 2024-03-17 DIAGNOSIS — J4521 Mild intermittent asthma with (acute) exacerbation: Secondary | ICD-10-CM

## 2024-03-18 NOTE — Telephone Encounter (Signed)
 Left detailed message for patient (OK per DPR).  Per Dr. Avanell Bob: Go back to 25 mg  Follow headache    Write back with response and with how pressures are running    Provided office number for callback if any questions.

## 2024-04-01 NOTE — Telephone Encounter (Signed)
 Confirm what she is taking for BP I would add amlodipine  5 mg daily

## 2024-04-01 NOTE — Telephone Encounter (Signed)
 Patient called to report her BP readings as requested.  05/28   7:40 am  157/84 1:21 pm    160/94  11:19 pm   146/88 11:21 pm    147/87  6/3 2:55 pm   161/80 2:59 pm   151/81  6/5 11:43 am   139/87 11:51 am    137/87  6/6 10:10 am   154/85 10:46 am    179/93 10:48 pm   170/90  6/7 9:46 am   145/90  6/8 8:44 pm  157/79  6/9 9:08 am   156/93  6/10 7:55 pm   151/80  Patient noted she has not taken her BP reading today and has started back taking her medication at night.

## 2024-04-07 NOTE — Telephone Encounter (Signed)
 Left a message for the pt to call back.

## 2024-04-08 MED ORDER — AMLODIPINE BESYLATE 5 MG PO TABS: 5.0000 mg | ORAL_TABLET | Freq: Every day | ORAL | 3 refills | Status: DC

## 2024-04-08 NOTE — Telephone Encounter (Signed)
Left message for patient to call back. Also sent MyChart message.

## 2024-04-08 NOTE — Telephone Encounter (Signed)
 Patient was returning call. Please advise ?

## 2024-04-14 ENCOUNTER — Other Ambulatory Visit: Payer: Self-pay | Admitting: Family Medicine

## 2024-04-14 DIAGNOSIS — J4521 Mild intermittent asthma with (acute) exacerbation: Secondary | ICD-10-CM

## 2024-04-28 NOTE — Telephone Encounter (Signed)
 BP is still high    I would keep on spironolactone    Confirm dose    25 or 50 mg If taking 5 mg amlodipine  I would increase to 10 mg daily

## 2024-05-01 MED ORDER — SPIRONOLACTONE 25 MG PO TABS: 25.0000 mg | ORAL_TABLET | Freq: Every day | ORAL | 3 refills | Status: DC

## 2024-05-01 MED ORDER — AMLODIPINE BESYLATE 10 MG PO TABS
10.0000 mg | ORAL_TABLET | Freq: Every day | ORAL | Status: DC
Start: 2024-05-01 — End: 2024-06-08

## 2024-05-01 NOTE — Telephone Encounter (Signed)
 Left message for patient to callback. Also sent MyChart message with Dr. Okey' recommendations. Medication list updated and refill for spironolactone  sent to [facility] pharmacy.

## 2024-05-21 MED ORDER — TRIAMTERENE-HCTZ 37.5-25 MG PO TABS: 0.5000 | ORAL_TABLET | Freq: Every day | ORAL | 3 refills | Status: DC

## 2024-05-21 NOTE — Telephone Encounter (Signed)
 I would recomm adding maxzide 37.5/25 (1/2 tab) to regimen FOllow BP    BMET in 2 wks

## 2024-06-08 MED ORDER — AMLODIPINE BESYLATE 10 MG PO TABS: 10.0000 mg | ORAL_TABLET | Freq: Every day | ORAL | 1 refills | Status: DC

## 2024-06-10 NOTE — Telephone Encounter (Signed)
 OK    Keep watching BP   Goal in 110s to low 130s

## 2024-07-06 NOTE — Telephone Encounter (Signed)
 Susan [name] BP readings look very good  CHeck a CBC, BMET  Is she getting enough fluid furing the day?   Is there a particular time that she has these spells?

## 2024-07-06 NOTE — Addendum Note (Signed)
 Addended by: VICCI ROXIE CROME on: 07/06/2024 08:14 AM   Modules accepted: Orders

## 2024-07-07 LAB — BASIC METABOLIC PANEL WITH GFR
BUN/Creatinine Ratio: 16 (ref 12–28)
BUN: 12 mg/dL (ref 8–27)
CO2: 22 mmol/L (ref 20–29)
Calcium: 10.1 mg/dL (ref 8.7–10.3)
Chloride: 99 mmol/L (ref 96–106)
Creatinine, Ser: 0.76 mg/dL (ref 0.57–1.00)
Glucose: 87 mg/dL (ref 70–99)
Potassium: 4.7 mmol/L (ref 3.5–5.2)
Sodium: 139 mmol/L (ref 134–144)
eGFR: 85 mL/min/1.73 (ref >59)

## 2024-07-07 LAB — CBC
Hematocrit: 39.8 % (ref 34.0–46.6)
Hemoglobin: 13 g/dL (ref 11.1–15.9)
MCH: 30.6 pg (ref 26.6–33.0)
MCHC: 32.7 g/dL (ref 31.5–35.7)
MCV: 94 fL (ref 79–97)
Platelets: 419 x10E3/uL (ref 150–450)
RBC: 4.25 x10E6/uL (ref 3.77–5.28)
RDW: 12.8 % (ref 11.7–15.4)
WBC: 7.7 x10E3/uL (ref 3.4–10.8)

## 2024-07-08 ENCOUNTER — Ambulatory Visit: Payer: Self-pay | Admitting: Internal Medicine

## 2024-07-13 ENCOUNTER — Encounter (HOSPITAL_BASED_OUTPATIENT_CLINIC_OR_DEPARTMENT_OTHER): Payer: Self-pay | Admitting: Emergency Medicine

## 2024-07-13 ENCOUNTER — Other Ambulatory Visit: Payer: Self-pay

## 2024-07-13 ENCOUNTER — Emergency Department (HOSPITAL_BASED_OUTPATIENT_CLINIC_OR_DEPARTMENT_OTHER)
Admission: EM | Admit: 2024-07-13 | Discharge: 2024-07-13 | Disposition: A | Discharge status: Home / Self Care | Attending: Emergency Medicine | Admitting: Emergency Medicine

## 2024-07-13 ENCOUNTER — Emergency Department (HOSPITAL_BASED_OUTPATIENT_CLINIC_OR_DEPARTMENT_OTHER): Admitting: Radiology

## 2024-07-13 DIAGNOSIS — M25572 Pain in left ankle and joints of left foot: Secondary | ICD-10-CM | POA: Diagnosis not present

## 2024-07-13 DIAGNOSIS — S92355A Nondisplaced fracture of fifth metatarsal bone, left foot, initial encounter for closed fracture: Secondary | ICD-10-CM | POA: Diagnosis not present

## 2024-07-13 DIAGNOSIS — I10 Essential (primary) hypertension: Secondary | ICD-10-CM | POA: Diagnosis not present

## 2024-07-13 DIAGNOSIS — M25561 Pain in right knee: Secondary | ICD-10-CM | POA: Diagnosis not present

## 2024-07-13 DIAGNOSIS — W010XXA Fall on same level from slipping, tripping and stumbling without subsequent striking against object, initial encounter: Secondary | ICD-10-CM | POA: Diagnosis not present

## 2024-07-13 DIAGNOSIS — M1712 Unilateral primary osteoarthritis, left knee: Secondary | ICD-10-CM | POA: Diagnosis not present

## 2024-07-13 DIAGNOSIS — Z043 Encounter for examination and observation following other accident: Secondary | ICD-10-CM | POA: Diagnosis not present

## 2024-07-13 DIAGNOSIS — M1711 Unilateral primary osteoarthritis, right knee: Secondary | ICD-10-CM | POA: Diagnosis not present

## 2024-07-13 DIAGNOSIS — S92352A Displaced fracture of fifth metatarsal bone, left foot, initial encounter for closed fracture: Secondary | ICD-10-CM | POA: Diagnosis not present

## 2024-07-13 DIAGNOSIS — Z79899 Other long term (current) drug therapy: Secondary | ICD-10-CM | POA: Diagnosis not present

## 2024-07-13 DIAGNOSIS — W19XXXA Unspecified fall, initial encounter: Secondary | ICD-10-CM

## 2024-07-13 MED ORDER — ACETAMINOPHEN 500 MG PO TABS
1000.0000 mg | ORAL_TABLET | Freq: Once | ORAL | Status: DC
  Filled 2024-07-13: qty 2

## 2024-07-13 NOTE — Discharge Instructions (Addendum)
 You did break the outside of your right foot.  Will place you in a boot as well as give crutches to help aid in ambulation.  Take Tylenol , ibuprofen for any pain.  Recommend follow-up with orthopedics in the outpatient setting for reassessment.

## 2024-07-13 NOTE — Progress Notes (Unsigned)
 Susan [name] [name] [address] [place] [address] Phone: [phone] Subjective:   Susan [name], am serving as a scribe for Dr. Arthea [name].  I'm seeing this patient by the request  of:  Kennyth Worth HERO, MD  CC: Left ankle pain  YEP:Dlagzrupcz  Susan [name] is a 68 y.o. female coming in with complaint of L ankle pain. Has seen Dr. Joane. Clemens and went to [facility]. Xrays are in. Pain is lateral. Has been icing and soaking foot.  Patient did have x-ray noted.  Past Medical History:  Diagnosis Date   Allergy     Anemia    Anxiety state, unspecified    Asthma    Back injury    Eczema    HLD (hyperlipidemia)    intol of statins - follows with lipid clinic   HTN (hypertension)    Irritable bowel syndrome    Lumbosacral spondylosis without myelopathy    improved with PT   Morbid obesity (HCC)    Recurrent upper respiratory infection (URI)    Past Surgical History:  Procedure Laterality Date   ABSCESS DRAINAGE     abdominal   CESAREAN SECTION  10/23/1975   Social History   Socioeconomic History   Marital status: Divorced    Spouse name: Not on file   Number of children: 1   Years of education: college   Highest education level: Not on file  Occupational History   Occupation: Retired    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[employer]    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   Tobacco Use   Smoking status: Never   Smokeless tobacco: Never  Vaping Use   Vaping status: Never Used  Substance and Sexual Activity   Alcohol use: No   Drug use: No   Sexual activity: Not Currently  Other Topics Concern   Not on file  Social History Narrative   Lives alone, divorced since 57   Adult dtr in [place]   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   Social Drivers of Health   Financial Resource Strain: Low Risk  (06/29/2024)   Received from [facility]   Overall Financial Resource Strain (CARDIA)    How hard is it for you to pay for the very basics like food, housing,  medical care, and heating?: Not very hard  Food Insecurity: No Food Insecurity (06/29/2024)   Received from [facility]   Hunger Vital Sign    Within the past 12 months, you worried that your food would run out before you got the money to buy more.: Never true    Within the past 12 months, the food you bought just didn't last and you didn't have money to get more.: Never true  Transportation Needs: No Transportation Needs (06/29/2024)   Received from [facility] - Transportation    In the past 12 months, has lack of transportation kept you from medical appointments or from getting medications?: No    In the past 12 months, has lack of transportation kept you from meetings, work, or from getting things needed for daily living?: No  Physical Activity: Sufficiently Active (06/29/2024)   Received from [facility]   Exercise Vital Sign    On average, how many days per week do you engage in moderate to strenuous exercise (like a brisk walk)?: 5 days    On average, how many minutes do you engage in exercise at this level?: 60 min  Stress: No Stress  Concern Present (06/29/2024)   Received from [facility] - Occupational Stress Questionnaire    Do you feel stress - tense, restless, nervous, or anxious, or unable to sleep at night because your mind is troubled all the time - these days?: Not at all  Social Connections: Socially Integrated (06/29/2024)   Received from [facility]   Social Network    How would you rate your social network (family, work, friends)?: Good participation with social networks   Allergies  Allergen Reactions   Aspirin     Other reaction(s): Bleeding   Avelox  [Moxifloxacin  Hcl In Nacl] Anaphylaxis    Pt c/o swelling of her throat and tongue   Bee Venom Hives    Other Reaction(s): Angioedema (ALLERGY /intolerance)   Cat Dander Other (See Comments)   Ibuprofen Other (See Comments)    Gastric bleeding Gastric bleeding    Sulfa Antibiotics Hives   Atorvastatin      Muscle aches and memory issues   Codeine Other (See Comments)    REACTION: causes her heart to race   Crestor  [Rosuvastatin  Calcium ]     Pt states INTOLERANT w/ muscle cramps   Penicillins Hives    REACTION: rash   Pravastatin Sodium [Pravachol]     REACTION: intolerant= headache & insomnia   Simvastatin     REACTION: intolerant= headache & insomnia   Family History  Problem Relation Age of Onset   Breast cancer Mother 67 - 35   Diabetes Mother    Hypertension Mother    Multiple myeloma Mother    Diabetes Father        on dialysis   Kidney failure Father    Colon cancer Maternal Grandmother    Eczema Daughter    Allergic rhinitis Other    Asthma Grandchild    Allergic rhinitis Grandchild    Asthma Grandchild    Allergic rhinitis Grandchild    Liver disease Neg Hx    Esophageal cancer Neg Hx     Current Outpatient Medications (Endocrine & Metabolic):    tirzepatide  (MOUNJARO ) 2.5 MG/0.5ML Pen, Inject 2.5 mg into the skin once a week.  Current Outpatient Medications (Cardiovascular):    amLODipine  (NORVASC ) 10 MG tablet, Take 1 tablet (10 mg total) by mouth daily.   EPINEPHrine  0.3 mg/0.3 mL IJ SOAJ injection, Inject 0.3 mg into the muscle once as needed (anaphylaxis/allergic reaction).   ezetimibe  (ZETIA ) 10 MG tablet, TAKE ONE TABLET BY MOUTH ONCE A DAY   spironolactone  (ALDACTONE ) 25 MG tablet, Take 1 tablet (25 mg total) by mouth daily.   triamterene -hydrochlorothiazide  (MAXZIDE-25) 37.5-25 MG tablet, Take 0.5 tablets by mouth daily.  Current Outpatient Medications (Respiratory):    albuterol  (VENTOLIN  HFA) 108 (90 Base) MCG/ACT inhaler, INHALE 1 TO 2 PUFFS BY MOUTH INTO THE LUNGS EVERY 6 HOURS AS NEEDED FOR WHEEZING OR SHORTNESS OF BREATH    Current Outpatient Medications (Other):    Vitamin D , Ergocalciferol , (DRISDOL ) 1.25 MG (50000 UNIT) CAPS capsule, Take 1 capsule (50,000 Units total) by mouth every 7 (seven)  days.   CALCIUM  PO, Take 1 tablet by mouth once daily  Indications: osteopenia   cholecalciferol (VITAMIN D3) 25 MCG (1000 UNIT) tablet, Take 5,000 Units by mouth daily.   diclofenac Sodium (VOLTAREN) 1 % GEL, Apply 4 g to affected area up to 4 times daily.   fluocinonide  ointment (LIDEX ) 0.05 %, Apply 1 Application topically 2 (two) times daily.   orphenadrine (NORFLEX) 100 MG tablet, Take 100 mg  by mouth as needed for muscle spasms.   Reviewed prior external information including notes and imaging from  primary care provider As well as notes that were available from care everywhere and other healthcare systems.  Past medical history, social, surgical and family history all reviewed in electronic medical record.  No pertanent information unless stated regarding to the chief complaint.   Review of Systems:  No headache, visual changes, nausea, vomiting, diarrhea, constipation, dizziness, abdominal pain, skin rash, fevers, chills, night sweats, weight loss, swollen lymph nodes, body aches, joint swelling, chest pain, shortness of breath, mood changes. POSITIVE muscle aches  Objective  Blood pressure 126/76, height 4' 10 (1.473 m), weight 205 lb (93 kg).   General: No apparent distress alert and oriented x3 mood and affect normal, dressed appropriately.  HEENT: Pupils equal, extraocular movements intact  Respiratory: Patient's speak in full sentences and does not appear short of breath  Antalgic gait Patient does have swelling noted of the left foot.  Tender to palpation over the fifth metatarsal.  Some pain over the peroneal tendon as well.    Impression and Recommendations:    The above documentation has been reviewed and is accurate and complete Tavaughn Silguero M Neli Fofana, DO

## 2024-07-13 NOTE — ED Triage Notes (Signed)
 Arrives ambulatory to the ED with complaints of having left foot pain related to a having a mechanical fall earlier today. Patient is now havign left foot pain, right knee pain, and right elbow pain.

## 2024-07-13 NOTE — ED Provider Notes (Signed)
  EMERGENCY DEPARTMENT AT [facility] Provider Note   CSN: [id number] Arrival date & time: 07/13/24  1450     Patient presents with: Fall and Foot Pain (left)   Susan [name] is a 68 y.o. female.    Fall  Foot Pain   68 year old female presents emergency department with complaints of fall.  States that she was walking to pay for water pill when she tripped and fell earlier.  States that she rolled her left ankle inward starting pain to the outside of her left foot/ankle.  Landed on both of her knees and is having pain in both of her knees as well as her right elbow.  Denies trauma to head, LOC, blood thinner use.  Has been walking since then but with some discomfort.  Has taken no medication for symptoms.  Denies any chest pain, neck pain, back pain.    Past medical history significant for hypertension, IBS, hyperlipidemia, anemia, asthma, eczema  Prior to Admission medications   Medication Sig Start Date End Date Taking? Authorizing Provider  albuterol  (VENTOLIN  HFA) 108 (90 Base) MCG/ACT inhaler INHALE 1 TO 2 PUFFS BY MOUTH INTO THE LUNGS EVERY 6 HOURS AS NEEDED FOR WHEEZING OR SHORTNESS OF BREATH 04/14/24   Kennyth Worth HERO, MD  amLODipine  (NORVASC ) 10 MG tablet Take 1 tablet (10 mg total) by mouth daily. 06/08/24   Okey Vina LULLA, MD  CALCIUM  PO Take 1 tablet by mouth once daily  Indications: osteopenia    [provider]  cholecalciferol (VITAMIN D3) 25 MCG (1000 UNIT) tablet Take 5,000 Units by mouth daily.    [provider]  diclofenac Sodium (VOLTAREN) 1 % GEL Apply 4 g to affected area up to 4 times daily. 08/24/19   [provider]  EPINEPHrine  0.3 mg/0.3 mL IJ SOAJ injection Inject 0.3 mg into the muscle once as needed (anaphylaxis/allergic reaction). 08/23/23   Kennyth Worth HERO, MD  ezetimibe  (ZETIA ) 10 MG tablet TAKE ONE TABLET BY MOUTH ONCE A DAY 01/28/24   Kennyth Worth HERO, MD  fluocinonide  ointment (LIDEX ) 0.05 % Apply 1  Application topically 2 (two) times daily.    [provider]  orphenadrine (NORFLEX) 100 MG tablet Take 100 mg by mouth as needed for muscle spasms. 01/21/19   [provider]  spironolactone  (ALDACTONE ) 25 MG tablet Take 1 tablet (25 mg total) by mouth daily. 05/01/24   Okey Vina LULLA, MD  tirzepatide  (MOUNJARO ) 2.5 MG/0.5ML Pen Inject 2.5 mg into the skin once a week. 02/20/24   Kennyth Worth HERO, MD  triamterene -hydrochlorothiazide  (MAXZIDE-25) 37.5-25 MG tablet Take 0.5 tablets by mouth daily. 05/21/24   Okey Vina LULLA, MD    Allergies: Aspirin, Avelox  [moxifloxacin  hcl in nacl], Bee venom, Cat dander, Ibuprofen, Sulfa antibiotics, Atorvastatin , Codeine, Crestor  [rosuvastatin  calcium ], Penicillins, Pravastatin sodium [pravachol], and Simvastatin    Review of Systems  All other systems reviewed and are negative.   Updated Vital Signs BP 120/72 (BP Location: Left Arm)   Pulse 70   Temp 98.4 F (36.9 C) (Oral)   Resp 20   Ht 4' 10 (1.473 m)   Wt 90.7 kg   SpO2 100%   BMI 41.79 kg/m   Physical Exam Vitals and nursing note reviewed.  Constitutional:      General: She is not in acute distress.    Appearance: She is well-developed.  HENT:     Head: Normocephalic and atraumatic.  Eyes:     Conjunctiva/sclera: Conjunctivae normal.  Cardiovascular:  Rate and Rhythm: Normal rate and regular rhythm.  Pulmonary:     Effort: Pulmonary effort is normal. No respiratory distress.     Breath sounds: Normal breath sounds.  Abdominal:     Palpations: Abdomen is soft.     Tenderness: There is no abdominal tenderness. There is no guarding.  Musculoskeletal:        General: No swelling.     Cervical back: Neck supple.     Comments: No midline tenderness cervical and thoracic, lumbar spine without step-off or deformity.  No chest wall tenderness.  Patient with mild swelling, tenderness over olecranon on the right side.  Also tenderness medial/lateral joint line of left knee with  anterior patellar tenderness right knee.  Lateral malleoli tenderness on the left ankle soft tissue swelling present as well as tenderness base of fifth metatarsal on the left foot.  Otherwise, no reproducible tenderness bilateral upper or lower extremities.  Skin:    General: Skin is warm and dry.     Capillary Refill: Capillary refill takes less than 2 seconds.  Neurological:     Mental Status: She is alert.  Psychiatric:        Mood and Affect: Mood normal.     (all labs ordered are listed, but only abnormal results are displayed) Labs Reviewed - No data to display  EKG: None  Radiology: No results found.   Procedures   Medications Ordered in the ED - No data to display                                  Medical Decision Making Amount and/or Complexity of Data Reviewed Radiology: ordered.  Risk OTC drugs.   This patient presents to the ED for concern of fall, this involves an extensive number of treatment options, and is a complaint that carries with it a high risk of complications and morbidity.  The differential diagnosis includes cva, fracture, strain/sprain, dislocation, ligament/tendon injury, neurovasc compromise, intra-abdominal organ injury, other   Co morbidities that complicate the patient evaluation  See HPI   Additional history obtained:  Additional history obtained from EMR External records from outside source obtained and reviewed including hospital records   Lab Tests:  N/a   Imaging Studies ordered:  I ordered imaging studies including left foot/ankle x-ray, left knee x-ray, right knee x-ray, right elbow x-ray I independently visualized and interpreted imaging which showed  left foot/ankle x-ray: Base of fifth metatarsal fracture. Left knee x-ray: No acute abnormality Right knee x-ray: No acute osseous abnormality Right elbow x-ray: No acute osseous abnormality I agree with the radiologist interpretation   Cardiac Monitoring: /  EKG:  N/a   Consultations Obtained:  N/a   Problem List / ED Course / Critical interventions / Medication management  Fall I ordered medication including Tylenol    Reevaluation of the patient after these medicines showed that the patient improved I have reviewed the patients home medicines and have made adjustments as needed   Social Determinants of Health:  Denies tobacco, licit drug use.   Test / Admission - Considered:  Fall Vitals signs within normal range and stable throughout visit. Imaging studies significant for: See above 68 year old female presents emergency department with complaints of fall.  States that she was walking to pay for water pill when she tripped and fell earlier.  States that she rolled her left ankle inward starting pain to the outside of her left  foot/ankle.  Landed on both of her knees and is having pain in both of her knees as well as her right elbow.  Denies trauma to head, LOC, blood thinner use.  Has been walking since then but with some discomfort.  Has taken no medication for symptoms.  Denies any chest pain, neck pain, back pain. Exam, patient with reproducible tenderness left foot/ankle as above, bilateral knee as well as right elbow.  X-rays performed showed evidence of base of fifth metatarsal fracture of left foot.  Unfortunately, patient decided to leave prior to x-rays resulting.  Was not able to communicate with patient regarding her x-ray reads before she left.  Attempted to call patient without success.       Final diagnoses:  None    ED Discharge Orders     None          Silver Wonda LABOR, [place] 07/13/24 1800    Geraldene Hamilton, MD 07/14/24 2322

## 2024-07-13 NOTE — ED Notes (Addendum)
Pt refusing tylenol at this time

## 2024-07-13 NOTE — ED Notes (Signed)
 Pt upset with the wait. Wanted to leave before her scans came back. Pt did not sign AMA forms, wheeled pt out to her car.

## 2024-07-14 ENCOUNTER — Ambulatory Visit (INDEPENDENT_AMBULATORY_CARE_PROVIDER_SITE_OTHER): Admitting: Family Medicine

## 2024-07-14 VITALS — BP 126/76 | Ht <= 58 in | Wt 205.0 lb

## 2024-07-14 DIAGNOSIS — S92355A Nondisplaced fracture of fifth metatarsal bone, left foot, initial encounter for closed fracture: Secondary | ICD-10-CM | POA: Diagnosis not present

## 2024-07-14 MED ORDER — VITAMIN D (ERGOCALCIFEROL) 1.25 MG (50000 UNIT) PO CAPS: 50000.0000 [IU] | ORAL_CAPSULE | ORAL | 0 refills | Status: DC

## 2024-07-14 NOTE — Assessment & Plan Note (Addendum)
 Metatarsal fracture left likely peroneal tendon injury as well.  CAM Walker given, vitamin D  given secondary to patient's history of osteoporosis.  Discussed that this will take some time to fully heal.  Discussed icing regimen and home exercises.  Increase activity slowly.  Follow-up again in 4 weeks and we should ultrasound at that time as well as likely x-ray to make sure healing is appropriate.  Once weekly vitamin D .

## 2024-07-14 NOTE — Patient Instructions (Signed)
 Ice every 4-6 hours Vit D prescription Try to get foot above heart when sitting and move ankle around See you again in 4 weeks

## 2024-07-15 ENCOUNTER — Ambulatory Visit: Admitting: Sports Medicine

## 2024-07-15 ENCOUNTER — Ambulatory Visit: Admitting: Allergy

## 2024-07-16 ENCOUNTER — Telehealth: Payer: Self-pay | Admitting: Family Medicine

## 2024-07-16 NOTE — Telephone Encounter (Signed)
 Pt called, she took first dose of weekly Vit D and developed a headache. Checked side effects and states this is not listed but wanted Dr. Theressa opinion.  Pt only took 1 dose, did not seem aware this was weekly. I advised her but it may need reiterating.  Also, pt takes OTC Vit D and wonders if she should discontinue. (Did not take yesterday)

## 2024-07-20 ENCOUNTER — Telehealth: Payer: Self-pay

## 2024-07-20 NOTE — Telephone Encounter (Signed)
 Transition Care Management Follow-up Telephone Call Date of discharge and from where: 07/13/24 [facility] How have you been since you were released from the hospital? [facility] Any questions or concerns? No  Items Reviewed: Did the pt receive and understand the discharge instructions provided? Yes  Medications obtained and verified? Yes  Other? No  Any new allergies since your discharge? No  Dietary orders reviewed? No Do you have support at home? Yes   Home Care and Equipment/Supplies: Were home health services ordered? not applicable If so, what is the name of the agency?   Has the agency set up a time to come to the patient's home? not applicable Were any new equipment or medical supplies ordered?  No What is the name of the medical supply agency?  Were you able to get the supplies/equipment? not applicable Do you have any questions related to the use of the equipment or supplies? No  Functional Questionnaire: (I = Independent and D = Dependent) ADLs: I  Bathing/Dressing- I  Meal Prep- I  Eating- I  Maintaining continence- I  Transferring/Ambulation- I  Managing Meds- I  Follow up appointments reviewed:  PCP Hospital f/u appt confirmed? NA  Specialist Hospital f/u appt confirmed? Yes  Scheduled to see Dr Leonce on 08/12/24. Are transportation arrangements needed? No  If their condition worsens, is the pt aware to call PCP or go to the Emergency Dept.? Yes Was the patient provided with contact information for the PCP's office or ED? Yes Was to pt encouraged to call back with questions or concerns? Yes

## 2024-07-20 NOTE — Telephone Encounter (Unsigned)
 Copied from CRM [id number]. Topic: General - Call Back - No Documentation >> Jul 20, 2024  2:53 PM Rea C wrote: Reason for CRM: Patient states to return a phone call for Kela.   Patient contact is [phone] (M).

## 2024-07-22 NOTE — Telephone Encounter (Signed)
 Returned pt call; pt was returning my call after getting first vm left for patient. TOC completed previously,  nothing further needed

## 2024-07-23 ENCOUNTER — Encounter: Payer: Self-pay | Admitting: Allergy

## 2024-07-23 ENCOUNTER — Ambulatory Visit: Admitting: Allergy

## 2024-07-23 ENCOUNTER — Other Ambulatory Visit: Payer: Self-pay

## 2024-07-23 VITALS — BP 138/80 | HR 65 | Temp 98.2°F | Ht 59.0 in | Wt 200.0 lb

## 2024-07-23 DIAGNOSIS — J3089 Other allergic rhinitis: Secondary | ICD-10-CM | POA: Diagnosis not present

## 2024-07-23 DIAGNOSIS — T782XXD Anaphylactic shock, unspecified, subsequent encounter: Secondary | ICD-10-CM

## 2024-07-23 DIAGNOSIS — J302 Other seasonal allergic rhinitis: Secondary | ICD-10-CM

## 2024-07-23 DIAGNOSIS — T63481D Toxic effect of venom of other arthropod, accidental (unintentional), subsequent encounter: Secondary | ICD-10-CM

## 2024-07-23 DIAGNOSIS — J4520 Mild intermittent asthma, uncomplicated: Secondary | ICD-10-CM | POA: Diagnosis not present

## 2024-07-23 DIAGNOSIS — H1013 Acute atopic conjunctivitis, bilateral: Secondary | ICD-10-CM

## 2024-07-23 MED ORDER — FEXOFENADINE HCL 180 MG PO TABS: 180.0000 mg | ORAL_TABLET | Freq: Every day | ORAL | 5 refills | Status: DC

## 2024-07-23 MED ORDER — EPINEPHRINE 0.3 MG/0.3ML IJ SOAJ: 0.3000 mg | Freq: Once | INTRAMUSCULAR | 0 refills | Status: AC | PRN

## 2024-07-23 NOTE — Progress Notes (Signed)
 Follow-up Note  RE: Susan [name] MRN: [id number] DOB: 1956/08/03 Date of Office Visit: 07/23/2024   History of present illness: Susan [name] is a 68 y.o. female presenting today for follow-up of allergic rhinitis with conjunctivitis, hymenoptera allergy , asthma and history of shellfish allergy .  She was last seen in the office on 02/24/2024 by our nurse practitioner Ambs Discussed the use of AI scribe software for clinical note transcription with the patient, who gave verbal consent to proceed.  She has a history of shellfish allergy  and successfully passed a shrimp challenge in March. Since then, she has been incorporating shrimp and shellfish into her diet without any issues. She continues to carry an EpiPen  due to a bee sting allergy , although she has not experienced any bee stings this year.  She has not needed to use her rescue inhaler and has not experienced any respiratory illnesses such as colds. She has access to her albuterol  inhaler, which she keeps on hand despite not needing it recently.  Her sinuses have been fine without symptoms like runny nose, stuffy nose, sneezing, or itchy eyes. She takes Allegra for her allergies and finds it helpful. She has not needed to use eye drops or Singulair  (montelukast ).  She mentions a recent incident where she fractured her foot two weeks ago, which has required her to wear a boot. She accidentally bumped her nose during the night while trying to put her boot on but did not sustain any serious injury.      Review of systems: 10pt ROS negative unless noted above in HPI  Past medical/social/surgical/family history have been reviewed and are unchanged unless specifically indicated below.  No changes  Medication List: Current Outpatient Medications  Medication Sig Dispense Refill   albuterol  (VENTOLIN  HFA) 108 (90 Base) MCG/ACT inhaler INHALE 1 TO 2 PUFFS BY MOUTH INTO THE LUNGS EVERY 6 HOURS AS NEEDED FOR WHEEZING OR  SHORTNESS OF BREATH 8.5 g 0   amLODipine  (NORVASC ) 10 MG tablet Take 1 tablet (10 mg total) by mouth daily. 90 tablet 1   cholecalciferol (VITAMIN D3) 25 MCG (1000 UNIT) tablet Take 5,000 Units by mouth daily.     diclofenac Sodium (VOLTAREN) 1 % GEL Apply 4 g to affected area up to 4 times daily.     EPINEPHrine  0.3 mg/0.3 mL IJ SOAJ injection Inject 0.3 mg into the muscle once as needed (anaphylaxis/allergic reaction). 2 each 0   ezetimibe  (ZETIA ) 10 MG tablet TAKE ONE TABLET BY MOUTH ONCE A DAY 90 tablet 1   fluocinonide  ointment (LIDEX ) 0.05 % Apply 1 Application topically 2 (two) times daily.     spironolactone  (ALDACTONE ) 25 MG tablet Take 1 tablet (25 mg total) by mouth daily. 90 tablet 3   triamterene -hydrochlorothiazide  (MAXZIDE-25) 37.5-25 MG tablet Take 0.5 tablets by mouth daily. 45 tablet 3   Vitamin D , Ergocalciferol , (DRISDOL ) 1.25 MG (50000 UNIT) CAPS capsule Take 1 capsule (50,000 Units total) by mouth every 7 (seven) days. 12 capsule 0   CALCIUM  PO Take 1 tablet by mouth once daily  Indications: osteopenia (Patient not taking: Reported on 07/23/2024)     tirzepatide  (MOUNJARO ) 2.5 MG/0.5ML Pen Inject 2.5 mg into the skin once a week. (Patient not taking: Reported on 07/23/2024) 2 mL 0   No current facility-administered medications for this visit.     Known medication allergies: Allergies  Allergen Reactions   Aspirin     Other reaction(s): Bleeding   Avelox  [Moxifloxacin  Hcl In Nacl] Anaphylaxis  Pt c/o swelling of her throat and tongue   Bee Venom Hives    Other Reaction(s): Angioedema (ALLERGY /intolerance)   Cat Dander Other (See Comments)   Ibuprofen Other (See Comments)    Gastric bleeding Gastric bleeding   Sulfa Antibiotics Hives   Atorvastatin      Muscle aches and memory issues   Codeine Other (See Comments)    REACTION: causes her heart to race   Crestor  [Rosuvastatin  Calcium ]     Pt states INTOLERANT w/ muscle cramps   Penicillins Hives    REACTION:  rash   Pravastatin Sodium [Pravachol]     REACTION: intolerant= headache & insomnia   Simvastatin     REACTION: intolerant= headache & insomnia     Physical examination: Blood pressure 138/80, pulse 65, temperature 98.2 F (36.8 C), temperature source Temporal, height 4' 11 (1.499 m), weight 200 lb (90.7 kg), SpO2 98%.  General: Alert, interactive, in no acute distress. HEENT: PERRLA, TMs pearly gray, turbinates non-edematous without discharge, post-pharynx non erythematous. Neck: Supple without lymphadenopathy. Lungs: Clear to auscultation without wheezing, rhonchi or rales. {no increased work of breathing. CV: Normal S1, S2 without murmurs. Abdomen: Nondistended, nontender. Skin: Warm and dry, without lesions or rashes. Extremities: Wearing boot on left foot  neuro:   Grossly intact.  Diagnostics/Labs: None today  Assessment and plan:   Seasonal Allergic Rhinitis with conjunctivitis Symptoms include sneezing, itchy/watery eyes, sore throat, and ear discomfort, worse in spring and winter.  - continue avoidance measures for dust mites and tree pollen.   - Allegra 180mg  daily.   If needed can change to Xyzal  5mg  or Zyrtec 10mg  daily at this time.  - Can use Pataday  1 drop each eye daily as needed for itchy/watery eyes  Shellfish Allergy  - resolved! -passed shellfish challenge in office.  Continue consuming shellfish in diet at least couple times a month to maintain tolerance  Bee Sting Allergy  Significant localized swelling after bee stings without systemic reactions.  - stinging insect pane was negative.  Continue avoidance of stings.  - Have Epipen  0.3mg  available in case of reaction  Asthma Asthma since childhood with occasional flare-ups, particularly when ill. Recent spirometry was generally normal. - Continue current albuterol  inhaler as needed. have access to albuterol  inhaler 2 puffs every 4-6 hours as needed for cough/wheeze/shortness of breath/chest tightness.   May use 15-20 minutes prior to activity.   Monitor frequency of use.    Asthma control goals:  Full participation in all desired activities (may need albuterol  before activity) Albuterol  use two time or less a week on average (not counting use with activity) Cough interfering with sleep two time or less a month Oral steroids no more than once a year No hospitalizations  Follow-up in 6 months or sooner if needed  I appreciate the opportunity to take part in [place]'s care. Please do not hesitate to contact me with questions.  Sincerely,   Danita Brain, MD Allergy /Immunology [facility]

## 2024-07-23 NOTE — Patient Instructions (Addendum)
 Seasonal Allergic Rhinitis with conjunctivitis Symptoms include sneezing, itchy/watery eyes, sore throat, and ear discomfort, worse in spring and winter.  - continue avoidance measures for dust mites and tree pollen.   - Allegra 180mg  daily.   If needed can change to Xyzal  5mg  or Zyrtec 10mg  daily at this time.  - Can use Pataday  1 drop each eye daily as needed for itchy/watery eyes  Shellfish Allergy  - resolved! -passed shellfish challenge in office.  Continue consuming shellfish in diet at least couple times a month to maintain tolerance  Bee Sting Allergy  Significant localized swelling after bee stings without systemic reactions.  - stinging insect pane was negative.  Continue avoidance of stings.  - Have Epipen  0.3mg  available in case of reaction  Asthma Asthma since childhood with occasional flare-ups, particularly when ill. Recent spirometry was generally normal. - Continue current albuterol  inhaler as needed. have access to albuterol  inhaler 2 puffs every 4-6 hours as needed for cough/wheeze/shortness of breath/chest tightness.  May use 15-20 minutes prior to activity.   Monitor frequency of use.    Asthma control goals:  Full participation in all desired activities (may need albuterol  before activity) Albuterol  use two time or less a week on average (not counting use with activity) Cough interfering with sleep two time or less a month Oral steroids no more than once a year No hospitalizations  Follow-up in 6 months or sooner if needed

## 2024-08-10 NOTE — Progress Notes (Unsigned)
 [name] [address] [place] [address] Phone: [phone] Subjective:   Susan [name] am a scribe for Dr. Claudene.   I'm seeing this patient by the request  of:  Kennyth Worth HERO, MD  CC: Ankle pain follow-up  YEP:Dlagzrupcz  07/14/2024 Metatarsal fracture left likely peroneal tendon injury as well.  CAM Walker given, vitamin D  given secondary to patient's history of osteoporosis.  Discussed that this will take some time to fully heal.  Discussed icing regimen and home exercises.  Increase activity slowly.  Follow-up again in 4 weeks and we should ultrasound at that time as well as likely x-ray to make sure healing is appropriate.  Once weekly vitamin D .     Updated 08/12/2024 Susan [name] is a 68 y.o. female coming in with complaint of foot f/x. Patient states had to stop taking the D2 tablets because she was breaking out on her chest and having headaches. Stopped it about two weeks ago. Left foot is still sore. Hoping to be out of the boot today.        Past Medical History:  Diagnosis Date   Allergy     Anemia    Anxiety state, unspecified    Asthma    Back injury    Eczema    HLD (hyperlipidemia)    intol of statins - follows with lipid clinic   HTN (hypertension)    Irritable bowel syndrome    Lumbosacral spondylosis without myelopathy    improved with PT   Morbid obesity (HCC)    Recurrent upper respiratory infection (URI)    Past Surgical History:  Procedure Laterality Date   ABSCESS DRAINAGE     abdominal   CESAREAN SECTION  10/23/1975   Social History   Socioeconomic History   Marital status: Divorced    Spouse name: Not on file   Number of children: 1   Years of education: college   Highest education level: Not on file  Occupational History   Occupation: Retired    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[employer]    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   Tobacco Use   Smoking status: Never   Smokeless tobacco: Never   Vaping Use   Vaping status: Never Used  Substance and Sexual Activity   Alcohol use: No   Drug use: No   Sexual activity: Not Currently  Other Topics Concern   Not on file  Social History Narrative   Lives alone, divorced since 9   Adult dtr in [place]   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   Social Drivers of Health   Financial Resource Strain: Low Risk  (06/29/2024)   Received from [facility]   Overall Financial Resource Strain (CARDIA)    How hard is it for you to pay for the very basics like food, housing, medical care, and heating?: Not very hard  Food Insecurity: No Food Insecurity (06/29/2024)   Received from [facility]   Hunger Vital Sign    Within the past 12 months, you worried that your food would run out before you got the money to buy more.: Never true    Within the past 12 months, the food you bought just didn't last and you didn't have money to get more.: Never true  Transportation Needs: No Transportation Needs (06/29/2024)   Received from [facility] - Transportation    In the past 12 months, has lack of transportation kept you from medical  appointments or from getting medications?: No    In the past 12 months, has lack of transportation kept you from meetings, work, or from getting things needed for daily living?: No  Physical Activity: Sufficiently Active (06/29/2024)   Received from [facility] [place] [facility]   Exercise Vital Sign    On average, how many days per week do you engage in moderate to strenuous exercise (like a brisk walk)?: 5 days    On average, how many minutes do you engage in exercise at this level?: 60 min  Stress: No Stress Concern Present (06/29/2024)   Received from [facility] - Occupational Stress Questionnaire    Do you feel stress - tense, restless, nervous, or anxious, or unable to sleep at night because your mind is troubled all the time - these days?: Not at all  Social Connections: Socially Integrated  (06/29/2024)   Received from [facility]   Social Network    How would you rate your social network (family, work, friends)?: Good participation with social networks   Allergies  Allergen Reactions   Aspirin     Other reaction(s): Bleeding   Avelox  [Moxifloxacin  Hcl In Nacl] Anaphylaxis    Pt c/o swelling of her throat and tongue   Bee Venom Hives    Other Reaction(s): Angioedema (ALLERGY /intolerance)   Cat Dander Other (See Comments)   Ibuprofen Other (See Comments)    Gastric bleeding Gastric bleeding   Sulfa Antibiotics Hives   Atorvastatin      Muscle aches and memory issues   Codeine Other (See Comments)    REACTION: causes her heart to race   Crestor  [Rosuvastatin  Calcium ]     Pt states INTOLERANT w/ muscle cramps   Penicillins Hives    REACTION: rash   Pravastatin Sodium [Pravachol]     REACTION: intolerant= headache & insomnia   Simvastatin     REACTION: intolerant= headache & insomnia   Family History  Problem Relation Age of Onset   Breast cancer Mother 36 - 45   Diabetes Mother    Hypertension Mother    Multiple myeloma Mother    Diabetes Father        on dialysis   Kidney failure Father    Colon cancer Maternal Grandmother    Eczema Daughter    Allergic rhinitis Other    Asthma Grandchild    Allergic rhinitis Grandchild    Asthma Grandchild    Allergic rhinitis Grandchild    Liver disease Neg Hx    Esophageal cancer Neg Hx     Current Outpatient Medications (Endocrine & Metabolic):    tirzepatide  (MOUNJARO ) 2.5 MG/0.5ML Pen, Inject 2.5 mg into the skin once a week. (Patient not taking: Reported on 07/23/2024)  Current Outpatient Medications (Cardiovascular):    amLODipine  (NORVASC ) 10 MG tablet, Take 1 tablet (10 mg total) by mouth daily.   EPINEPHrine  0.3 mg/0.3 mL IJ SOAJ injection, Inject 0.3 mg into the muscle once as needed (anaphylaxis/allergic reaction).   ezetimibe  (ZETIA ) 10 MG tablet, TAKE ONE TABLET BY MOUTH ONCE A DAY   spironolactone   (ALDACTONE ) 25 MG tablet, Take 1 tablet (25 mg total) by mouth daily.   triamterene -hydrochlorothiazide  (MAXZIDE-25) 37.5-25 MG tablet, Take 0.5 tablets by mouth daily.  Current Outpatient Medications (Respiratory):    albuterol  (VENTOLIN  HFA) 108 (90 Base) MCG/ACT inhaler, INHALE 1 TO 2 PUFFS BY MOUTH INTO THE LUNGS EVERY 6 HOURS AS NEEDED FOR WHEEZING OR SHORTNESS OF BREATH   fexofenadine (ALLEGRA) 180  MG tablet, Take 1 tablet (180 mg total) by mouth daily.    Current Outpatient Medications (Other):    CALCIUM  PO, Take 1 tablet by mouth once daily  Indications: osteopenia (Patient not taking: Reported on 07/23/2024)   cholecalciferol (VITAMIN D3) 25 MCG (1000 UNIT) tablet, Take 5,000 Units by mouth daily.   diclofenac Sodium (VOLTAREN) 1 % GEL, Apply 4 g to affected area up to 4 times daily.   fluocinonide  ointment (LIDEX ) 0.05 %, Apply 1 Application topically 2 (two) times daily.   Vitamin D , Ergocalciferol , (DRISDOL ) 1.25 MG (50000 UNIT) CAPS capsule, Take 1 capsule (50,000 Units total) by mouth every 7 (seven) days.    Objective  Blood pressure 130/70, pulse 79, height 4' 11 (1.499 m), SpO2 99%.   General: No apparent distress alert and oriented x3 mood and affect normal, dressed appropriately.  HEENT: Pupils equal, extraocular movements intact  Respiratory: Patient's speak in full sentences and does not appear short of breath  Cardiovascular: No lower extremity edema, non tender, no erythema  Antalgic gait noted.  Patient was wearing a cam walker though.  Remove cam walker and does have some effusion noted.  Nontender low on exam today with palpation.  Does have some limited range of motion of the ankle secondary to stiffness  Limited muscular skeletal ultrasound was performed and interpreted by Susan HUSSAR, M  Limited ultrasound of patient's foot shows that there is callus formation noted over the anterior and lateral aspect of the fifth metatarsal but having difficulty of the  posterior aspect. Impression: Fifth metatarsal fracture with interval healing   Impression and Recommendations:     The above documentation has been reviewed and is accurate and complete Jerine Surles M Ranette Luckadoo, DO

## 2024-08-12 ENCOUNTER — Other Ambulatory Visit: Payer: Self-pay

## 2024-08-12 ENCOUNTER — Ambulatory Visit (INDEPENDENT_AMBULATORY_CARE_PROVIDER_SITE_OTHER): Admitting: Family Medicine

## 2024-08-12 ENCOUNTER — Encounter: Payer: Self-pay | Admitting: Family Medicine

## 2024-08-12 VITALS — BP 130/70 | HR 79 | Ht 59.0 in

## 2024-08-12 DIAGNOSIS — M79672 Pain in left foot: Secondary | ICD-10-CM | POA: Diagnosis not present

## 2024-08-12 DIAGNOSIS — S92355A Nondisplaced fracture of fifth metatarsal bone, left foot, initial encounter for closed fracture: Secondary | ICD-10-CM

## 2024-08-12 NOTE — Patient Instructions (Addendum)
 Good to see you. Wear the boot out of the house for another month. Ok to wear shoes in the house. Ice it every night. Continue the vitamin D . See me again in 6 to 8 weeks.

## 2024-08-12 NOTE — Assessment & Plan Note (Signed)
 Subsequent visit, healing noted, callus formation noted, discussed icing regimen and home exercises, increase activity slowly.  Able to change to more rigid soled shoes.  Discussed if worsening pain to go back to the boot.  Unable to tolerate the once weekly vitamin D , discussed daily over-the-counter vitamin D  though instead.  Follow-up again in 6 to 8 weeks

## 2024-08-24 ENCOUNTER — Encounter: Admitting: Family Medicine

## 2024-08-24 ENCOUNTER — Other Ambulatory Visit: Payer: Self-pay | Admitting: Family Medicine

## 2024-08-24 DIAGNOSIS — Z1231 Encounter for screening mammogram for malignant neoplasm of breast: Secondary | ICD-10-CM

## 2024-08-27 ENCOUNTER — Ambulatory Visit: Payer: Medicare Other

## 2024-09-03 ENCOUNTER — Telehealth: Payer: Self-pay | Admitting: *Deleted

## 2024-09-03 ENCOUNTER — Encounter: Payer: Self-pay | Admitting: Family Medicine

## 2024-09-03 ENCOUNTER — Ambulatory Visit (INDEPENDENT_AMBULATORY_CARE_PROVIDER_SITE_OTHER): Admitting: Family Medicine

## 2024-09-03 ENCOUNTER — Ambulatory Visit (INDEPENDENT_AMBULATORY_CARE_PROVIDER_SITE_OTHER)

## 2024-09-03 VITALS — BP 124/70 | HR 60 | Temp 97.3°F | Ht 59.0 in | Wt 208.0 lb

## 2024-09-03 VITALS — BP 124/70 | HR 60 | Temp 97.3°F | Ht 59.0 in | Wt 208.6 lb

## 2024-09-03 DIAGNOSIS — Z0000 Encounter for general adult medical examination without abnormal findings: Secondary | ICD-10-CM

## 2024-09-03 DIAGNOSIS — Z0001 Encounter for general adult medical examination with abnormal findings: Secondary | ICD-10-CM

## 2024-09-03 DIAGNOSIS — R739 Hyperglycemia, unspecified: Secondary | ICD-10-CM

## 2024-09-03 DIAGNOSIS — E785 Hyperlipidemia, unspecified: Secondary | ICD-10-CM

## 2024-09-03 DIAGNOSIS — R350 Frequency of micturition: Secondary | ICD-10-CM

## 2024-09-03 DIAGNOSIS — I10 Essential (primary) hypertension: Secondary | ICD-10-CM

## 2024-09-03 LAB — CBC
HCT: 38.7 % (ref 36.0–46.0)
Hemoglobin: 13.1 g/dL (ref 12.0–15.0)
MCHC: 33.8 g/dL (ref 30.0–36.0)
MCV: 91.2 fl (ref 78.0–100.0)
Platelets: 368 K/uL (ref 150.0–400.0)
RBC: 4.25 Mil/uL (ref 3.87–5.11)
RDW: 13.9 % (ref 11.5–15.5)
WBC: 7 K/uL (ref 4.0–10.5)

## 2024-09-03 LAB — COMPREHENSIVE METABOLIC PANEL WITH GFR
ALT: 10 U/L (ref 0–35)
AST: 18 U/L (ref 0–37)
Albumin: 4.5 g/dL (ref 3.5–5.2)
Alkaline Phosphatase: 94 U/L (ref 39–117)
BUN: 15 mg/dL (ref 6–23)
CO2: 29 meq/L (ref 19–32)
Calcium: 9.8 mg/dL (ref 8.4–10.5)
Chloride: 101 meq/L (ref 96–112)
Creatinine, Ser: 0.68 mg/dL (ref 0.40–1.20)
GFR: 88.64 mL/min (ref >60.00)
Glucose, Bld: 83 mg/dL (ref 70–99)
Potassium: 4.3 meq/L (ref 3.5–5.1)
Sodium: 138 meq/L (ref 135–145)
Total Bilirubin: 0.5 mg/dL (ref 0.2–1.2)
Total Protein: 7.7 g/dL (ref 6.0–8.3)

## 2024-09-03 LAB — LIPID PANEL
Cholesterol: 213 mg/dL — ABNORMAL HIGH (ref 0–200)
HDL: 94.3 mg/dL (ref >39.00)
LDL Cholesterol: 105 mg/dL — ABNORMAL HIGH (ref 0–99)
NonHDL: 119.05
Total CHOL/HDL Ratio: 2
Triglycerides: 70 mg/dL (ref 0.0–149.0)
VLDL: 14 mg/dL (ref 0.0–40.0)

## 2024-09-03 LAB — HEMOGLOBIN A1C: Hgb A1c MFr Bld: 5.9 % (ref 4.6–6.5)

## 2024-09-03 LAB — TSH: TSH: 1.38 u[IU]/mL (ref 0.35–5.50)

## 2024-09-03 NOTE — Assessment & Plan Note (Signed)
 Blood pressure much better controlled today.  She has cut out caffeine which seems to have helped significantly.  She is now on spironolactone  25 mg daily and amlodipine  10 mg daily.  Check labs today.  Will continue her current regimen

## 2024-09-03 NOTE — Assessment & Plan Note (Signed)
 Check A1c with labs.  She is working lifestyle modifications.

## 2024-09-03 NOTE — Telephone Encounter (Signed)
 Copied from CRM [id number]. Topic: General - Other >> Sep 03, 2024  1:45 PM Aisha D wrote: Reason for CRM: Pt is calling to inform the office that she will be there next week to pick up the handicap sticker.  Form given to front office  [facility]

## 2024-09-03 NOTE — Progress Notes (Signed)
 Chief Complaint:  Susan [name] is a 68 y.o. female who presents today for her annual comprehensive physical exam.    Assessment/Plan:  New/Acute Problems: Urinary Frequency  No red flags.  No signs of systemic illness.  Check UA and urine culture.  Assessment and Plan Assessment & Plan Foot fracture   Fracture of the fifth toe is managed with a boot. Follow-up is scheduled with sports medicine.    Lower extremity edema   Mild edema is possibly related to amlodipine  and is managed with compression stockings and leg elevation. Continue using compression stockings and advised leg elevation to manage edema.  Chronic Problems Addressed Today: Essential hypertension Blood pressure much better controlled today.  She has cut out caffeine which seems to have helped significantly.  She is now on spironolactone  25 mg daily and amlodipine  10 mg daily.  Check labs today.  Will continue her current regimen  Hyperglycemia Check A1c with labs.  She is working lifestyle modifications.  Dyslipidemia On Zetia  10 mg daily.  Check labs.   Preventative Healthcare: Check labs.  Up-to-date on vaccines.  Due for mammogram later this month.  Due for colonoscopy next year.  Patient Counseling(The following topics were reviewed and/or handout was given):  -Nutrition: Stressed importance of moderation in sodium/caffeine intake, saturated fat and cholesterol, caloric balance, sufficient intake of fresh fruits, vegetables, and fiber.  -Stressed the importance of regular exercise.   -Substance Abuse: Discussed cessation/primary prevention of tobacco, alcohol, or other drug use; driving or other dangerous activities under the influence; availability of treatment for abuse.   -Injury prevention: Discussed safety belts, safety helmets, smoke detector, smoking near bedding or upholstery.   -Sexuality: Discussed sexually transmitted diseases, partner selection, use of condoms, avoidance of unintended  pregnancy and contraceptive alternatives.   -Dental health: Discussed importance of regular tooth brushing, flossing, and dental visits.  -Health maintenance and immunizations reviewed. Please refer to Health maintenance section.  Return to care in 1 year for next preventative visit.     Subjective:  HPI:  She has no acute complaints today. Patient is here today for her annual physical.  See assessment / plan for status of chronic conditions.  Discussed the use of AI scribe software for clinical note transcription with the patient, who gave verbal consent to proceed.  History of Present Illness Susan [name] is a 68 year old female who presents for an annual physical exam.  She has stopped consuming caffeine for the past two months, which has significantly improved her blood pressure. She has also eliminated pork and beef from her diet since 1992. She is currently taking spironolactone  25 mg for blood pressure management.  She reports a sensation of a possible urinary infection, with symptoms of burning during urination for the past two to three days.  She has a history of a foot fracture sustained on July 13, 2024, after a fall. She has been wearing a boot since the injury and will continue to do so until her follow-up appointment on September 24, 2024. An ultrasound was performed for her foot injury.  She has not used her albuterol  inhaler recently and reports no allergic reactions to shellfish or bee stings after recent testing. She has been consuming shrimp without any issues.  She experiences occasional leg swelling, which she manages with compression stockings and leg elevation. She has not been able to exercise due to her foot injury but typically attends the gym regularly.  Her current medications include Zetia  for cholesterol  management, and she reports doing well with it.        09/03/2024    8:12 AM  Depression screen PHQ 2/9  Decreased Interest 0  Down,  Depressed, Hopeless 0  PHQ - 2 Score 0    There are no preventive care reminders to display for this patient.   ROS: Per HPI, otherwise a complete review of systems was negative.   PMH:  The following were reviewed and entered/updated in epic: Past Medical History:  Diagnosis Date   Allergy     Anemia    Anxiety state, unspecified    Asthma    Back injury    Eczema    HLD (hyperlipidemia)    intol of statins - follows with lipid clinic   HTN (hypertension)    Irritable bowel syndrome    Lumbosacral spondylosis without myelopathy    improved with PT   Morbid obesity (HCC)    Recurrent upper respiratory infection (URI)    Patient Active Problem List   Diagnosis Date Noted   Nondisplaced fracture of fifth metatarsal bone, left foot, initial encounter for closed fracture 07/14/2024   Anaphylactic shock due to shellfish 02/24/2024   History of colonic polyps 02/19/2023   Mixed hyperlipidemia 02/19/2023   Allergic rhinitis 02/23/2022   Bee sting allergy  02/23/2022   Constipation 02/23/2022   Hyperglycemia 11/15/2021   Fibroids 01/30/2021   Osteoporosis with last DEXA 11/2022 03/15/2020   Alopecia 08/21/2019   Hemorrhoid 08/21/2019   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   Skin inflammation 04/21/2015   VENOUS INSUFFICIENCY 08/18/2010   SPONDYLOSIS, LUMBAR 01/17/2009   Dyslipidemia 11/26/2007   Morbid obesity (HCC) 11/26/2007   Essential hypertension 11/25/2007   Past Surgical History:  Procedure Laterality Date   ABSCESS DRAINAGE     abdominal   CESAREAN SECTION  10/23/1975    Family History  Problem Relation Age of Onset   Breast cancer Mother 85 - 31   Diabetes Mother    Hypertension Mother    Multiple myeloma Mother    Diabetes Father        on dialysis   Kidney failure Father    Colon cancer Maternal Grandmother    Eczema Daughter    Allergic rhinitis Other    Asthma Grandchild    Allergic rhinitis Grandchild    Asthma Grandchild    Allergic rhinitis Grandchild     Liver disease Neg Hx    Esophageal cancer Neg Hx     Medications- reviewed and updated Current Outpatient Medications  Medication Sig Dispense Refill   albuterol  (VENTOLIN  HFA) 108 (90 Base) MCG/ACT inhaler INHALE 1 TO 2 PUFFS BY MOUTH INTO THE LUNGS EVERY 6 HOURS AS NEEDED FOR WHEEZING OR SHORTNESS OF BREATH 8.5 g 0   amLODipine  (NORVASC ) 10 MG tablet Take 1 tablet (10 mg total) by mouth daily. 90 tablet 1   cholecalciferol (VITAMIN D3) 25 MCG (1000 UNIT) tablet Take 5,000 Units by mouth daily.     diclofenac Sodium (VOLTAREN) 1 % GEL Apply 4 g to affected area up to 4 times daily.     EPINEPHrine  0.3 mg/0.3 mL IJ SOAJ injection Inject 0.3 mg into the muscle once as needed (anaphylaxis/allergic reaction). 2 each 0   ezetimibe  (ZETIA ) 10 MG tablet TAKE ONE TABLET BY MOUTH ONCE A DAY 90 tablet 1   fexofenadine (ALLEGRA) 180 MG tablet Take 1 tablet (180 mg total) by mouth daily. 30 tablet 5   fluocinonide  ointment (LIDEX ) 0.05 % Apply 1 Application topically 2 (two) times  daily.     spironolactone  (ALDACTONE ) 25 MG tablet Take 1 tablet (25 mg total) by mouth daily. 90 tablet 3   Vitamin D , Ergocalciferol , (DRISDOL ) 1.25 MG (50000 UNIT) CAPS capsule Take 1 capsule (50,000 Units total) by mouth every 7 (seven) days. 12 capsule 0   No current facility-administered medications for this visit.    Allergies-reviewed and updated Allergies  Allergen Reactions   Aspirin     Other reaction(s): Bleeding   Avelox  [Moxifloxacin  Hcl In Nacl] Anaphylaxis    Pt c/o swelling of her throat and tongue   Bee Venom Hives    Other Reaction(s): Angioedema (ALLERGY /intolerance)   Cat Dander Other (See Comments)   Ibuprofen Other (See Comments)    Gastric bleeding Gastric bleeding   Sulfa Antibiotics Hives   Atorvastatin      Muscle aches and memory issues   Codeine Other (See Comments)    REACTION: causes her heart to race   Crestor  [Rosuvastatin  Calcium ]     Pt states INTOLERANT w/ muscle cramps    Penicillins Hives    REACTION: rash   Pravastatin Sodium [Pravachol]     REACTION: intolerant= headache & insomnia   Simvastatin     REACTION: intolerant= headache & insomnia    Social History   Socioeconomic History   Marital status: Divorced    Spouse name: Not on file   Number of children: 1   Years of education: college   Highest education level: Some college, no degree  Occupational History   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   Tobacco Use   Smoking status: Never   Smokeless tobacco: Never  Vaping Use   Vaping status: Never Used  Substance and Sexual Activity   Alcohol use: No   Drug use: No   Sexual activity: Not Currently  Other Topics Concern   Not on file  Social History Narrative   Lives alone, divorced since 30   Adult dtr in [place]   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   Social Drivers of Health   Financial Resource Strain: Low Risk  (08/30/2024)   Overall Financial Resource Strain (CARDIA)    Difficulty of Paying Living Expenses: Not hard at all  Food Insecurity: No Food Insecurity (08/30/2024)   Hunger Vital Sign    Worried About Running Out of Food in the Last Year: Never true    Ran Out of Food in the Last Year: Never true  Transportation Needs: No Transportation Needs (08/30/2024)   PRAPARE - Administrator, Civil Service (Medical): No    Lack of Transportation (Non-Medical): No  Physical Activity: Unknown (08/30/2024)   Exercise Vital Sign    Days of Exercise per Week: Patient declined    Minutes of Exercise per Session: Not on file  Recent Concern: Physical Activity - Inactive (08/30/2024)   Exercise Vital Sign    Days of Exercise per Week: Patient declined    Minutes of Exercise per Session: 0 min  Stress: Patient Declined (08/30/2024)   [employer] [facility] - Occupational Stress Questionnaire    Feeling of Stress: Patient declined  Social Connections: Moderately Isolated  (08/30/2024)   Social Connection and Isolation Panel    Frequency of Communication with Friends and Family: More than three times a week    Frequency of Social Gatherings with Friends and Family: Once a week    Attends Religious Services: More than 4 times  per year    Active Member of Clubs or Organizations: No    Attends Engineer, Structural: Not on file    Marital Status: Divorced        Objective:  Physical Exam: BP 124/70   Pulse 60   Temp (!) 97.3 F (36.3 C) (Temporal)   Ht 4' 11 (1.499 m)   Wt 208 lb (94.3 kg)   BMI 42.01 kg/m   Body mass index is 42.01 kg/m. Wt Readings from Last 3 Encounters:  09/03/24 208 lb (94.3 kg)  09/03/24 208 lb 9.6 oz (94.6 kg)  07/23/24 200 lb (90.7 kg)   Gen: NAD, resting comfortably HEENT: TMs normal bilaterally. OP clear. No thyromegaly noted.  CV: RRR with no murmurs appreciated Pulm: NWOB, CTAB with no crackles, wheezes, or rhonchi GI: Normal bowel sounds present. Soft, Nontender, Nondistended. MSK: no edema, cyanosis, or clubbing noted. Left foot in boot.  Skin: warm, dry Neuro: CN2-12 grossly intact. Strength 5/5 in upper and lower extremities. Reflexes symmetric and intact bilaterally.  Psych: Normal affect and thought content     Nate Perri M. Kennyth, MD 09/03/2024 9:46 AM

## 2024-09-03 NOTE — Progress Notes (Signed)
 Chief Complaint  Patient presents with   Medicare Wellness     Subjective:   Susan [name] is a 68 y.o. female who presents for a Medicare Annual Wellness Visit.  Allergies (verified) Aspirin, Avelox  [moxifloxacin  hcl in nacl], Bee venom, Cat dander, Ibuprofen, Sulfa antibiotics, Atorvastatin , Codeine, Crestor  [rosuvastatin  calcium ], Penicillins, Pravastatin sodium [pravachol], and Simvastatin   History: Past Medical History:  Diagnosis Date   Allergy     Anemia    Anxiety state, unspecified    Asthma    Back injury    Eczema    HLD (hyperlipidemia)    intol of statins - follows with lipid clinic   HTN (hypertension)    Irritable bowel syndrome    Lumbosacral spondylosis without myelopathy    improved with PT   Morbid obesity (HCC)    Recurrent upper respiratory infection (URI)    Past Surgical History:  Procedure Laterality Date   ABSCESS DRAINAGE     abdominal   CESAREAN SECTION  10/23/1975   Family History  Problem Relation Age of Onset   Breast cancer Mother 70 - 37   Diabetes Mother    Hypertension Mother    Multiple myeloma Mother    Diabetes Father        on dialysis   Kidney failure Father    Colon cancer Maternal Grandmother    Eczema Daughter    Allergic rhinitis Other    Asthma Grandchild    Allergic rhinitis Grandchild    Asthma Grandchild    Allergic rhinitis Grandchild    Liver disease Neg Hx    Esophageal cancer Neg Hx    Social History   Occupational History   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   Tobacco Use   Smoking status: Never   Smokeless tobacco: Never  Vaping Use   Vaping status: Never Used  Substance and Sexual Activity   Alcohol use: No   Drug use: No   Sexual activity: Not Currently   Tobacco Counseling Counseling given: Not Answered  SDOH Screenings   Food Insecurity: No Food Insecurity (08/30/2024)  Housing: Low Risk  (08/30/2024)  Transportation  Needs: No Transportation Needs (08/30/2024)  Utilities: Not At Risk (09/03/2024)  Alcohol Screen: Low Risk  (07/16/2022)  Depression (PHQ2-9): Low Risk  (09/03/2024)  Financial Resource Strain: Low Risk  (08/30/2024)  Physical Activity: Unknown (08/30/2024)  Recent Concern: Physical Activity - Inactive (08/30/2024)  Social Connections: Moderately Isolated (08/30/2024)  Stress: Patient Declined (08/30/2024)  Tobacco Use: Low Risk  (09/03/2024)  Health Literacy: Adequate Health Literacy (09/03/2024)   See flowsheets for full screening details  Depression Screen PHQ 2 & 9 Depression Scale- Over the past 2 weeks, how often have you been bothered by any of the following problems? Little interest or pleasure in doing things: 0 Feeling down, depressed, or hopeless (PHQ Adolescent also includes...irritable): 0 PHQ-2 Total Score: 0     Goals Addressed               This Visit's Progress     lose weight (pt-stated)        Weight lose        Visit info / Clinical Intake: Medicare Wellness Visit Type:: Subsequent Annual Wellness Visit Persons participating in visit:: patient Medicare Wellness Visit Mode:: In-person (required for WTM) Information given by:: patient Interpreter Needed?: No Pre-visit prep was completed: yes AWV questionnaire completed by patient prior to  visit?: yes Date:: 08/30/24 Living arrangements:: (!) lives alone Patient's Overall Health Status Rating: good Typical amount of pain: none Does pain affect daily life?: no Are you currently prescribed opioids?: no  Dietary Habits and Nutritional Risks How many meals a day?: 3 Eats fruit and vegetables daily?: yes Most meals are obtained by: preparing own meals Diabetic:: no  Functional Status Activities of Daily Living (to include ambulation/medication): (Patient-Rptd) Independent Ambulation: Independent with device- listed below Home Assistive Devices/Equipment: Brace (specify type); Eyeglasses (left foot  brace) Home Management: (Patient-Rptd) Independent Manage your own finances?: yes Primary transportation is: driving Concerns about vision?: no *vision screening is required for WTM* Concerns about hearing?: no  Fall Screening Falls in the past year?: (Patient-Rptd) 1 Number of falls in past year: (Patient-Rptd) 1 Was there an injury with Fall?: 1 (fracture left foot) Fall Risk Category Calculator: (Patient-Rptd) 3 Patient Fall Risk Level: (Patient-Rptd) High Fall Risk  Fall Risk Patient at Risk for Falls Due to: History of fall(s) Fall risk Follow up: Falls prevention discussed  Home and Transportation Safety: All rugs have non-skid backing?: (!) no (kitchen rug) All stairs or steps have railings?: N/A, no stairs Grab bars in the bathtub or shower?: yes Have non-skid surface in bathtub or shower?: yes Good home lighting?: yes Regular seat belt use?: yes Hospital stays in the last year:: no  Cognitive Assessment Difficulty concentrating, remembering, or making decisions? : no Will 6CIT or Mini Cog be Completed: no 6CIT or Mini Cog Declined: patient alert, oriented, able to answer questions appropriately and recall recent events  Advance Directives (For Healthcare) Does Patient Have a Medical Advance Directive?: Yes Type of Advance Directive: Living will Copy of Living Will in Chart?: No - copy requested  Reviewed/Updated  Reviewed/Updated: Reviewed All (Medical, Surgical, Family, Medications, Allergies, Care Teams, Patient Goals)        Objective:    Today's Vitals   09/03/24 0802  BP: 124/70  Pulse: 60  Temp: (!) 97.3 F (36.3 C)  SpO2: 97%  Weight: 208 lb 9.6 oz (94.6 kg)  Height: 4' 11 (1.499 m)   Body mass index is 42.13 kg/m.  Current Medications (verified) Outpatient Encounter Medications as of 09/03/2024  Medication Sig   albuterol  (VENTOLIN  HFA) 108 (90 Base) MCG/ACT inhaler INHALE 1 TO 2 PUFFS BY MOUTH INTO THE LUNGS EVERY 6 HOURS AS NEEDED FOR  WHEEZING OR SHORTNESS OF BREATH   amLODipine  (NORVASC ) 10 MG tablet Take 1 tablet (10 mg total) by mouth daily.   cholecalciferol (VITAMIN D3) 25 MCG (1000 UNIT) tablet Take 5,000 Units by mouth daily.   diclofenac Sodium (VOLTAREN) 1 % GEL Apply 4 g to affected area up to 4 times daily.   EPINEPHrine  0.3 mg/0.3 mL IJ SOAJ injection Inject 0.3 mg into the muscle once as needed (anaphylaxis/allergic reaction).   ezetimibe  (ZETIA ) 10 MG tablet TAKE ONE TABLET BY MOUTH ONCE A DAY   fexofenadine (ALLEGRA) 180 MG tablet Take 1 tablet (180 mg total) by mouth daily.   fluocinonide  ointment (LIDEX ) 0.05 % Apply 1 Application topically 2 (two) times daily.   spironolactone  (ALDACTONE ) 25 MG tablet Take 1 tablet (25 mg total) by mouth daily.   Vitamin D , Ergocalciferol , (DRISDOL ) 1.25 MG (50000 UNIT) CAPS capsule Take 1 capsule (50,000 Units total) by mouth every 7 (seven) days.   [DISCONTINUED] CALCIUM  PO Take 1 tablet by mouth once daily  Indications: osteopenia (Patient not taking: Reported on 07/23/2024)   [DISCONTINUED] tirzepatide  (MOUNJARO ) 2.5 MG/0.5ML Pen Inject  2.5 mg into the skin once a week. (Patient not taking: Reported on 07/23/2024)   [DISCONTINUED] triamterene -hydrochlorothiazide  (MAXZIDE-25) 37.5-25 MG tablet Take 0.5 tablets by mouth daily.   No facility-administered encounter medications on file as of 09/03/2024.   Hearing/Vision screen Hearing Screening - Comments:: Pt denies any hearing issues  Vision Screening - Comments:: Wears rx glasses - up to date with routine eye exams [facility]  Immunizations and Health Maintenance Health Maintenance  Topic Date Due   Colonoscopy  03/22/2023   COVID-19 Vaccine (7 - 2025-26 season) 06/22/2024   DEXA SCAN  11/27/2024   Medicare Annual Wellness (AWV)  09/03/2025   Mammogram  10/03/2025   DTaP/Tdap/Td (7 - Td or Tdap) 02/26/2026   Pneumococcal Vaccine: 50+ Years  Completed   Influenza Vaccine  Completed    Hepatitis C Screening  Completed   Meningococcal B Vaccine  Aged Out   Zoster Vaccines- Shingrix  Discontinued        Assessment/Plan:  This is a routine wellness examination for Susan [name].  Patient Care Team: Kennyth Worth HERO, MD as PCP - General (Family Medicine) Tess Agent, MD (Sports Medicine) Rollin Dover, MD (Gastroenterology) Gorge Ade, MD (Obstetrics and Gynecology)  I have personally reviewed and noted the following in the patient's chart:   Medical and social history Use of alcohol, tobacco or illicit drugs  Current medications and supplements including opioid prescriptions. Functional ability and status Nutritional status Physical activity Advanced directives List of other physicians Hospitalizations, surgeries, and ER visits in previous 12 months Vitals Screenings to include cognitive, depression, and falls Referrals and appointments  No orders of the defined types were placed in this encounter.  In addition, I have reviewed and discussed with patient certain preventive protocols, quality metrics, and best practice recommendations. A written personalized care plan for preventive services as well as general preventive health recommendations were provided to patient.   Susan VEAR Haws, LPN   88/86/7974   Return in 1 year (on 09/03/2025).  After Visit Summary: (In Person-Printed) AVS printed and given to the patient  Nurse Notes: nothing significant at this time. Pt stated 10 years for colonoscopy

## 2024-09-03 NOTE — Assessment & Plan Note (Signed)
 On Zetia  10 mg daily.  Check labs.

## 2024-09-03 NOTE — Patient Instructions (Signed)
 Susan [name],  Thank you for taking the time for your Medicare Wellness Visit. I appreciate your continued commitment to your health goals. Please review the care plan we discussed, and feel free to reach out if I can assist you further.  Please note that Annual Wellness Visits do not include a physical exam. Some assessments may be limited, especially if the visit was conducted virtually. If needed, we may recommend an in-person follow-up with your provider.  Ongoing Care Seeing your primary care provider every 3 to 6 months helps us  monitor your health and provide consistent, personalized care.   Referrals If a referral was made during today's visit and you haven't received any updates within two weeks, please contact the referred provider directly to check on the status.  Recommended Screenings:  Health Maintenance  Topic Date Due   Colon Cancer Screening  03/22/2023   COVID-19 Vaccine (7 - 2025-26 season) 06/22/2024   Medicare Annual Wellness Visit  08/21/2024   DEXA scan (bone density measurement)  11/27/2024   Breast Cancer Screening  10/03/2025   DTaP/Tdap/Td vaccine (7 - Td or Tdap) 02/26/2026   Pneumococcal Vaccine for age over 60  Completed   Flu Shot  Completed   Hepatitis C Screening  Completed   Meningitis B Vaccine  Aged Out   Zoster (Shingles) Vaccine  Discontinued       12/25/2023    3:40 PM  Advanced Directives  Does Patient Have a Medical Advance Directive? Yes  Type of Advance Directive Living will;Healthcare Power of Attorney    Vision: Annual vision screenings are recommended for early detection of glaucoma, cataracts, and diabetic retinopathy. These exams can also reveal signs of chronic conditions such as diabetes and high blood pressure.  Dental: Annual dental screenings help detect early signs of oral cancer, gum disease, and other conditions linked to overall health, including heart disease and diabetes.  Please see the attached documents for  additional preventive care recommendations.

## 2024-09-03 NOTE — Patient Instructions (Signed)
 It was very nice to see you today!  VISIT SUMMARY: Today, you had your annual physical exam. Your blood pressure is well-controlled, and your cholesterol medication is working well. We discussed your foot fracture, leg swelling, and possible urinary tract infection.  YOUR PLAN: ESSENTIAL HYPERTENSION: Your blood pressure is well-controlled with your current medication. -Continue taking spironolactone  25 mg daily. -Continue taking amlodipine  as prescribed. -We ordered blood work to check your kidney function and other relevant parameters.  HYPERLIPIDEMIA: Your cholesterol levels are being managed well with your current medication. -Continue taking ezetimibe  as prescribed.  FOOT FRACTURE: You have a fracture in your fifth toe and are currently wearing a boot. -Continue wearing the boot until the end of November. -Follow up with the orthopedic specialist on December 4th, 2025.  LOWER EXTREMITY EDEMA: You have mild swelling in your legs, possibly related to your medication. -Continue using compression stockings. -Elevate your legs to help manage the swelling.  SUSPECTED URINARY TRACT INFECTION: You have symptoms that suggest a urinary tract infection. -We ordered a urinalysis to check for a urinary tract infection.  GENERAL HEALTH MAINTENANCE: Routine health maintenance and screenings. -Ensure you complete your mammogram next month. -Schedule your colonoscopy for next year. -We provided you with a handicap placard renewal form.  Return in about 1 year (around 09/03/2025) for Annual Physical.   Take care, Dr Kennyth  PLEASE NOTE:  If you had any lab tests, please let us  know if you have not heard back within a few days. You may see your results on mychart before we have a chance to review them but we will give you a call once they are reviewed by us .   If we ordered any referrals today, please let us  know if you have not heard from their office within the next week.   If you had  any urgent prescriptions sent in today, please check with the pharmacy within an hour of our visit to make sure the prescription was transmitted appropriately.   Please try these tips to maintain a healthy lifestyle:  Eat at least 3 REAL meals and 1-2 snacks per day.  Aim for no more than 5 hours between eating.  If you eat breakfast, please do so within one hour of getting up.   Each meal should contain half fruits/vegetables, one quarter protein, and one quarter carbs (no bigger than a computer mouse)  Cut down on sweet beverages. This includes juice, soda, and sweet tea.   Drink at least 1 glass of water with each meal and aim for at least 8 glasses per day  Exercise at least 150 minutes every week.

## 2024-09-07 ENCOUNTER — Ambulatory Visit: Payer: Self-pay | Admitting: Family Medicine

## 2024-09-07 LAB — URINE CULTURE
MICRO NUMBER:: 17231347
SPECIMEN QUALITY:: ADEQUATE

## 2024-09-07 NOTE — Progress Notes (Signed)
 Her urine culture is positive for urinary tract infection by 2 different bacteria.  Both of these could be treated with Bactrim however we cannot use this due to her sulfa allergy  and so we will have to treat with 2 different antibiotics.  Recommend ciprofloxacin 250 mg twice daily for 3 days and Macrobid 100 mg twice daily for 5 days. She can come back in 1-2 weeks to recheck

## 2024-09-09 ENCOUNTER — Other Ambulatory Visit: Payer: Self-pay | Admitting: *Deleted

## 2024-09-09 MED ORDER — FOSFOMYCIN TROMETHAMINE 3 G PO PACK: 3.0000 g | PACK | Freq: Once | ORAL | 0 refills | Status: AC

## 2024-09-22 ENCOUNTER — Other Ambulatory Visit (INDEPENDENT_AMBULATORY_CARE_PROVIDER_SITE_OTHER)

## 2024-09-22 ENCOUNTER — Ambulatory Visit: Payer: Self-pay | Admitting: Family Medicine

## 2024-09-22 ENCOUNTER — Other Ambulatory Visit: Payer: Self-pay | Admitting: *Deleted

## 2024-09-22 DIAGNOSIS — R300 Dysuria: Secondary | ICD-10-CM

## 2024-09-22 DIAGNOSIS — N390 Urinary tract infection, site not specified: Secondary | ICD-10-CM

## 2024-09-22 LAB — URINALYSIS, ROUTINE W REFLEX MICROSCOPIC
Bilirubin Urine: NEGATIVE
Hgb urine dipstick: NEGATIVE
Ketones, ur: NEGATIVE
Leukocytes,Ua: NEGATIVE
Nitrite: NEGATIVE
Specific Gravity, Urine: 1.015 (ref 1.000–1.030)
Total Protein, Urine: NEGATIVE
Urine Glucose: NEGATIVE
Urobilinogen, UA: 0.2 (ref 0.0–1.0)
pH: 6.5 (ref 5.0–8.0)

## 2024-09-22 NOTE — Progress Notes (Signed)
 Her UA is clear but can we add on a urine culture?

## 2024-09-22 NOTE — Progress Notes (Unsigned)
 [name] [address] [place] [address] Phone: [phone] Subjective:   ISusannah [name], am serving as a scribe for Dr. Arthea Claudene.  I'm seeing this patient by the request  of:  Kennyth Worth HERO, MD  CC: Left foot pain  YEP:Dlagzrupcz  08/12/2024 Subsequent visit, healing noted, callus formation noted, discussed icing regimen and home exercises, increase activity slowly.  Able to change to more rigid soled shoes.  Discussed if worsening pain to go back to the boot.  Unable to tolerate the once weekly vitamin D , discussed daily over-the-counter vitamin D  though instead.  Follow-up again in 6 to 8 weeks      Update 09/24/2024 Susan [name] is a 68 y.o. female coming in with complaint of L foot pain.  Found to have a nondisplaced fracture of the fifth metatarsal.  Patient states has been out of the boot since end of Nov. Only minor pain. No other symptoms.      Past Medical History:  Diagnosis Date   Allergy     Anemia    Anxiety state, unspecified    Asthma    Back injury    Eczema    HLD (hyperlipidemia)    intol of statins - follows with lipid clinic   HTN (hypertension)    Irritable bowel syndrome    Lumbosacral spondylosis without myelopathy    improved with PT   Morbid obesity (HCC)    Recurrent upper respiratory infection (URI)    Past Surgical History:  Procedure Laterality Date   ABSCESS DRAINAGE     abdominal   CESAREAN SECTION  10/23/1975   Social History   Socioeconomic History   Marital status: Divorced    Spouse name: Not on file   Number of children: 1   Years of education: college   Highest education level: Some college, no degree  Occupational History   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   Tobacco Use   Smoking status: Never   Smokeless tobacco: Never  Vaping Use   Vaping status: Never Used  Substance and Sexual Activity   Alcohol  use: No   Drug use: No   Sexual activity: Not Currently  Other Topics Concern   Not on file  Social History Narrative   Lives alone, divorced since 89   Adult dtr in [place]   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   Social Drivers of Health   Financial Resource Strain: Low Risk  (08/30/2024)   Overall Financial Resource Strain (CARDIA)    Difficulty of Paying Living Expenses: Not hard at all  Food Insecurity: No Food Insecurity (08/30/2024)   Hunger Vital Sign    Worried About Running Out of Food in the Last Year: Never true    Ran Out of Food in the Last Year: Never true  Transportation Needs: No Transportation Needs (08/30/2024)   PRAPARE - Administrator, Civil Service (Medical): No    Lack of Transportation (Non-Medical): No  Physical Activity: Unknown (08/30/2024)   Exercise Vital Sign    Days of Exercise per Week: Patient declined    Minutes of Exercise per Session: Not on file  Recent Concern: Physical Activity - Inactive (08/30/2024)   Exercise Vital Sign    Days of Exercise per Week: Patient declined    Minutes of Exercise per Session: 0 min  Stress: Patient Declined (08/30/2024)   [employer]  of Occupational Health - Occupational Stress Questionnaire    Feeling of Stress: Patient declined  Social Connections: Moderately Isolated (08/30/2024)   Social Connection and Isolation Panel    Frequency of Communication with Friends and Family: More than three times a week    Frequency of Social Gatherings with Friends and Family: Once a week    Attends Religious Services: More than 4 times per year    Active Member of [employer] or Organizations: No    Attends Engineer, Structural: Not on file    Marital Status: Divorced   Allergies  Allergen Reactions   Aspirin     Other reaction(s): Bleeding   Avelox  [Moxifloxacin  Hcl In Nacl] Anaphylaxis    Pt c/o swelling of her throat and tongue   Bee Venom Hives    Other Reaction(s): Angioedema (ALLERGY /intolerance)   Cat Dander  Other (See Comments)   Ibuprofen Other (See Comments)    Gastric bleeding Gastric bleeding   Sulfa Antibiotics Hives   Atorvastatin      Muscle aches and memory issues   Codeine Other (See Comments)    REACTION: causes her heart to race   Crestor  [Rosuvastatin  Calcium ]     Pt states INTOLERANT w/ muscle cramps   Penicillins Hives    REACTION: rash   Pravastatin Sodium [Pravachol]     REACTION: intolerant= headache & insomnia   Simvastatin     REACTION: intolerant= headache & insomnia   Family History  Problem Relation Age of Onset   Breast cancer Mother 22 - 76   Diabetes Mother    Hypertension Mother    Multiple myeloma Mother    Diabetes Father        on dialysis   Kidney failure Father    Colon cancer Maternal Grandmother    Eczema Daughter    Allergic rhinitis Other    Asthma Grandchild    Allergic rhinitis Grandchild    Asthma Grandchild    Allergic rhinitis Grandchild    Liver disease Neg Hx    Esophageal cancer Neg Hx      Current Outpatient Medications (Cardiovascular):    amLODipine  (NORVASC ) 10 MG tablet, Take 1 tablet (10 mg total) by mouth daily.   EPINEPHrine  0.3 mg/0.3 mL IJ SOAJ injection, Inject 0.3 mg into the muscle once as needed (anaphylaxis/allergic reaction).   ezetimibe  (ZETIA ) 10 MG tablet, TAKE ONE TABLET BY MOUTH ONCE A DAY   spironolactone  (ALDACTONE ) 25 MG tablet, Take 1 tablet (25 mg total) by mouth daily.  Current Outpatient Medications (Respiratory):    albuterol  (VENTOLIN  HFA) 108 (90 Base) MCG/ACT inhaler, INHALE 1 TO 2 PUFFS BY MOUTH INTO THE LUNGS EVERY 6 HOURS AS NEEDED FOR WHEEZING OR SHORTNESS OF BREATH   fexofenadine  (ALLEGRA ) 180 MG tablet, Take 1 tablet (180 mg total) by mouth daily.    Current Outpatient Medications (Other):    cholecalciferol (VITAMIN D3) 25 MCG (1000 UNIT) tablet, Take 5,000 Units by mouth daily.   diclofenac Sodium (VOLTAREN) 1 % GEL, Apply 4 g to affected area up to 4 times daily.   fluocinonide   ointment (LIDEX ) 0.05 %, Apply 1 Application topically 2 (two) times daily.   Vitamin D , Ergocalciferol , (DRISDOL ) 1.25 MG (50000 UNIT) CAPS capsule, Take 1 capsule (50,000 Units total) by mouth every 7 (seven) days.   Reviewed prior external information including notes and imaging from  primary care provider As well as notes that were available from care everywhere and other healthcare systems.  Past medical history, social,  surgical and family history all reviewed in electronic medical record.  No pertanent information unless stated regarding to the chief complaint.   Review of Systems:  No headache, visual changes, nausea, vomiting, diarrhea, constipation, dizziness, abdominal pain, skin rash, fevers, chills, night sweats, weight loss, swollen lymph nodes, body aches, joint swelling, chest pain, shortness of breath, mood changes. POSITIVE muscle aches  Objective  Blood pressure 118/62, pulse 60, height 4' 11 (1.499 m), SpO2 96%.   General: No apparent distress alert and oriented x3 mood and affect normal, dressed appropriately.  HEENT: Pupils equal, extraocular movements intact  Respiratory: Patient's speak in full sentences and does not appear short of breath  Cardiovascular: No lower extremity edema, non tender, no erythema  Foot exam shows on the left side no significant swelling noted to the.  Patient does have mild pitting edema noted of the dorsal aspect of the foot.  Very minimal discomfort noted over the fifth metatarsal but significant improvement.  Limited muscular skeletal ultrasound was performed and interpreted by CLAUDENE HUSSAR, M  Limited ultrasound shows that there is a very significant amount of callus formation noted over the fifth metatarsal.  Still seems to be extra-articular.  Mild hypoechoic changes in the soft tissue consistent with the edema we noted on exam. Impression: Interval improvement with hard callus formation over the fifth metatarsal    Impression and  Recommendations:     The above documentation has been reviewed and is accurate and complete Deja Pisarski M Bruin Bolger, DO

## 2024-09-23 ENCOUNTER — Ambulatory Visit

## 2024-09-23 DIAGNOSIS — R300 Dysuria: Secondary | ICD-10-CM

## 2024-09-24 ENCOUNTER — Encounter: Payer: Self-pay | Admitting: Family Medicine

## 2024-09-24 ENCOUNTER — Ambulatory Visit: Admitting: Family Medicine

## 2024-09-24 ENCOUNTER — Other Ambulatory Visit: Payer: Self-pay

## 2024-09-24 ENCOUNTER — Ambulatory Visit

## 2024-09-24 VITALS — BP 118/62 | HR 60 | Ht 59.0 in

## 2024-09-24 DIAGNOSIS — S92355A Nondisplaced fracture of fifth metatarsal bone, left foot, initial encounter for closed fracture: Secondary | ICD-10-CM | POA: Diagnosis not present

## 2024-09-24 DIAGNOSIS — M79672 Pain in left foot: Secondary | ICD-10-CM | POA: Diagnosis not present

## 2024-09-24 LAB — URINE CULTURE
MICRO NUMBER:: 17307381
SPECIMEN QUALITY:: ADEQUATE

## 2024-09-24 NOTE — Patient Instructions (Signed)
 Looks fantastic Xray today Keep wearing the good shoes Have an amazing birthday See you again in 2 months

## 2024-09-24 NOTE — Assessment & Plan Note (Signed)
 Appears to have a hard callus formation noted on ultrasound.  Significant bridging also noted on x-ray.  Does not seem to be full at this time.  Discussed rigid soled shoes, icing regimen, patient is having very minimal discomfort at all at this time but still has some swelling.  We discussed lymphatic drainage and how using muscles can help decrease it.  Follow-up again in 6 to 8 weeks

## 2024-09-25 ENCOUNTER — Other Ambulatory Visit: Payer: Self-pay | Admitting: Family Medicine

## 2024-09-25 ENCOUNTER — Ambulatory Visit: Payer: Self-pay | Admitting: Family Medicine

## 2024-09-25 NOTE — Progress Notes (Signed)
Urine culture is negative for UTI.

## 2024-09-28 ENCOUNTER — Ambulatory Visit: Admitting: Family Medicine

## 2024-09-28 ENCOUNTER — Other Ambulatory Visit: Payer: Self-pay

## 2024-09-28 ENCOUNTER — Encounter: Payer: Self-pay | Admitting: Family Medicine

## 2024-09-28 VITALS — BP 115/60 | HR 80 | Temp 98.2°F | Resp 16

## 2024-09-28 DIAGNOSIS — J302 Other seasonal allergic rhinitis: Secondary | ICD-10-CM | POA: Insufficient documentation

## 2024-09-28 DIAGNOSIS — J4521 Mild intermittent asthma with (acute) exacerbation: Secondary | ICD-10-CM | POA: Insufficient documentation

## 2024-09-28 DIAGNOSIS — J3089 Other allergic rhinitis: Secondary | ICD-10-CM

## 2024-09-28 DIAGNOSIS — Z880 Allergy status to penicillin: Secondary | ICD-10-CM | POA: Insufficient documentation

## 2024-09-28 DIAGNOSIS — J019 Acute sinusitis, unspecified: Secondary | ICD-10-CM

## 2024-09-28 DIAGNOSIS — H1013 Acute atopic conjunctivitis, bilateral: Secondary | ICD-10-CM | POA: Insufficient documentation

## 2024-09-28 DIAGNOSIS — Z91038 Other insect allergy status: Secondary | ICD-10-CM

## 2024-09-28 DIAGNOSIS — B9689 Other specified bacterial agents as the cause of diseases classified elsewhere: Secondary | ICD-10-CM

## 2024-09-28 MED ORDER — DOXYCYCLINE HYCLATE 100 MG PO TABS: 100.0000 mg | ORAL_TABLET | Freq: Two times a day (BID) | ORAL | 0 refills | Status: AC

## 2024-09-28 MED ORDER — FLUTICASONE PROPIONATE HFA 110 MCG/ACT IN AERO: INHALATION_SPRAY | RESPIRATORY_TRACT | 5 refills | Status: AC

## 2024-09-28 MED ORDER — PREDNISONE 10 MG PO TABS: ORAL_TABLET | ORAL | 0 refills | Status: DC

## 2024-09-28 NOTE — Progress Notes (Signed)
 [address] [place] [address] Dept: [phone]  FOLLOW UP NOTE  Patient ID: Susan [name], female    DOB: 1956-03-26  Age: 68 y.o. MRN: [id number] Date of Office Visit: 09/28/2024  Assessment  Chief Complaint: Follow-up (Pt states coughing up green phlegm, SHOB, taking medicine and it wasn't helping...)  HPI Susan [name] is a 68 year old female who presents to the clinic for an acute visit. She was last seen in this clinic on 07/23/2024 by Dr. Jeneal for evaluation of asthma, allergic rhinitis, allergic conjunctivitis, stinging insect allergy , and possible food allergy . She completed a successful in office shrimp challenge on 02/24/2024.   Discussed the use of AI scribe software for clinical note transcription with the patient, who gave verbal consent to proceed.  History of Present Illness Susan [name] is a 68 year old female who presents with wheezing, coughing, and shortness of breath.  Her symptoms began on Wednesday or Thursday after visiting a place on Monday where she encountered an unclean individual. By Wednesday, she started feeling unwell, and by Thursday, she experienced severe coughing, wheezing, and shortness of breath. She has been using her albuterol  inhaler every four to six hours, which provides minimal relief of her asthma symptoms.  She describes her cough as productive with green sputum. No runny nose, but she reports nasal stuffiness with clear discharge when blowing her nose. She experiences drainage in the back of her throat, which is sore, and has been using throat lozenges without significant improvement.  No fever, but she reports chills, which she attributes to the cold weather. She reports that she has been experiencing aching in her back and right leg over that last day and this has mostly resolved at today's visit. She has not been to the gym since September due to a foot fracture.  Allergic rhinitis is reported as moderately  well-controlled with occasional nasal congestion with clear drainage and postnasal drainage.  She continues an antihistamine once a day and is not currently using Flonase  or nasal saline rinses.  Her last environmental allergy  testing on 01/10/2024 via lab work indicates allergy  to dust mite and tree pollen.  Allergic conjunctivitis is reported as moderately well-controlled with occasional red eyes for which she is not currently using any medical intervention.  She continues to avoid stinging insects with no accidental stings or EpiPen  use since her last visit to this clinic.  She reports that years ago she developed hives and local swelling after an insect sting.  She did have a negative hymenoptera panel on 01/10/2024.  She reports her EpiPen  is out of date and will be refilled at today's visit. .  She is no longer avoiding shellfish or any foods at this time.  Her current medications are listed in the chart.   Drug Allergies:  Allergies  Allergen Reactions   Aspirin     Other reaction(s): Bleeding   Avelox  [Moxifloxacin  Hcl In Nacl] Anaphylaxis    Pt c/o swelling of her throat and tongue   Bee Venom Hives    Other Reaction(s): Angioedema (ALLERGY /intolerance)   Cat Dander Other (See Comments)   Ibuprofen Other (See Comments)    Gastric bleeding Gastric bleeding   Sulfa Antibiotics Hives   Atorvastatin      Muscle aches and memory issues   Codeine Other (See Comments)    REACTION: causes her heart to race   Crestor  [Rosuvastatin  Calcium ]     Pt states INTOLERANT w/ muscle cramps   Penicillins  Hives    REACTION: rash   Pravastatin Sodium [Pravachol]     REACTION: intolerant= headache & insomnia   Simvastatin     REACTION: intolerant= headache & insomnia    Physical Exam: BP 115/60 (BP Location: Right Arm, Patient Position: Sitting, Cuff Size: Large)   Pulse 80   Temp 98.2 F (36.8 C) (Temporal)   Resp 16   SpO2 100%    Physical Exam Vitals reviewed.  Constitutional:       Appearance: Normal appearance.  HENT:     Head: Normocephalic and atraumatic.     Right Ear: Tympanic membrane normal.     Left Ear: Tympanic membrane normal.     Nose:     Comments: Bilateral nares edematous and pale with thin clear nasal drainage noted.  Pharynx normal.  Ears normal.  Eyes normal.    Mouth/Throat:     Pharynx: Oropharynx is clear.  Eyes:     Conjunctiva/sclera: Conjunctivae normal.  Cardiovascular:     Rate and Rhythm: Normal rate and regular rhythm.     Heart sounds: Normal heart sounds. No murmur heard. Pulmonary:     Effort: Pulmonary effort is normal.     Breath sounds: Normal breath sounds.     Comments: Lungs clear to auscultation Musculoskeletal:        General: Normal range of motion.     Cervical back: Normal range of motion and neck supple.  Skin:    General: Skin is warm and dry.  Neurological:     Mental Status: She is alert and oriented to person, place, and time.  Psychiatric:        Mood and Affect: Mood normal.        Behavior: Behavior normal.        Thought Content: Thought content normal.        Judgment: Judgment normal.     Diagnostics: FVC 2.09 which is 106% of predicted value, FEV1 1.71 which is 110% of predicted value.  Spirometry indicates normal ventilatory function  Assessment and Plan: 1. Poorly controlled intermittent asthma with acute exacerbation   2. Acute bacterial sinusitis   3. Seasonal and perennial allergic rhinitis   4. Allergic conjunctivitis of both eyes   5. Hymenoptera allergy    6. Penicillin allergy      Meds ordered this encounter  Medications   doxycycline  (VIBRA -TABS) 100 MG tablet    Sig: Take 1 tablet (100 mg total) by mouth 2 (two) times daily for 10 days.    Dispense:  20 tablet    Refill:  0   predniSONE  (DELTASONE ) 10 MG tablet    Sig: Begin prednisone  10 mg tablets. Take 2 tablets twice a day for 3 days, then take 2 tablets once a day for 1 day, then take 1 tablet on the 5th day, then stop     Dispense:  15 tablet    Refill:  0   fluticasone  (FLOVENT  HFA) 110 MCG/ACT inhaler    Sig: For asthma flare, begin Flovent  110-2 puffs twice a day with a spacer for 1 to 2 weeks or until cough and wheeze free.    Dispense:  1 each    Refill:  5    Patient Instructions  Asthma Continue albuterol  2 puffs once every 4 hours if needed for cough or wheeze You may use albuterol  2 puffs 5 to 15 minutes before activity to decrease cough or wheeze Begin prednisone  10 mg tablets. Take 2 tablets twice a day for 3  days, then take 2 tablets once a day for 1 day, then take 1 tablet on the 5th day, then stop  For now and for asthma flare, begin Flovent  110-2 puffs twice a day for 1-2 weeks or until cough and wheeze free  Acute bacterial sinusitis Begin Doxycycline  100 mg twice a day fr the next 10 days Begin nasal saline rinses followed by Flonase  nasal spray 2 sprays in each nostril once a day if needed for a stuffy nose Continue Mucinex [phone] mg twice a day for the next 10 days and increase fluid intake to thin out mucus  Allergic rhinitis Continue allergen avoidance measures Continue an antihistamine once a day if needed for runny nose or itch.  You may take an additional dose of histamine once a day if needed for breakthrough symptoms Consider saline nasal rinses as needed for nasal symptoms. Use this before any medicated nasal sprays for best result  Allergic conjunctivitis Some over the counter eye drops include Pataday  one drop in each eye once a day as needed for red, itchy eyes OR Zaditor one drop in each eye twice a day as needed for red itchy eyes. Avoid eye drops that say red eye relief as they may contain medications that dry out your eyes.   Stinging insect allergy  Continue to avoid stinging insects.  Negative lab work to hymenoptera panel..  In case of an allergic reaction, take cetirizine 10 mg once every 12-24 hours, and if life-threatening symptoms occur, inject with EpiPen  0.3  mg.   Penicillin allergy  Consider penicillin skin testing and challenge. If successful, we will remove penicillin from your allergy  lest.   Call the clinic if this treatment plan is not working well for you.  Follow up in 1 month or sooner if needed.  Return in about 4 weeks (around 10/26/2024), or if symptoms worsen or fail to improve.    Thank you for the opportunity to care for this patient.  Please do not hesitate to contact me with questions.  Arlean Mutter, FNP [facility] 

## 2024-09-28 NOTE — Patient Instructions (Addendum)
 Asthma Continue albuterol  2 puffs once every 4 hours if needed for cough or wheeze You may use albuterol  2 puffs 5 to 15 minutes before activity to decrease cough or wheeze Begin prednisone  10 mg tablets. Take 2 tablets twice a day for 3 days, then take 2 tablets once a day for 1 day, then take 1 tablet on the 5th day, then stop  For now and for asthma flare, begin Flovent  110-2 puffs twice a day for 1-2 weeks or until cough and wheeze free  Acute bacterial sinusitis Begin Doxycycline  100 mg twice a day fr the next 10 days Begin nasal saline rinses followed by Flonase  nasal spray 2 sprays in each nostril once a day if needed for a stuffy nose Continue Mucinex [phone] mg twice a day for the next 10 days and increase fluid intake to thin out mucus  Allergic rhinitis Continue allergen avoidance measures Continue an antihistamine once a day if needed for runny nose or itch.  You may take an additional dose of histamine once a day if needed for breakthrough symptoms Consider saline nasal rinses as needed for nasal symptoms. Use this before any medicated nasal sprays for best result  Allergic conjunctivitis Some over the counter eye drops include Pataday  one drop in each eye once a day as needed for red, itchy eyes OR Zaditor one drop in each eye twice a day as needed for red itchy eyes. Avoid eye drops that say red eye relief as they may contain medications that dry out your eyes.   Stinging insect allergy  Continue to avoid stinging insects.  Negative lab work to hymenoptera panel..  In case of an allergic reaction, take cetirizine 10 mg once every 12-24 hours, and if life-threatening symptoms occur, inject with EpiPen  0.3 mg.   Penicillin allergy  Consider penicillin skin testing and challenge. If successful, we will remove penicillin from your allergy  lest.   Call the clinic if this treatment plan is not working well for you.  Follow up in 1 month or sooner if needed.

## 2024-09-30 ENCOUNTER — Ambulatory Visit: Payer: Self-pay | Admitting: Family Medicine

## 2024-10-05 ENCOUNTER — Ambulatory Visit

## 2024-10-16 ENCOUNTER — Ambulatory Visit
Admission: RE | Admit: 2024-10-16 | Discharge: 2024-10-16 | Disposition: A | Discharge status: Home / Self Care | Source: Ambulatory Visit | Attending: Family Medicine | Admitting: Family Medicine

## 2024-10-16 DIAGNOSIS — Z1231 Encounter for screening mammogram for malignant neoplasm of breast: Secondary | ICD-10-CM

## 2024-10-30 ENCOUNTER — Ambulatory Visit: Admitting: Allergy

## 2024-11-02 ENCOUNTER — Ambulatory Visit: Admitting: Family Medicine

## 2024-11-03 ENCOUNTER — Telehealth: Payer: Self-pay | Admitting: *Deleted

## 2024-11-03 NOTE — Telephone Encounter (Signed)
 Copied from CRM #[id number]. Topic: Clinical - Medication Question >> Nov 03, 2024 10:09 AM Charolett L wrote: Reason for CRM: Patient called in and stated that the medication amLODipine  (NORVASC ) 10 MG tablet is causing her gums to swell. She's requesting a call back to see if she can get a higher dosage of a counter medication.   Please advise  Greydon Betke,RMA

## 2024-11-04 NOTE — Telephone Encounter (Signed)
 Please schedule an OV with PCP for swelling gums

## 2024-11-04 NOTE — Telephone Encounter (Signed)
 Can we have her come in for an appointment to discuss? This is not a typical side effect.

## 2024-11-05 LAB — OPHTHALMOLOGY REPORT-SCANNED

## 2024-11-10 ENCOUNTER — Encounter: Payer: Self-pay | Admitting: Family Medicine

## 2024-11-10 ENCOUNTER — Ambulatory Visit: Admitting: Family Medicine

## 2024-11-10 VITALS — BP 136/80 | HR 57 | Temp 98.6°F | Ht 59.0 in | Wt 214.6 lb

## 2024-11-10 DIAGNOSIS — M79672 Pain in left foot: Secondary | ICD-10-CM

## 2024-11-10 DIAGNOSIS — M810 Age-related osteoporosis without current pathological fracture: Secondary | ICD-10-CM | POA: Diagnosis not present

## 2024-11-10 DIAGNOSIS — I10 Essential (primary) hypertension: Secondary | ICD-10-CM

## 2024-11-10 MED ORDER — SPIRONOLACTONE 25 MG PO TABS: 37.5000 mg | ORAL_TABLET | Freq: Every day | ORAL | 3 refills | Status: AC

## 2024-11-10 NOTE — Assessment & Plan Note (Signed)
 Blood pressure is very well-controlled today however she is having some issues with gingival hyperplasia.  She discussed this with her dentist who told her it was probably coming from her amlodipine  and she would like to switch off of this today.  We did discuss trying a lower dose however she would like to come off this completely.  Will discontinue this today.  Will increase her spironolactone  to 37.5 mg daily as well.  She will follow-up with us  in a few weeks via MyChart.  She has not done well with higher doses of spironolactone  to 50 mg daily in the past.  If blood pressure is not at goal with her spironolactone  as above would consider HCTZ.

## 2024-11-10 NOTE — Assessment & Plan Note (Signed)
 Will order bone density scan.  She is due next month.

## 2024-11-10 NOTE — Assessment & Plan Note (Signed)
 Continue management per sports medicine.  Will place referral today per insurance request.

## 2024-11-10 NOTE — Patient Instructions (Signed)
 It was very nice to see you today!  VISIT SUMMARY: Today we discussed your gum swelling, which is likely a side effect of your blood pressure medication, amlodipine . We have made adjustments to your medication to address this issue.  YOUR PLAN: GUM SWELLING: You have been experiencing gum swelling, which is likely due to your blood pressure medication, amlodipine . -We have discontinued amlodipine  and increased your spironolactone  dose to 37.5 mg daily. -A new prescription for spironolactone  has been sent to your pharmacy. -Please monitor your blood pressure at home and report your readings in a few weeks.  Return if symptoms worsen or fail to improve.   Take care, Dr Kennyth  PLEASE NOTE:  If you had any lab tests, please let us  know if you have not heard back within a few days. You may see your results on mychart before we have a chance to review them but we will give you a call once they are reviewed by us .   If we ordered any referrals today, please let us  know if you have not heard from their office within the next week.   If you had any urgent prescriptions sent in today, please check with the pharmacy within an hour of our visit to make sure the prescription was transmitted appropriately.   Please try these tips to maintain a healthy lifestyle:  Eat at least 3 REAL meals and 1-2 snacks per day.  Aim for no more than 5 hours between eating.  If you eat breakfast, please do so within one hour of getting up.   Each meal should contain half fruits/vegetables, one quarter protein, and one quarter carbs (no bigger than a computer mouse)  Cut down on sweet beverages. This includes juice, soda, and sweet tea.   Drink at least 1 glass of water with each meal and aim for at least 8 glasses per day  Exercise at least 150 minutes every week.

## 2024-11-10 NOTE — Progress Notes (Signed)
" ° °  Susan [name] is a 69 y.o. female who presents today for an office visit.  Assessment/Plan:   Chronic Problems Addressed Today: Essential hypertension Blood pressure is very well-controlled today however she is having some issues with gingival hyperplasia.  She discussed this with her dentist who told her it was probably coming from her amlodipine  and she would like to switch off of this today.  We did discuss trying a lower dose however she would like to come off this completely.  Will discontinue this today.  Will increase her spironolactone  to 37.5 mg daily as well.  She will follow-up with us  in a few weeks via MyChart.  She has not done well with higher doses of spironolactone  to 50 mg daily in the past.  If blood pressure is not at goal with her spironolactone  as above would consider HCTZ.  Left foot pain Continue management per sports medicine.  Will place referral today per insurance request.  Osteoporosis with last DEXA 11/2022 Will order bone density scan.  She is due next month.     Subjective:  HPI:  See assessment / plan for status of chronic conditions.    Discussed the use of AI scribe software for clinical note transcription with the patient, who gave verbal consent to proceed.  History of Present Illness Susan [name] is a 69 year old female with hypertension who presents with gum swelling.  She has been experiencing gum swelling, which she attributes to her current blood pressure medication, amlodipine . The swelling was noticed at her dental visit last week, where her dentist told her that gum swelling could be a side effect of the medication.  She is currently taking amlodipine  and spironolactone  for hypertension. She previously experienced side effects such as headaches and dizziness when her spironolactone  dose was increased to 50 mg. She has since reduced caffeine intake, which she believes has positively impacted her blood pressure.  She monitors  her blood pressure at home daily, reporting readings such as 128/97, 127/71, and 124/73. She notes that her blood pressure readings vary depending on the time of day.  She has a follow-up appointment with an orthopedic specialist for a foot injury. She initially saw one specialist but switched to another when the first was unavailable. She requires a referral for insurance purposes despite having seen the current specialist previously.  She has recently returned to the gym, engaging in activities such as cycling and leg exercises, although she is cautious due to her foot injury. She has reduced her exercise from five miles to four miles per session.  No leg swelling or other side effects from amlodipine  aside from gum swelling.         Objective:  Physical Exam: BP 136/80   Pulse (!) 57   Temp 98.6 F (37 C) (Temporal)   Ht 4' 11 (1.499 m)   Wt 214 lb 9.6 oz (97.3 kg)   SpO2 98%   BMI 43.34 kg/m   Gen: No acute distress, resting comfortably CV: Regular rate and rhythm with no murmurs appreciated Pulm: Normal work of breathing, clear to auscultation bilaterally with no crackles, wheezes, or rhonchi Neuro: Grossly normal, moves all extremities Psych: Normal affect and thought content      Ayzia Day M. Kennyth, MD 11/10/2024 10:07 AM  "

## 2024-11-11 ENCOUNTER — Encounter: Payer: Self-pay | Admitting: Family Medicine

## 2024-11-17 NOTE — Telephone Encounter (Signed)
 LVM with [facility] for bone density appt

## 2024-11-17 NOTE — Telephone Encounter (Signed)
 Ok to refer to [facility] but we can also try referring to the [place] office on [address] to see if they anything available sooner.

## 2024-11-18 NOTE — Telephone Encounter (Signed)
 Noted.

## 2024-11-19 NOTE — Progress Notes (Signed)
 " [facility] [address] [place] [address] Phone: [phone] Subjective:   Susan [name], am serving as a scribe for Dr. Arthea Claudene.  I'm seeing this patient by the request  of:  Kennyth Worth HERO, MD  CC: left foot pain   YEP:Dlagzrupcz  09/24/2024 Appears to have a hard callus formation noted on ultrasound.  Significant bridging also noted on x-ray.  Does not seem to be full at this time.  Discussed rigid soled shoes, icing regimen, patient is having very minimal discomfort at all at this time but still has some swelling.  We discussed lymphatic drainage and how using muscles can help decrease it.  Follow-up again in 6 to 8 weeks      Update 11/25/2024 Susan [name] is a 69 y.o. female coming in with complaint of L foot pain. Patient states that she is making progress.     Since we have seen patient patient has followed up with pain management.  They did discuss the possibility of newer medications as well as potentially sacroiliac injections.  Patient is scheduled for a bone density on the 11th of this month  Previous x-rays did show an oblique subacute fracture distal to the base of the fifth metatarsal on December 10.  Past Medical History:  Diagnosis Date   Allergy     Anemia    Anxiety state, unspecified    Asthma    Back injury    Eczema    HLD (hyperlipidemia)    intol of statins - follows with lipid clinic   HTN (hypertension)    Irritable bowel syndrome    Lumbosacral spondylosis without myelopathy    improved with PT   Morbid obesity (HCC)    Recurrent upper respiratory infection (URI)    Past Surgical History:  Procedure Laterality Date   ABSCESS DRAINAGE     abdominal   CESAREAN SECTION  10/23/1975   Social History   Socioeconomic History   Marital status: Divorced    Spouse name: Not on file   Number of children: 1   Years of education: college   Highest education level: Some college, no degree   Occupational History   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   Tobacco Use   Smoking status: Never   Smokeless tobacco: Never  Vaping Use   Vaping status: Never Used  Substance and Sexual Activity   Alcohol use: No   Drug use: No   Sexual activity: Not Currently  Other Topics Concern   Not on file  Social History Narrative   Lives alone, divorced since 57   Adult dtr in [place]   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   Social Drivers of Health   Tobacco Use: Low Risk (11/10/2024)   Patient History    Smoking Tobacco Use: Never    Smokeless Tobacco Use: Never    Passive Exposure: Not on file  Financial Resource Strain: Low Risk (08/30/2024)   Overall Financial Resource Strain (CARDIA)    Difficulty of Paying Living Expenses: Not hard at all  Food Insecurity: No Food Insecurity (08/30/2024)   Epic    Worried About Radiation Protection Practitioner of Food in the Last Year: Never true    Ran Out of Food in the Last Year: Never true  Transportation Needs: No Transportation Needs (08/30/2024)   Epic    Lack of Transportation (Medical): No  Lack of Transportation (Non-Medical): No  Physical Activity: Unknown (08/30/2024)   Exercise Vital Sign    Days of Exercise per Week: Patient declined    Minutes of Exercise per Session: Not on file  Recent Concern: Physical Activity - Inactive (08/30/2024)   Exercise Vital Sign    Days of Exercise per Week: Patient declined    Minutes of Exercise per Session: 0 min  Stress: Patient Declined (08/30/2024)   [employer] of Occupational Health - Occupational Stress Questionnaire    Feeling of Stress: Patient declined  Social Connections: Moderately Isolated (08/30/2024)   Social Connection and Isolation Panel    Frequency of Communication with Friends and Family: More than three times a week    Frequency of Social Gatherings with Friends and Family: Once a week    Attends Religious Services: More than 4 times per year     Active Member of Clubs or Organizations: No    Attends Engineer, Structural: Not on file    Marital Status: Divorced  Depression (PHQ2-9): Low Risk (11/10/2024)   Depression (PHQ2-9)    PHQ-2 Score: 0  Alcohol Screen: Low Risk (07/16/2022)   Alcohol Screen    Last Alcohol Screening Score (AUDIT): 0  Housing: Low Risk (08/30/2024)   Epic    Unable to Pay for Housing in the Last Year: No    Number of Times Moved in the Last Year: 0    Homeless in the Last Year: No  Utilities: Not At Risk (09/03/2024)   Epic    Threatened with loss of utilities: No  Health Literacy: Adequate Health Literacy (09/03/2024)   B1300 Health Literacy    Frequency of need for help with medical instructions: Never   Allergies[1] Family History  Problem Relation Age of Onset   Breast cancer Mother 38 - 77   Diabetes Mother    Hypertension Mother    Multiple myeloma Mother    Diabetes Father        on dialysis   Kidney failure Father    Colon cancer Maternal Grandmother    Eczema Daughter    Allergic rhinitis Other    Asthma Grandchild    Allergic rhinitis Grandchild    Asthma Grandchild    Allergic rhinitis Grandchild    Liver disease Neg Hx    Esophageal cancer Neg Hx     Current Outpatient Medications (Cardiovascular):    EPINEPHrine  0.3 mg/0.3 mL IJ SOAJ injection, Inject 0.3 mg into the muscle once as needed (anaphylaxis/allergic reaction).   ezetimibe  (ZETIA ) 10 MG tablet, TAKE ONE TABLET BY MOUTH ONCE A DAY   spironolactone  (ALDACTONE ) 25 MG tablet, Take 1.5 tablets (37.5 mg total) by mouth daily.  Current Outpatient Medications (Respiratory):    albuterol  (VENTOLIN  HFA) 108 (90 Base) MCG/ACT inhaler, INHALE 1 TO 2 PUFFS BY MOUTH INTO THE LUNGS EVERY 6 HOURS AS NEEDED FOR WHEEZING OR SHORTNESS OF BREATH   fluticasone  (FLOVENT  HFA) 110 MCG/ACT inhaler, For asthma flare, begin Flovent  110-2 puffs twice a day with a spacer for 1 to 2 weeks or until cough and wheeze free.  Current  Outpatient Medications (Other):    cholecalciferol (VITAMIN D3) 25 MCG (1000 UNIT) tablet, Take 5,000 Units by mouth daily.   fluocinonide  ointment (LIDEX ) 0.05 %, Apply 1 Application topically 2 (two) times daily.   Objective  Blood pressure 126/72, pulse 66, height 4' 11 (1.499 m), SpO2 99%.   General: No apparent distress alert and oriented x3 mood and affect normal, dressed  appropriately.  HEENT: Pupils equal, extraocular movements intact  Respiratory: Patient's speak in full sentences and does not appear short of breath  Cardiovascular: No lower extremity edema, non tender, no erythema  Left foot exam very minimal tenderness just distal to the fifth metatarsal area. Patient has good range of motion of the ankle noted otherwise.  Limited muscular skeletal ultrasound was performed and interpreted by CLAUDENE HUSSAR, M  Limited ultrasound of patient's left foot shows that there is an improvement again with increasing callus formation noted.  This seems to be more of a hard callus than previous exam but still not fully. Impression: Interval improvement    Impression and Recommendations:    The above documentation has been reviewed and is accurate and complete Hussar CHRISTELLA Claudene, DO        [1]  Allergies Allergen Reactions   Aspirin     Other reaction(s): Bleeding   Avelox  [Moxifloxacin  Hcl In Nacl] Anaphylaxis    Pt c/o swelling of her throat and tongue   Bee Venom Hives    Other Reaction(s): Angioedema (ALLERGY /intolerance)   Cat Dander Other (See Comments)   Ibuprofen Other (See Comments)    Gastric bleeding Gastric bleeding   Sulfa Antibiotics Hives   Atorvastatin      Muscle aches and memory issues   Codeine Other (See Comments)    REACTION: causes her heart to race   Crestor  [Rosuvastatin  Calcium ]     Pt states INTOLERANT w/ muscle cramps   Penicillins Hives    REACTION: rash   Pravastatin Sodium [Pravachol]     REACTION: intolerant= headache & insomnia    Simvastatin     REACTION: intolerant= headache & insomnia   "

## 2024-11-25 ENCOUNTER — Other Ambulatory Visit: Payer: Self-pay

## 2024-11-25 ENCOUNTER — Ambulatory Visit: Admitting: Family Medicine

## 2024-11-25 ENCOUNTER — Encounter: Payer: Self-pay | Admitting: Family Medicine

## 2024-11-25 VITALS — BP 126/72 | HR 66 | Ht 59.0 in

## 2024-11-25 DIAGNOSIS — M79672 Pain in left foot: Secondary | ICD-10-CM

## 2024-11-25 NOTE — Patient Instructions (Signed)
 Susan [name] Keep doing Vit D Keep wearing the good shoes See you again in 2 months for one more check up

## 2024-11-27 NOTE — Telephone Encounter (Signed)
 See BP records

## 2024-12-02 ENCOUNTER — Other Ambulatory Visit

## 2025-01-21 ENCOUNTER — Ambulatory Visit: Admitting: Allergy

## 2025-01-25 ENCOUNTER — Ambulatory Visit: Admitting: Family Medicine

## 2025-09-13 ENCOUNTER — Encounter: Admitting: Family Medicine

## 2025-09-13 ENCOUNTER — Ambulatory Visit
